# Patient Record
Sex: Female | Born: 1970
Health system: Southern US, Community
[De-identification: ages and names within clinical notes are randomized; demographics above are authoritative.]

## PROBLEM LIST (undated history)

## (undated) DIAGNOSIS — B20 Human immunodeficiency virus [HIV] disease: Secondary | ICD-10-CM

## (undated) DIAGNOSIS — F909 Attention-deficit hyperactivity disorder, unspecified type: Secondary | ICD-10-CM

## (undated) DIAGNOSIS — G2581 Restless legs syndrome: Secondary | ICD-10-CM

## (undated) DIAGNOSIS — M797 Fibromyalgia: Secondary | ICD-10-CM

## (undated) DIAGNOSIS — R519 Headache, unspecified: Secondary | ICD-10-CM

## (undated) DIAGNOSIS — T7840XA Allergy, unspecified, initial encounter: Secondary | ICD-10-CM

## (undated) DIAGNOSIS — E079 Disorder of thyroid, unspecified: Secondary | ICD-10-CM

## (undated) DIAGNOSIS — E559 Vitamin D deficiency, unspecified: Secondary | ICD-10-CM

## (undated) DIAGNOSIS — E039 Hypothyroidism, unspecified: Secondary | ICD-10-CM

## (undated) DIAGNOSIS — G40909 Epilepsy, unspecified, not intractable, without status epilepticus: Secondary | ICD-10-CM

## (undated) DIAGNOSIS — F329 Major depressive disorder, single episode, unspecified: Secondary | ICD-10-CM

## (undated) DIAGNOSIS — F4310 Post-traumatic stress disorder, unspecified: Secondary | ICD-10-CM

## (undated) DIAGNOSIS — G4700 Insomnia, unspecified: Secondary | ICD-10-CM

## (undated) DIAGNOSIS — F331 Major depressive disorder, recurrent, moderate: Secondary | ICD-10-CM

## (undated) DIAGNOSIS — I10 Essential (primary) hypertension: Secondary | ICD-10-CM

## (undated) DIAGNOSIS — F32A Depression, unspecified: Secondary | ICD-10-CM

## (undated) DIAGNOSIS — Z21 Asymptomatic human immunodeficiency virus [HIV] infection status: Secondary | ICD-10-CM

## (undated) DIAGNOSIS — D649 Anemia, unspecified: Secondary | ICD-10-CM

## (undated) DIAGNOSIS — F419 Anxiety disorder, unspecified: Secondary | ICD-10-CM

## (undated) DIAGNOSIS — G43109 Migraine with aura, not intractable, without status migrainosus: Secondary | ICD-10-CM

## (undated) DIAGNOSIS — R51 Headache: Secondary | ICD-10-CM

## (undated) DIAGNOSIS — F411 Generalized anxiety disorder: Secondary | ICD-10-CM

## (undated) DIAGNOSIS — E119 Type 2 diabetes mellitus without complications: Secondary | ICD-10-CM

## (undated) HISTORY — DX: Anemia, unspecified: D64.9

## (undated) HISTORY — DX: Generalized anxiety disorder: F41.1

## (undated) HISTORY — DX: Attention-deficit hyperactivity disorder, unspecified type: F90.9

## (undated) HISTORY — DX: Fibromyalgia: M79.7

## (undated) HISTORY — DX: Major depressive disorder, recurrent, moderate: F33.1

## (undated) HISTORY — DX: Headache: R51

## (undated) HISTORY — DX: Vitamin D deficiency, unspecified: E55.9

## (undated) HISTORY — PX: EYE SURGERY: SHX253

## (undated) HISTORY — DX: Restless legs syndrome: G25.81

## (undated) HISTORY — DX: Depression, unspecified: F32.A

## (undated) HISTORY — DX: Type 2 diabetes mellitus without complications: E11.9

## (undated) HISTORY — DX: Human immunodeficiency virus (HIV) disease: B20

## (undated) HISTORY — DX: Disorder of thyroid, unspecified: E07.9

## (undated) HISTORY — DX: Insomnia, unspecified: G47.00

## (undated) HISTORY — DX: Major depressive disorder, single episode, unspecified: F32.9

## (undated) HISTORY — DX: Asymptomatic human immunodeficiency virus (hiv) infection status: Z21

## (undated) HISTORY — DX: Migraine with aura, not intractable, without status migrainosus: G43.109

## (undated) HISTORY — DX: Essential (primary) hypertension: I10

## (undated) HISTORY — DX: Anxiety disorder, unspecified: F41.9

## (undated) HISTORY — DX: Epilepsy, unspecified, not intractable, without status epilepticus: G40.909

## (undated) HISTORY — DX: Headache, unspecified: R51.9

## (undated) HISTORY — PX: TUBAL LIGATION: SHX77

## (undated) HISTORY — DX: Allergy, unspecified, initial encounter: T78.40XA

## (undated) HISTORY — DX: Post-traumatic stress disorder, unspecified: F43.10

---

## 2006-07-26 ENCOUNTER — Observation Stay: Payer: Self-pay | Admitting: Obstetrics and Gynecology

## 2006-08-01 ENCOUNTER — Observation Stay: Payer: Self-pay | Admitting: Obstetrics and Gynecology

## 2006-08-08 ENCOUNTER — Observation Stay: Payer: Self-pay | Admitting: Obstetrics and Gynecology

## 2006-08-16 ENCOUNTER — Observation Stay: Payer: Self-pay | Admitting: Obstetrics and Gynecology

## 2006-08-22 ENCOUNTER — Observation Stay: Payer: Self-pay | Admitting: Obstetrics and Gynecology

## 2006-08-27 ENCOUNTER — Inpatient Hospital Stay: Payer: Self-pay | Admitting: Obstetrics and Gynecology

## 2007-09-12 ENCOUNTER — Other Ambulatory Visit: Payer: Self-pay

## 2007-09-12 ENCOUNTER — Emergency Department: Payer: Self-pay | Admitting: Unknown Physician Specialty

## 2009-12-25 ENCOUNTER — Emergency Department: Payer: Self-pay | Admitting: Emergency Medicine

## 2010-02-08 ENCOUNTER — Emergency Department: Payer: Self-pay | Admitting: Emergency Medicine

## 2010-03-21 ENCOUNTER — Ambulatory Visit: Payer: Self-pay | Admitting: Family Medicine

## 2010-10-31 ENCOUNTER — Ambulatory Visit: Payer: Self-pay | Admitting: Family Medicine

## 2010-10-31 LAB — HM MAMMOGRAPHY: HM Mammogram: NORMAL

## 2011-01-28 ENCOUNTER — Emergency Department: Payer: Self-pay | Admitting: Internal Medicine

## 2012-10-29 ENCOUNTER — Emergency Department: Payer: Self-pay | Admitting: Emergency Medicine

## 2012-11-19 ENCOUNTER — Emergency Department: Payer: Self-pay | Admitting: Emergency Medicine

## 2012-11-19 LAB — COMPREHENSIVE METABOLIC PANEL
Alkaline Phosphatase: 133 U/L (ref 50–136)
Anion Gap: 6 — ABNORMAL LOW (ref 7–16)
Chloride: 104 mmol/L (ref 98–107)
Creatinine: 0.62 mg/dL (ref 0.60–1.30)
EGFR (African American): >60
EGFR (Non-African Amer.): >60
Glucose: 132 mg/dL — ABNORMAL HIGH (ref 65–99)
Osmolality: 276 (ref 275–301)
Potassium: 3.4 mmol/L — ABNORMAL LOW (ref 3.5–5.1)
SGPT (ALT): 30 U/L (ref 12–78)

## 2012-11-19 LAB — CBC
MCV: 80 fL (ref 80–100)
Platelet: 272 10*3/uL (ref 150–440)
RBC: 3.94 10*6/uL (ref 3.80–5.20)
RDW: 16.9 % — ABNORMAL HIGH (ref 11.5–14.5)
WBC: 8.4 10*3/uL (ref 3.6–11.0)

## 2012-11-19 LAB — PRO B NATRIURETIC PEPTIDE: B-Type Natriuretic Peptide: 89 pg/mL (ref 0–125)

## 2012-11-19 LAB — CK TOTAL AND CKMB (NOT AT ARMC): CK, Total: 85 U/L (ref 21–215)

## 2013-01-14 ENCOUNTER — Other Ambulatory Visit: Payer: Self-pay | Admitting: Neonatology

## 2013-01-14 ENCOUNTER — Other Ambulatory Visit (HOSPITAL_COMMUNITY): Payer: Self-pay | Admitting: Rheumatology

## 2013-01-14 DIAGNOSIS — M2550 Pain in unspecified joint: Secondary | ICD-10-CM

## 2013-01-14 DIAGNOSIS — M79671 Pain in right foot: Secondary | ICD-10-CM

## 2013-01-14 DIAGNOSIS — M25549 Pain in joints of unspecified hand: Secondary | ICD-10-CM

## 2013-01-15 ENCOUNTER — Telehealth: Payer: Self-pay | Admitting: Oncology

## 2013-01-15 NOTE — Telephone Encounter (Signed)
LVOM FOR PT TO RETURN CALL.  °

## 2013-01-21 ENCOUNTER — Telehealth: Payer: Self-pay | Admitting: Oncology

## 2013-01-21 NOTE — Telephone Encounter (Signed)
S/w pt in re NP appt 05/21 @ 3 w/Dr. Gaylyn Rong Referring Dr. Pollyann Savoy Dx- Elevated ESR Welcome packet mailed.

## 2013-01-26 ENCOUNTER — Encounter (HOSPITAL_COMMUNITY)
Admission: RE | Admit: 2013-01-26 | Discharge: 2013-01-26 | Disposition: A | Discharge status: Home / Self Care | Payer: BC Managed Care – PPO | Source: Ambulatory Visit | Attending: Rheumatology | Admitting: Rheumatology

## 2013-01-26 DIAGNOSIS — M79609 Pain in unspecified limb: Secondary | ICD-10-CM | POA: Insufficient documentation

## 2013-01-26 DIAGNOSIS — M79671 Pain in right foot: Secondary | ICD-10-CM

## 2013-01-26 DIAGNOSIS — M2550 Pain in unspecified joint: Secondary | ICD-10-CM

## 2013-01-26 MED ORDER — TECHNETIUM TC 99M MEDRONATE IV KIT
24.0000 | PACK | Freq: Once | INTRAVENOUS | Status: AC | PRN
  Administered 2013-01-26: 24 via INTRAVENOUS

## 2013-01-27 ENCOUNTER — Telehealth: Payer: Self-pay | Admitting: Oncology

## 2013-01-27 NOTE — Telephone Encounter (Signed)
C/D 01/27/13 for appt. 02/11/13 °

## 2013-02-10 ENCOUNTER — Telehealth: Payer: Self-pay | Admitting: Oncology

## 2013-02-10 NOTE — Telephone Encounter (Signed)
LVOM FOR PT TO RETURN CALL TO CONFIRM MESSAGE WAS RECEIVED IN RE TO NP APPT R/S.

## 2013-02-11 ENCOUNTER — Ambulatory Visit: Payer: BC Managed Care – PPO

## 2013-02-11 ENCOUNTER — Ambulatory Visit: Payer: BC Managed Care – PPO | Admitting: Oncology

## 2013-02-11 ENCOUNTER — Other Ambulatory Visit: Payer: BC Managed Care – PPO | Admitting: Lab

## 2013-02-12 ENCOUNTER — Telehealth: Payer: Self-pay | Admitting: Oncology

## 2013-02-12 NOTE — Telephone Encounter (Signed)
S/W PT IN RE TO APPT D/T CHANGE TO 06/18 @ 1:30. NEW CALENDAR MAIELD.

## 2013-03-06 ENCOUNTER — Other Ambulatory Visit: Payer: Self-pay | Admitting: Oncology

## 2013-03-06 DIAGNOSIS — D729 Disorder of white blood cells, unspecified: Secondary | ICD-10-CM

## 2013-03-11 ENCOUNTER — Encounter: Payer: Self-pay | Admitting: Oncology

## 2013-03-11 ENCOUNTER — Other Ambulatory Visit (HOSPITAL_BASED_OUTPATIENT_CLINIC_OR_DEPARTMENT_OTHER): Payer: BC Managed Care – PPO | Admitting: Lab

## 2013-03-11 ENCOUNTER — Ambulatory Visit (HOSPITAL_BASED_OUTPATIENT_CLINIC_OR_DEPARTMENT_OTHER): Payer: BC Managed Care – PPO | Admitting: Oncology

## 2013-03-11 ENCOUNTER — Ambulatory Visit: Payer: BC Managed Care – PPO

## 2013-03-11 VITALS — BP 146/88 | HR 94 | Temp 99.6°F | Resp 20 | Ht 62.0 in | Wt 233.9 lb

## 2013-03-11 DIAGNOSIS — D539 Nutritional anemia, unspecified: Secondary | ICD-10-CM

## 2013-03-11 DIAGNOSIS — D509 Iron deficiency anemia, unspecified: Secondary | ICD-10-CM

## 2013-03-11 DIAGNOSIS — D729 Disorder of white blood cells, unspecified: Secondary | ICD-10-CM

## 2013-03-11 DIAGNOSIS — R700 Elevated erythrocyte sedimentation rate: Secondary | ICD-10-CM

## 2013-03-11 LAB — CBC WITH DIFFERENTIAL/PLATELET
BASO%: 1.3 % (ref 0.0–2.0)
Basophils Absolute: 0.1 10*3/uL (ref 0.0–0.1)
EOS%: 1.4 % (ref 0.0–7.0)
HCT: 29.4 % — ABNORMAL LOW (ref 34.8–46.6)
HGB: 9.6 g/dL — ABNORMAL LOW (ref 11.6–15.9)
MCH: 24.6 pg — ABNORMAL LOW (ref 25.1–34.0)
MONO#: 0.5 10*3/uL (ref 0.1–0.9)
NEUT%: 57.8 % (ref 38.4–76.8)
RDW: 16.6 % — ABNORMAL HIGH (ref 11.2–14.5)
WBC: 8.1 10*3/uL (ref 3.9–10.3)
lymph#: 2.7 10*3/uL (ref 0.9–3.3)

## 2013-03-11 LAB — COMPREHENSIVE METABOLIC PANEL (CC13)
ALT: 12 U/L (ref 0–55)
AST: 12 U/L (ref 5–34)
Albumin: 3.3 g/dL — ABNORMAL LOW (ref 3.5–5.0)
BUN: 14.3 mg/dL (ref 7.0–26.0)
CO2: 27 mEq/L (ref 22–29)
Calcium: 9.4 mg/dL (ref 8.4–10.4)
Chloride: 100 mEq/L (ref 98–107)
Creatinine: 0.8 mg/dL (ref 0.6–1.1)
Potassium: 3.8 mEq/L (ref 3.5–5.1)

## 2013-03-11 LAB — IRON AND TIBC
%SAT: 6 % — ABNORMAL LOW (ref 20–55)
Iron: 25 ug/dL — ABNORMAL LOW (ref 42–145)
TIBC: 435 ug/dL (ref 250–470)
UIBC: 410 ug/dL — ABNORMAL HIGH (ref 125–400)

## 2013-03-11 LAB — FERRITIN: Ferritin: 5 ng/mL — ABNORMAL LOW (ref 10–291)

## 2013-03-11 NOTE — Progress Notes (Signed)
Checked in new pt with no financial concerns. °

## 2013-03-11 NOTE — Progress Notes (Signed)
Mailed updated medication list to patient's home 

## 2013-03-11 NOTE — Progress Notes (Signed)
Note dictated

## 2013-03-12 NOTE — Progress Notes (Signed)
CC:   Heather Jefferson, M.D. Heather Jefferson, M.D.  REASON FOR CONSULTATION:  Anemia and elevated sed rate.  HISTORY OF PRESENT ILLNESS:  This is a pleasant 42 year old woman native of [place], currently resides in [place] and has done so for the last few years.  She is a pleasant woman with a past medical history significant for hypertension, anxiety, mild obesity.  She gets her routine medical care with her primary care physician.  She, however, developed joint pain, predominantly in her elbows, arms, hands and shoulder and was evaluated by a Dr. Pollyann Jefferson from [facility] and had an extensive workup that included a bone scan which showed inflammatory uptake in her hands.  She also had an extensive laboratory data that includes a CBC that showed a hemoglobin of 9.9, her hematocrit was 30, MCV was 76, RDW was 16.4.  Her white cell count was normal at 7.6, platelet count normal at 363.  Her MCV was low, and her RDW was elevated.  Her sed rate level was 71, which is elevated. Chemistry including mildly elevated alkaline phosphatase of 121.  Normal liver function tests.  She had normal albumin.  Normal total protein. She had a quantitative immunoglobulins, all within normal range.  Her M- spike was not detected.  The patient was referred to me for evaluation of her anemia and elevated sed rate.  Clinically, she does report quite a bit of fatigue but no chest pain or shortness of breath.  She is not reporting any hematochezia or melena, but she does report heavy menses and has done so for many months.  She has been on iron replacement, although had been erratic in nature and has not necessarily helped her iron.  She does report some dyspepsia and some constipation associated with iron replacement.  She had not reported any other form of bleeding. She had not had any ice cravings.  Had not had any opportunistic infections.  Had not had any constitutional  symptoms.  REVIEW OF SYSTEMS:  A comprehensive review of system was otherwise unremarkable.  PAST MEDICAL HISTORY:  Significant for hypertension, anxiety, ADHD, history of anemia.  PAST SURGICAL HISTORY:  No other surgeries.  MEDICATION:  She is on the spironolactone with hydrochlorothiazide combination 25/12.5.  She is on an Adderall XR.  She is on metoprolol, Zanaflex, and vitamin D supplements.  ALLERGIES:  None.  SOCIAL HISTORY:  She has 2 children.  Denied any alcohol abuse.  She works as a Associate Professor.  FAMILY HISTORY:  Her father had G6PD, mild anemia, and a history of diabetes.  Mother had heart disease and hypertension.  PHYSICAL EXAMINATION:  General:  Alert, awake, pleasant woman, appeared in no active distress today.  Vital signs:  Her blood pressure is 146/88, pulse 94, respirations 20, temperature 99.6.  HEENT:  Head is normocephalic, atraumatic.  Pupils equal, round, reactive to light. Oral mucosa moist and pink.  Neck:  Supple.  No lymphadenopathy.  Heart: Regular rate and rhythm.  S1, S2.  Lungs:  Clear to auscultation.  No rhonchi, wheezes, dullness to percussion.  Abdomen:  Soft, nontender. No hepatosplenomegaly.  Extremities:  No clubbing, cyanosis, or edema. Neurologic:  Intact motor, sensory, and deep tendon reflexes.  LABORATORY DATA:  White cell count of 8.0, hemoglobin of 9.6, MCV 75, platelet count elevated at 412. Differential was perfectly normal. Smear showed microcytosis and hypochromia.  ASSESSMENT AND PLAN:  A 42 year old woman with the following issues: 1. Microcytic hypochromic anemia associated with elevated  RDW.  The     differential diagnosis was discussed today.  This most likely     represents iron-deficiency anemia due to menorrhagia.  It is     certainly possible that she could have a hemoglobinopathy such as     sickle cell or thalassemia.  It is also unlikely that she has     hemodialysis from G6PD at this time.  Management  options were     discussed today with the patient.  That would include oral p.o.     iron replacement versus IV iron.  Risks and benefits of both     approaches were discussed.  Complications of IV iron were     discussed, including infusion-related toxicity, arthralgias,     myalgias, and rarely anaphylaxis.  At this point, she would like to     think about it and let me know.  In the meantime, I urged her to     continue the oral iron supplementation. 2. Elevated sedimentation rate.  I do not think this is related to a     hematological condition.  A monoclonal spike was not detected.  Her     quantitative immunoglobulins were all within normal range, and I     doubt she has a plasma cell disorder that can explain the elevation     in sedimentation rate and her joint pain.  This is most likely     inflammatory arthritis causing the elevation in sedimentation rate     rather than a hematological condition, and I will defer that to Dr.     Corliss Skains for management.  FOLLOWUP:  She will follow up in 3 months' time to recheck her iron stores regardless of what modality she chooses to do.  All of her questions were answered today.    ______________________________ Benjiman Core, M.D. FNS/MEDQ  D:  03/11/2013  T:  03/12/2013  Job:  [id number]

## 2013-03-13 LAB — SPEP & IFE WITH QIG
Alpha-1-Globulin: 7 % — ABNORMAL HIGH (ref 2.9–4.9)
Alpha-2-Globulin: 13.8 % — ABNORMAL HIGH (ref 7.1–11.8)
Beta 2: 7.1 % — ABNORMAL HIGH (ref 3.2–6.5)
Gamma Globulin: 15 % (ref 11.1–18.8)
IgG (Immunoglobin G), Serum: 1150 mg/dL (ref 690–1700)

## 2013-03-13 LAB — SEDIMENTATION RATE: Sed Rate: 116 mm/hr — ABNORMAL HIGH (ref 0–22)

## 2013-04-22 ENCOUNTER — Other Ambulatory Visit: Payer: Self-pay | Admitting: Oncology

## 2013-04-28 ENCOUNTER — Telehealth: Payer: Self-pay | Admitting: Oncology

## 2013-04-28 NOTE — Telephone Encounter (Signed)
pt lvm to cx appts and she wil call back to r/s appts

## 2013-04-29 ENCOUNTER — Ambulatory Visit: Payer: BC Managed Care – PPO

## 2013-06-11 ENCOUNTER — Other Ambulatory Visit: Payer: BC Managed Care – PPO | Admitting: Lab

## 2013-06-11 ENCOUNTER — Ambulatory Visit: Payer: BC Managed Care – PPO | Admitting: Oncology

## 2013-07-03 ENCOUNTER — Emergency Department: Payer: Self-pay | Admitting: Internal Medicine

## 2013-07-15 ENCOUNTER — Ambulatory Visit: Payer: Self-pay | Admitting: Internal Medicine

## 2013-07-15 LAB — CBC CANCER CENTER
Basophil: 2 %
HCT: 31.8 % — ABNORMAL LOW (ref 35.0–47.0)
MCHC: 32.2 g/dL (ref 32.0–36.0)
MCV: 77 fL — ABNORMAL LOW (ref 80–100)
Platelet: 402 x10 3/mm (ref 150–440)
RBC: 4.12 10*6/uL (ref 3.80–5.20)
WBC: 8.6 x10 3/mm (ref 3.6–11.0)

## 2013-07-15 LAB — FERRITIN: Ferritin (ARMC): 8 ng/mL (ref 8–388)

## 2013-07-15 LAB — IRON AND TIBC
Iron Bind.Cap.(Total): 451 ug/dL — ABNORMAL HIGH (ref 250–450)
Iron Saturation: 10 %
Unbound Iron-Bind.Cap.: 405 ug/dL

## 2013-07-15 LAB — FOLATE: Folic Acid: 15.6 ng/mL (ref 3.1–100.0)

## 2013-07-15 LAB — RETICULOCYTES: Absolute Retic Count: 0.106 10*6/uL (ref 0.019–0.186)

## 2013-07-24 LAB — OCCULT BLOOD X 1 CARD TO LAB, STOOL: Occult Blood, Feces: NEGATIVE

## 2013-07-25 ENCOUNTER — Ambulatory Visit: Payer: Self-pay | Admitting: Internal Medicine

## 2013-09-16 LAB — HM PAP SMEAR: HM PAP: NORMAL

## 2013-10-07 ENCOUNTER — Ambulatory Visit: Payer: Self-pay | Admitting: Internal Medicine

## 2013-10-07 LAB — CBC CANCER CENTER
BASOS PCT: 2 %
Basophil #: 0.1 x10 3/mm (ref 0.0–0.1)
EOS PCT: 1.3 %
Eosinophil #: 0.1 x10 3/mm (ref 0.0–0.7)
HCT: 40.1 % (ref 35.0–47.0)
HGB: 13.4 g/dL (ref 12.0–16.0)
Lymphocyte #: 2.2 x10 3/mm (ref 1.0–3.6)
Lymphocyte %: 33.8 %
MCH: 29 pg (ref 26.0–34.0)
MCHC: 33.5 g/dL (ref 32.0–36.0)
MCV: 86 fL (ref 80–100)
MONOS PCT: 7.7 %
Monocyte #: 0.5 x10 3/mm (ref 0.2–0.9)
NEUTROS PCT: 55.2 %
Neutrophil #: 3.6 x10 3/mm (ref 1.4–6.5)
Platelet: 350 x10 3/mm (ref 150–440)
RBC: 4.64 10*6/uL (ref 3.80–5.20)
RDW: 16.6 % — AB (ref 11.5–14.5)
WBC: 6.4 x10 3/mm (ref 3.6–11.0)

## 2013-10-07 LAB — IRON AND TIBC
IRON SATURATION: 15 %
Iron Bind.Cap.(Total): 331 ug/dL (ref 250–450)
Iron: 49 ug/dL — ABNORMAL LOW (ref 50–170)
Unbound Iron-Bind.Cap.: 282 ug/dL

## 2013-10-07 LAB — FERRITIN: Ferritin (ARMC): 40 ng/mL (ref 8–388)

## 2013-10-25 ENCOUNTER — Ambulatory Visit: Payer: Self-pay | Admitting: Internal Medicine

## 2014-02-17 ENCOUNTER — Ambulatory Visit: Payer: Self-pay | Admitting: Internal Medicine

## 2014-02-17 LAB — IRON AND TIBC
IRON SATURATION: 12 %
Iron Bind.Cap.(Total): 412 ug/dL (ref 250–450)
Iron: 50 ug/dL (ref 50–170)
Unbound Iron-Bind.Cap.: 362 ug/dL

## 2014-02-17 LAB — CANCER CENTER HEMOGLOBIN: HGB: 13.3 g/dL (ref 12.0–16.0)

## 2014-02-17 LAB — FERRITIN: Ferritin (ARMC): 14 ng/mL (ref 8–388)

## 2014-02-22 ENCOUNTER — Ambulatory Visit: Payer: Self-pay | Admitting: Internal Medicine

## 2014-03-24 ENCOUNTER — Ambulatory Visit: Payer: Self-pay | Admitting: Internal Medicine

## 2014-04-08 DIAGNOSIS — F988 Other specified behavioral and emotional disorders with onset usually occurring in childhood and adolescence: Secondary | ICD-10-CM | POA: Insufficient documentation

## 2014-04-08 DIAGNOSIS — F4312 Post-traumatic stress disorder, chronic: Secondary | ICD-10-CM | POA: Insufficient documentation

## 2014-04-08 DIAGNOSIS — F4310 Post-traumatic stress disorder, unspecified: Secondary | ICD-10-CM

## 2014-05-17 ENCOUNTER — Ambulatory Visit: Payer: Self-pay | Admitting: Neurology

## 2014-06-04 ENCOUNTER — Ambulatory Visit: Payer: Self-pay | Admitting: Internal Medicine

## 2014-07-12 ENCOUNTER — Ambulatory Visit: Payer: Self-pay | Admitting: Internal Medicine

## 2014-07-12 LAB — IRON AND TIBC
IRON: 97 ug/dL (ref 50–170)
Iron Bind.Cap.(Total): 344 ug/dL (ref 250–450)
Iron Saturation: 28 %
UNBOUND IRON-BIND. CAP.: 247 ug/dL

## 2014-07-12 LAB — FERRITIN: Ferritin (ARMC): 32 ng/mL (ref 8–388)

## 2014-07-25 ENCOUNTER — Ambulatory Visit: Payer: Self-pay | Admitting: Internal Medicine

## 2014-10-08 ENCOUNTER — Ambulatory Visit: Payer: Self-pay | Admitting: Internal Medicine

## 2014-10-08 LAB — CBC CANCER CENTER
BASOS ABS: 0 x10 3/mm (ref 0.0–0.1)
Basophil %: 0.6 %
Eosinophil #: 0.1 x10 3/mm (ref 0.0–0.7)
Eosinophil %: 1.4 %
HCT: 35.3 % (ref 35.0–47.0)
HGB: 11.9 g/dL — ABNORMAL LOW (ref 12.0–16.0)
LYMPHS ABS: 2.6 x10 3/mm (ref 1.0–3.6)
LYMPHS PCT: 37.7 %
MCH: 29.2 pg (ref 26.0–34.0)
MCHC: 33.5 g/dL (ref 32.0–36.0)
MCV: 87 fL (ref 80–100)
MONOS PCT: 7.5 %
Monocyte #: 0.5 x10 3/mm (ref 0.2–0.9)
Neutrophil #: 3.6 x10 3/mm (ref 1.4–6.5)
Neutrophil %: 52.8 %
Platelet: 280 x10 3/mm (ref 150–440)
RBC: 4.05 10*6/uL (ref 3.80–5.20)
RDW: 14.1 % (ref 11.5–14.5)
WBC: 6.9 x10 3/mm (ref 3.6–11.0)

## 2014-10-08 LAB — IRON AND TIBC
Iron Bind.Cap.(Total): 353 ug/dL (ref 250–450)
Iron Saturation: 13 %
Iron: 46 ug/dL — ABNORMAL LOW (ref 50–170)
Unbound Iron-Bind.Cap.: 307 ug/dL

## 2014-10-08 LAB — FERRITIN: Ferritin (ARMC): 21 ng/mL (ref 8–388)

## 2014-10-25 ENCOUNTER — Ambulatory Visit: Payer: Self-pay | Admitting: Internal Medicine

## 2014-11-23 ENCOUNTER — Emergency Department: Payer: Self-pay | Admitting: Emergency Medicine

## 2015-01-10 ENCOUNTER — Ambulatory Visit
Admit: 2015-01-10 | Disposition: A | Discharge status: Home / Self Care | Payer: Self-pay | Attending: Internal Medicine | Admitting: Internal Medicine

## 2015-01-10 LAB — CBC CANCER CENTER
Basophil #: 0.1 x10 3/mm (ref 0.0–0.1)
Basophil %: 1.1 %
EOS PCT: 0.5 %
Eosinophil #: 0 x10 3/mm (ref 0.0–0.7)
HCT: 39.6 % (ref 35.0–47.0)
HGB: 13.5 g/dL (ref 12.0–16.0)
LYMPHS ABS: 2.9 x10 3/mm (ref 1.0–3.6)
LYMPHS PCT: 35.2 %
MCH: 30.6 pg (ref 26.0–34.0)
MCHC: 34.1 g/dL (ref 32.0–36.0)
MCV: 90 fL (ref 80–100)
Monocyte #: 0.5 x10 3/mm (ref 0.2–0.9)
Monocyte %: 6.5 %
NEUTROS ABS: 4.6 x10 3/mm (ref 1.4–6.5)
Neutrophil %: 56.7 %
Platelet: 323 x10 3/mm (ref 150–440)
RBC: 4.42 10*6/uL (ref 3.80–5.20)
RDW: 13.6 % (ref 11.5–14.5)
WBC: 8.2 x10 3/mm (ref 3.6–11.0)

## 2015-01-10 LAB — FERRITIN: Ferritin (ARMC): 69 ng/mL

## 2015-01-10 LAB — IRON AND TIBC
IRON BIND. CAP.(TOTAL): 342 (ref 250–450)
Iron Saturation: 26.6
Iron: 91 ug/dL
Unbound Iron-Bind.Cap.: 251.2

## 2015-01-12 LAB — LIPID PANEL
Cholesterol: 221 mg/dL — AB (ref 0–200)
HDL: 62 mg/dL (ref 35–70)
LDL Cholesterol: 135 mg/dL
Triglycerides: 118 mg/dL (ref 40–160)

## 2015-01-12 LAB — HEMOGLOBIN A1C: HEMOGLOBIN A1C: 5.6 % (ref 4.0–6.0)

## 2015-03-15 ENCOUNTER — Encounter: Payer: Self-pay | Admitting: Psychiatry

## 2015-03-15 ENCOUNTER — Other Ambulatory Visit: Payer: Self-pay

## 2015-03-15 ENCOUNTER — Ambulatory Visit (INDEPENDENT_AMBULATORY_CARE_PROVIDER_SITE_OTHER): Payer: BLUE CROSS/BLUE SHIELD | Admitting: Psychiatry

## 2015-03-15 VITALS — BP 128/84 | HR 74 | Temp 99.1°F | Ht 62.0 in

## 2015-03-15 DIAGNOSIS — F988 Other specified behavioral and emotional disorders with onset usually occurring in childhood and adolescence: Secondary | ICD-10-CM | POA: Insufficient documentation

## 2015-03-15 DIAGNOSIS — I7300 Raynaud's syndrome without gangrene: Secondary | ICD-10-CM | POA: Insufficient documentation

## 2015-03-15 DIAGNOSIS — F331 Major depressive disorder, recurrent, moderate: Secondary | ICD-10-CM

## 2015-03-15 DIAGNOSIS — Z8619 Personal history of other infectious and parasitic diseases: Secondary | ICD-10-CM | POA: Insufficient documentation

## 2015-03-15 DIAGNOSIS — G4730 Sleep apnea, unspecified: Secondary | ICD-10-CM | POA: Insufficient documentation

## 2015-03-15 DIAGNOSIS — R5383 Other fatigue: Secondary | ICD-10-CM | POA: Insufficient documentation

## 2015-03-15 DIAGNOSIS — F411 Generalized anxiety disorder: Secondary | ICD-10-CM | POA: Insufficient documentation

## 2015-03-15 DIAGNOSIS — G2581 Restless legs syndrome: Secondary | ICD-10-CM | POA: Insufficient documentation

## 2015-03-15 DIAGNOSIS — M069 Rheumatoid arthritis, unspecified: Secondary | ICD-10-CM | POA: Insufficient documentation

## 2015-03-15 DIAGNOSIS — E560 Deficiency of vitamin E: Secondary | ICD-10-CM | POA: Insufficient documentation

## 2015-03-15 DIAGNOSIS — M797 Fibromyalgia: Secondary | ICD-10-CM | POA: Insufficient documentation

## 2015-03-15 DIAGNOSIS — F458 Other somatoform disorders: Secondary | ICD-10-CM | POA: Insufficient documentation

## 2015-03-15 DIAGNOSIS — K219 Gastro-esophageal reflux disease without esophagitis: Secondary | ICD-10-CM | POA: Insufficient documentation

## 2015-03-15 DIAGNOSIS — G4700 Insomnia, unspecified: Secondary | ICD-10-CM | POA: Insufficient documentation

## 2015-03-15 MED ORDER — DESVENLAFAXINE SUCCINATE ER 100 MG PO TB24: 100.0000 mg | ORAL_TABLET | Freq: Every day | ORAL | Status: DC

## 2015-03-15 MED ORDER — PRAZOSIN HCL 2 MG PO CAPS: 2.0000 mg | ORAL_CAPSULE | Freq: Every day | ORAL | Status: DC

## 2015-03-15 MED ORDER — ARIPIPRAZOLE 2 MG PO TABS: 2.0000 mg | ORAL_TABLET | Freq: Every day | ORAL | Status: DC

## 2015-03-15 MED ORDER — AMPHETAMINE-DEXTROAMPHET ER 10 MG PO CP24: 10.0000 mg | ORAL_CAPSULE | ORAL | Status: DC

## 2015-03-15 NOTE — Progress Notes (Signed)
BH MD/PA/NP OP Progress Note  03/15/2015 8:55 AM Heather Jefferson  MRN:  [id number]  Subjective:    Patient is a 44 year old female who presented for the follow-up appointment. She reported that she has been compliant with her medications. She is spending time with her kids who are enjoying their summer break. She reported that her 68-year-old daughter is planning to go to [place] with her godmother. She is anxious about the same. Patient reported that she is overwhelmed as her kids do not clean the house and she has severe anxiety related to the same. She spent most of the time cleaning the house. Patient wakes up with a severe anxiety due to the thoughts that she has to clean the house on a daily basis. She reported that she used to do well on the Adderall in the past which was prescribed by Dr. Annitta Jersey as it helps with focusing and with   chores around the house. She wants to be restarted back on the Adderall at this time. She currently denied having any adverse effects of the medications. She currently denied having any suicidal ideations or plans.  Chief Complaint:  Chief Complaint    Follow-up; Depression     Visit Diagnosis:     ICD-9-CM ICD-10-CM   1. MDD (major depressive disorder), recurrent episode, moderate 296.32 F33.1     Past Medical History:  Past Medical History  Diagnosis Date  . Anxiety   . Depression   . Hypertension   . Thyroid disease   . PTSD (post-traumatic stress disorder)   . Headache   . Generalized anxiety disorder   . Major depressive disorder, recurrent episode, moderate   . Seizure disorder   . Persistent disorder of initiating or maintaining sleep   . ADHD (attention deficit hyperactivity disorder)    History reviewed. No pertinent past surgical history. Family History:  Family History  Problem Relation Age of Onset  . Hypertension Mother   . Heart attack Mother   . Diabetes Father   . Hypertension Sister   . Anxiety disorder Brother   .  Depression Brother    Social History:  History   Social History  . Marital Status: Married    Spouse Name: N/A  . Number of Children: N/A  . Years of Education: N/A   Social History Main Topics  . Smoking status: Never Smoker   . Smokeless tobacco: Never Used  . Alcohol Use: 0.0 oz/week    0 Standard drinks or equivalent per week     Comment: social drinker  . Drug Use: No  . Sexual Activity: Yes    Birth Control/ Protection: None   Other Topics Concern  . None   Social History Narrative  . None   Additional History:  She currently lives with her husband and 2 children ages 64 and 38 years old. She has good relationship with them.  Assessment:   Musculoskeletal: Strength & Muscle Tone: within normal limits Gait & Station: normal Patient leans: N/A  Psychiatric Specialty Exam: HPI  ROS  Blood pressure 128/84, pulse 74, temperature 99.1 F (37.3 C), temperature source Tympanic, height 5\' 2"  (1.575 m), last menstrual period 02/27/2015, SpO2 97 %.There is no weight on file to calculate BMI.  General Appearance: Casual  Eye Contact:  Fair  Speech:  Clear and Coherent  Volume:  Normal  Mood:  Anxious and Depressed  Affect:  Congruent  Thought Process:  Logical  Orientation:  Full (Time, Place, and Person)  Thought  Content:  WDL  Suicidal Thoughts:  No  Homicidal Thoughts:  No  Memory:  Immediate;   Fair  Judgement:  Fair  Insight:  Fair  Psychomotor Activity:  Decreased  Concentration:  Fair  Recall:  [employer] of Knowledge: Fair  Language: Fair  Akathisia:  No  Handed:  Right  AIMS (if indicated):  none  Assets:  Communication Skills Desire for Improvement Physical Health  ADL's:  Intact  Cognition: WNL  Sleep:  6-8   Is the patient at risk to self?  No. Has the patient been a risk to self in the past 6 months?  No. Has the patient been a risk to self within the distant past?  No. Is the patient a risk to others?  No. Has the patient been a risk  to others in the past 6 months?  No. Has the patient been a risk to others within the distant past?  No.  Current Medications: Current Outpatient Prescriptions  Medication Sig Dispense Refill  . ARIPiprazole (ABILIFY) 2 MG tablet     . clonazePAM (KLONOPIN) 0.5 MG tablet Take by mouth.    . desvenlafaxine (PRISTIQ) 100 MG 24 hr tablet Take 100 mg by mouth daily.    . hydroxychloroquine (PLAQUENIL) 200 MG tablet Take by mouth.    . prazosin (MINIPRESS) 2 MG capsule Take by mouth.    . spironolactone-hydrochlorothiazide (ALDACTAZIDE) 25-25 MG per tablet     . SUMAtriptan (IMITREX) 20 MG/ACT nasal spray     . Topiramate ER (TROKENDI XR) 50 MG CP24 Take by mouth.    . Vitamin D, Ergocalciferol, (DRISDOL) 50000 UNITS CAPS Take 50,000 Units by mouth.    Marland Kitchen amphetamine-dextroamphetamine (ADDERALL XR) 20 MG 24 hr capsule Take 20 mg by mouth every morning.    . butalbital-acetaminophen-caffeine (FIORICET WITH CODEINE) 50-325-40-30 MG per capsule Take by mouth.    . desvenlafaxine (PRISTIQ) 100 MG 24 hr tablet Take by mouth.    . diphenhydrAMINE (BENADRYL) 25 mg capsule Take by mouth.    . hydrochlorothiazide (HYDRODIURIL) 12.5 MG tablet Take 12.5 mg by mouth daily.    . hydroxychloroquine (PLAQUENIL) 200 MG tablet     . metoprolol (LOPRESSOR) 50 MG tablet Take 50 mg by mouth 2 (two) times daily.    Marland Kitchen rOPINIRole (REQUIP) 0.5 MG tablet Take by mouth.    . spironolactone (ALDACTONE) 25 MG tablet Take 25 mg by mouth daily.    . SUMAtriptan (IMITREX) 20 MG/ACT nasal spray Place into the nose.    Marland Kitchen tiZANidine (ZANAFLEX) 4 MG capsule Take 4 mg by mouth 3 (three) times daily.    Marland Kitchen topiramate (TOPAMAX) 100 MG tablet Take by mouth.    . Topiramate ER 50 MG CP24 Take by mouth.     No current facility-administered medications for this visit.    Medical Decision Making:  Established Problem, Stable/Improving (1), Review of Psycho-Social Stressors (1) and Review of Medication Regimen & Side Effects  (2)  Treatment Plan Summary:Medication management   Discussed with patient about her medications treatment risks benefits and alternatives. I will start her back on Adderall XR 10 mg daily. She will continue on Abilify Pristiq and prazosin as prescribed. Advised her about the adverse effects of the medications and she demonstrated understanding. She will follow-up in 2 months.   More than 50% of the time spent in psychoeducation, counseling and coordination of care.    This note was generated in part or whole with voice recognition software. Voice  regonition is usually quite accurate but there are transcription errors that can and very often do occur. I apologize for any typographical errors that were not detected and corrected.  Rainey Pines 03/15/2015, 8:55 AM

## 2015-04-01 ENCOUNTER — Other Ambulatory Visit: Payer: Self-pay

## 2015-04-01 DIAGNOSIS — Z862 Personal history of diseases of the blood and blood-forming organs and certain disorders involving the immune mechanism: Secondary | ICD-10-CM

## 2015-04-04 ENCOUNTER — Inpatient Hospital Stay: Payer: BLUE CROSS/BLUE SHIELD | Attending: Internal Medicine

## 2015-04-04 ENCOUNTER — Inpatient Hospital Stay (HOSPITAL_BASED_OUTPATIENT_CLINIC_OR_DEPARTMENT_OTHER): Payer: BLUE CROSS/BLUE SHIELD | Admitting: Internal Medicine

## 2015-04-04 VITALS — BP 115/75 | HR 69 | Temp 97.0°F | Resp 18 | Ht 62.0 in | Wt 241.0 lb

## 2015-04-04 DIAGNOSIS — I10 Essential (primary) hypertension: Secondary | ICD-10-CM | POA: Diagnosis not present

## 2015-04-04 DIAGNOSIS — Z862 Personal history of diseases of the blood and blood-forming organs and certain disorders involving the immune mechanism: Secondary | ICD-10-CM

## 2015-04-04 DIAGNOSIS — Z79899 Other long term (current) drug therapy: Secondary | ICD-10-CM | POA: Diagnosis not present

## 2015-04-04 DIAGNOSIS — D509 Iron deficiency anemia, unspecified: Secondary | ICD-10-CM | POA: Insufficient documentation

## 2015-04-04 LAB — CBC
HEMATOCRIT: 42.1 % (ref 35.0–47.0)
Hemoglobin: 14.2 g/dL (ref 12.0–16.0)
MCH: 29.9 pg (ref 26.0–34.0)
MCHC: 33.7 g/dL (ref 32.0–36.0)
MCV: 88.6 fL (ref 80.0–100.0)
PLATELETS: 334 10*3/uL (ref 150–440)
RBC: 4.75 MIL/uL (ref 3.80–5.20)
RDW: 13.4 % (ref 11.5–14.5)
WBC: 9.1 10*3/uL (ref 3.6–11.0)

## 2015-04-04 LAB — IRON AND TIBC
IRON: 74 ug/dL (ref 28–170)
Saturation Ratios: 21 % (ref 10.4–31.8)
TIBC: 352 ug/dL (ref 250–450)
UIBC: 278 ug/dL

## 2015-04-04 LAB — FERRITIN: Ferritin: 41 ng/mL (ref 11–307)

## 2015-04-12 NOTE — Progress Notes (Signed)
[place]  [phone] [phone]     ID: [id number] NHEM OB: 1971/04/04  MR#: [id number]  FKC#:[id number]  Patient Care Team: Steele Sizer, MD as PCP - General  CHIEF COMPLAINT/DIAGNOSIS:  Iron deficiency Anemia -  received Feraheme 1020 mg in Oct 2014, then repeat course in January 2016.       HISTORY OF PRESENT ILLNESS:  Patient returns for continued hematology evaluation, she was given course of IV iron in January 2016. Clinically states that chronic fatigue on exertion is baseline. She is following with rheumatologist for joint issues. No new dyspnea, orthopnea, chest pain, or palpitation. No bleeding symptoms except for regular menstrual periods. No new bone pains. Appetite steady.   REVIEW OF SYSTEMS:   ROS As in HPI above. In addition, no fever, chills or sweats. No new headaches or focal weakness.  No dizziness or palpitation. No abdominal pain, constipation, diarrhea, dysuria or hematuria. No new skin rash or bleeding symptoms. No new paresthesias in extremities.    PAST MEDICAL HISTORY: Reviewed. Past Medical History  Diagnosis Date  . Anxiety   . Depression   . Hypertension   . Thyroid disease   . PTSD (post-traumatic stress disorder)   . Headache   . Generalized anxiety disorder   . Major depressive disorder, recurrent episode, moderate   . Seizure disorder   . Persistent disorder of initiating or maintaining sleep   . ADHD (attention deficit hyperactivity disorder)    Seronegative inflammatory arthritis  PAST SURGICAL HISTORY: Reviewed. No past surgical history on file.  FAMILY HISTORY: Reviewed. Family History  Problem Relation Age of Onset  . Hypertension Mother   . Heart attack Mother   . Diabetes Father   . Hypertension Sister   . Anxiety disorder Brother   . Depression Brother     SOCIAL HISTORY: Reviewed. History  Substance Use Topics  . Smoking status: Never Smoker   . Smokeless tobacco: Never Used    . Alcohol Use: 0.0 oz/week    0 Standard drinks or equivalent per week     Comment: social drinker    No Known Allergies  Current Outpatient Prescriptions  Medication Sig Dispense Refill  . amphetamine-dextroamphetamine (ADDERALL XR) 10 MG 24 hr capsule Take 1 capsule (10 mg total) by mouth every morning. 30 capsule 0  . ARIPiprazole (ABILIFY) 2 MG tablet Take 1 tablet (2 mg total) by mouth daily. 30 tablet 2  . butalbital-acetaminophen-caffeine (FIORICET WITH CODEINE) 50-325-40-30 MG per capsule Take by mouth.    . clonazePAM (KLONOPIN) 0.5 MG tablet Take by mouth.    . desvenlafaxine (PRISTIQ) 100 MG 24 hr tablet Take 1 tablet (100 mg total) by mouth daily. 30 tablet 2  . diphenhydrAMINE (BENADRYL) 25 mg capsule Take by mouth.    . hydrochlorothiazide (HYDRODIURIL) 12.5 MG tablet Take 12.5 mg by mouth daily.    . hydroxychloroquine (PLAQUENIL) 200 MG tablet Take by mouth.    . levothyroxine (SYNTHROID, LEVOTHROID) 25 MCG tablet Take 25 mcg by mouth daily.    . metoprolol (LOPRESSOR) 50 MG tablet Take 50 mg by mouth 2 (two) times daily.    . prazosin (MINIPRESS) 2 MG capsule Take 1 capsule (2 mg total) by mouth at bedtime. 30 capsule 2  . rOPINIRole (REQUIP) 0.5 MG tablet Take by mouth.    . spironolactone (ALDACTONE) 25 MG tablet Take 25 mg by mouth daily.    Marland Kitchen spironolactone-hydrochlorothiazide (ALDACTAZIDE) 25-25 MG per tablet     . SUMAtriptan (  IMITREX) 20 MG/ACT nasal spray     . tiZANidine (ZANAFLEX) 4 MG capsule Take 4 mg by mouth 3 (three) times daily.    Marland Kitchen topiramate (TOPAMAX) 100 MG tablet Take by mouth.    . Topiramate ER (TROKENDI XR) 50 MG CP24 Take by mouth.    . Vitamin D, Ergocalciferol, (DRISDOL) 50000 UNITS CAPS Take 50,000 Units by mouth.    . hydroxychloroquine (PLAQUENIL) 200 MG tablet     . SUMAtriptan (IMITREX) 20 MG/ACT nasal spray Place into the nose.    . Topiramate ER 50 MG CP24 Take by mouth.     No current facility-administered medications for this  visit.    PHYSICAL EXAM: Filed Vitals:   04/04/15 1138  BP: 115/75  Pulse: 69  Temp: 97 F (36.1 C)  Resp: 18     Body mass index is 44.06 kg/(m^2).     GENERAL: Patient is alert and oriented and in no acute distress. There is no icterus or pallor. HEENT: EOMs intact. Oral exam negative for thrush or lesions. No cervical lymphadenopathy. CVS: S1S2, regular LUNGS: Bilaterally clear to auscultation, no rhonchi. ABDOMEN: Soft, nontender. No hepatosplenomegaly clinically.  EXTREMITIES: No pedal edema.   LAB RESULTS: Serum iron 74, ferritin 41, iron saturation 21%, TIBC 352    Component Value Date/Time   NA 138 03/11/2013 1337   NA 138 11/19/2012 0419   K 3.8 03/11/2013 1337   K 3.4* 11/19/2012 0419   CL 100 03/11/2013 1337   CL 104 11/19/2012 0419   CO2 27 03/11/2013 1337   CO2 28 11/19/2012 0419   GLUCOSE 114* 03/11/2013 1337   GLUCOSE 132* 11/19/2012 0419   BUN 14.3 03/11/2013 1337   BUN 9 11/19/2012 0419   CREATININE 0.8 03/11/2013 1337   CREATININE 0.62 11/19/2012 0419   CALCIUM 9.4 03/11/2013 1337   CALCIUM 8.6 11/19/2012 0419   PROT 7.7 03/11/2013 1337   PROT 7.7 11/19/2012 0419   ALBUMIN 3.3* 03/11/2013 1337   ALBUMIN 3.3* 11/19/2012 0419   AST 12 03/11/2013 1337   AST 22 11/19/2012 0419   ALT 12 03/11/2013 1337   ALT 30 11/19/2012 0419   ALKPHOS 117 03/11/2013 1337   ALKPHOS 133 11/19/2012 0419   BILITOT <0.20 Repeated and Verified 03/11/2013 1337   GFRNONAA >60 11/19/2012 0419   GFRAA >60 11/19/2012 0419   Lab Results  Component Value Date   WBC 9.1 04/04/2015   NEUTROABS 4.6 01/10/2015   HGB 14.2 04/04/2015   HCT 42.1 04/04/2015   MCV 88.6 04/04/2015   PLT 334 04/04/2015   April 2016 - hemoglobin 13.5, ferritin 69, iron 91, iron saturation 26%   ASSESSMENT / PLAN:   1. Iron deficiency Anemia intolerant of oral iron. Received Feraheme 1020 mg in Oct 2014, then repeat course in January 2016  -  have reviewed labs from today and recent, and  discussed with patient. Hemoglobin continues to do well at 14.2, iron study from today continues to remain in the normal range. Clinically she is doing well and eating study. Plan at this time is to continue surveillance, she will get hemoglobin and iron studies at 24 weeks, and we will see her back at 48 weeks with repeat labs and make further plan of management. Have explained that in between visits, if she develops recurrent iron deficiency anemia then we will need to resume parenteral iron therapy.  2. In between visits, patient advised to call or come to ER in case of any new symptoms  or acute sickness. She is agreeable to this plan.      Leia Alf, MD   04/12/2015 8:56 AM

## 2015-04-13 ENCOUNTER — Other Ambulatory Visit: Payer: Self-pay | Admitting: Family Medicine

## 2015-04-13 NOTE — Telephone Encounter (Signed)
Patient requesting refill. 

## 2015-04-20 ENCOUNTER — Encounter: Payer: Self-pay | Admitting: Family Medicine

## 2015-04-21 ENCOUNTER — Telehealth: Payer: Self-pay

## 2015-04-21 MED ORDER — AMPHETAMINE-DEXTROAMPHET ER 10 MG PO CP24: 10.0000 mg | ORAL_CAPSULE | ORAL | Status: DC

## 2015-04-21 NOTE — Telephone Encounter (Signed)
Adderall Xr 10mg  refilled and placed it with front desk staff. They will call pt to pick it up.

## 2015-05-12 ENCOUNTER — Ambulatory Visit: Payer: Self-pay | Admitting: Psychiatry

## 2015-05-14 ENCOUNTER — Encounter: Payer: Self-pay | Admitting: Family Medicine

## 2015-05-14 DIAGNOSIS — E785 Hyperlipidemia, unspecified: Secondary | ICD-10-CM | POA: Insufficient documentation

## 2015-05-14 DIAGNOSIS — G43109 Migraine with aura, not intractable, without status migrainosus: Secondary | ICD-10-CM | POA: Insufficient documentation

## 2015-05-14 DIAGNOSIS — R7989 Other specified abnormal findings of blood chemistry: Secondary | ICD-10-CM | POA: Insufficient documentation

## 2015-05-14 DIAGNOSIS — F339 Major depressive disorder, recurrent, unspecified: Secondary | ICD-10-CM | POA: Insufficient documentation

## 2015-05-14 DIAGNOSIS — E039 Hypothyroidism, unspecified: Secondary | ICD-10-CM | POA: Insufficient documentation

## 2015-05-14 DIAGNOSIS — E559 Vitamin D deficiency, unspecified: Secondary | ICD-10-CM | POA: Insufficient documentation

## 2015-05-14 DIAGNOSIS — R971 Elevated cancer antigen 125 [CA 125]: Secondary | ICD-10-CM | POA: Insufficient documentation

## 2015-05-14 DIAGNOSIS — J309 Allergic rhinitis, unspecified: Secondary | ICD-10-CM | POA: Insufficient documentation

## 2015-05-14 DIAGNOSIS — E8881 Metabolic syndrome: Secondary | ICD-10-CM | POA: Insufficient documentation

## 2015-05-14 DIAGNOSIS — M1990 Unspecified osteoarthritis, unspecified site: Secondary | ICD-10-CM | POA: Insufficient documentation

## 2015-05-14 DIAGNOSIS — G43119 Migraine with aura, intractable, without status migrainosus: Secondary | ICD-10-CM | POA: Insufficient documentation

## 2015-05-14 DIAGNOSIS — R700 Elevated erythrocyte sedimentation rate: Secondary | ICD-10-CM | POA: Insufficient documentation

## 2015-05-14 DIAGNOSIS — D500 Iron deficiency anemia secondary to blood loss (chronic): Secondary | ICD-10-CM | POA: Insufficient documentation

## 2015-05-14 DIAGNOSIS — I10 Essential (primary) hypertension: Secondary | ICD-10-CM | POA: Insufficient documentation

## 2015-05-14 DIAGNOSIS — G252 Other specified forms of tremor: Secondary | ICD-10-CM | POA: Insufficient documentation

## 2015-05-17 ENCOUNTER — Ambulatory Visit (INDEPENDENT_AMBULATORY_CARE_PROVIDER_SITE_OTHER): Payer: Self-pay | Admitting: Family Medicine

## 2015-05-17 ENCOUNTER — Encounter: Payer: Self-pay | Admitting: Family Medicine

## 2015-05-17 VITALS — BP 120/72 | HR 96 | Temp 98.0°F | Resp 18 | Ht 62.0 in | Wt 244.2 lb

## 2015-05-17 DIAGNOSIS — F4310 Post-traumatic stress disorder, unspecified: Secondary | ICD-10-CM | POA: Insufficient documentation

## 2015-05-17 DIAGNOSIS — J069 Acute upper respiratory infection, unspecified: Secondary | ICD-10-CM

## 2015-05-17 DIAGNOSIS — Z23 Encounter for immunization: Secondary | ICD-10-CM

## 2015-05-17 DIAGNOSIS — T7422XA Child sexual abuse, confirmed, initial encounter: Secondary | ICD-10-CM | POA: Insufficient documentation

## 2015-05-17 DIAGNOSIS — F331 Major depressive disorder, recurrent, moderate: Secondary | ICD-10-CM

## 2015-05-17 DIAGNOSIS — E038 Other specified hypothyroidism: Secondary | ICD-10-CM

## 2015-05-17 MED ORDER — HYDROCOD POLST-CPM POLST ER 10-8 MG/5ML PO SUER: 5.0000 mL | Freq: Two times a day (BID) | ORAL | Status: DC | PRN

## 2015-05-17 NOTE — Addendum Note (Signed)
Addended by: Steele Sizer F on: 05/17/2015 11:54 AM   Modules accepted: Orders, SmartSet
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
## 2015-05-18 ENCOUNTER — Other Ambulatory Visit: Payer: Self-pay | Admitting: Family Medicine

## 2015-05-18 LAB — TSH: TSH: 1.67 u[IU]/mL (ref 0.450–4.500)

## 2015-05-18 MED ORDER — LEVOTHYROXINE SODIUM 25 MCG PO TABS: 25.0000 ug | ORAL_TABLET | Freq: Every day | ORAL | Status: DC

## 2015-05-18 NOTE — Progress Notes (Signed)
Patient notified

## 2015-05-19 ENCOUNTER — Emergency Department
Admission: EM | Admit: 2015-05-19 | Discharge: 2015-05-19 | Disposition: A | Discharge status: Home / Self Care | Payer: BLUE CROSS/BLUE SHIELD | Attending: Emergency Medicine | Admitting: Emergency Medicine

## 2015-05-19 ENCOUNTER — Encounter: Payer: Self-pay | Admitting: Emergency Medicine

## 2015-05-19 ENCOUNTER — Emergency Department: Payer: BLUE CROSS/BLUE SHIELD

## 2015-05-19 DIAGNOSIS — Z79899 Other long term (current) drug therapy: Secondary | ICD-10-CM | POA: Diagnosis not present

## 2015-05-19 DIAGNOSIS — S99921A Unspecified injury of right foot, initial encounter: Secondary | ICD-10-CM | POA: Insufficient documentation

## 2015-05-19 DIAGNOSIS — S3992XA Unspecified injury of lower back, initial encounter: Secondary | ICD-10-CM | POA: Insufficient documentation

## 2015-05-19 DIAGNOSIS — Y9389 Activity, other specified: Secondary | ICD-10-CM | POA: Diagnosis not present

## 2015-05-19 DIAGNOSIS — Y92410 Unspecified street and highway as the place of occurrence of the external cause: Secondary | ICD-10-CM | POA: Diagnosis not present

## 2015-05-19 DIAGNOSIS — I10 Essential (primary) hypertension: Secondary | ICD-10-CM | POA: Insufficient documentation

## 2015-05-19 DIAGNOSIS — M7918 Myalgia, other site: Secondary | ICD-10-CM

## 2015-05-19 DIAGNOSIS — Y998 Other external cause status: Secondary | ICD-10-CM | POA: Insufficient documentation

## 2015-05-19 DIAGNOSIS — Z791 Long term (current) use of non-steroidal anti-inflammatories (NSAID): Secondary | ICD-10-CM | POA: Diagnosis not present

## 2015-05-19 DIAGNOSIS — M545 Low back pain, unspecified: Secondary | ICD-10-CM

## 2015-05-19 MED ORDER — CYCLOBENZAPRINE HCL 10 MG PO TABS: 10.0000 mg | ORAL_TABLET | Freq: Three times a day (TID) | ORAL | Status: DC | PRN

## 2015-05-19 MED ORDER — CYCLOBENZAPRINE HCL 10 MG PO TABS
10.0000 mg | ORAL_TABLET | Freq: Once | ORAL | Status: AC
  Administered 2015-05-19: 10 mg via ORAL
  Filled 2015-05-19: qty 1

## 2015-05-19 MED ORDER — NAPROXEN 500 MG PO TABS: 500.0000 mg | ORAL_TABLET | Freq: Two times a day (BID) | ORAL | Status: DC

## 2015-05-19 MED ORDER — NAPROXEN 500 MG PO TABS
500.0000 mg | ORAL_TABLET | Freq: Once | ORAL | Status: AC
  Administered 2015-05-19: 500 mg via ORAL
  Filled 2015-05-19: qty 1
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
## 2015-05-24 ENCOUNTER — Encounter: Payer: Self-pay | Admitting: Psychiatry

## 2015-05-24 ENCOUNTER — Ambulatory Visit (INDEPENDENT_AMBULATORY_CARE_PROVIDER_SITE_OTHER): Payer: BLUE CROSS/BLUE SHIELD | Admitting: Psychiatry

## 2015-05-24 VITALS — BP 128/88 | HR 78 | Temp 97.4°F | Ht 62.0 in | Wt 245.0 lb

## 2015-05-24 DIAGNOSIS — F4312 Post-traumatic stress disorder, chronic: Secondary | ICD-10-CM

## 2015-05-24 DIAGNOSIS — F331 Major depressive disorder, recurrent, moderate: Secondary | ICD-10-CM | POA: Diagnosis not present

## 2015-05-24 MED ORDER — ARIPIPRAZOLE 5 MG PO TABS: 5.0000 mg | ORAL_TABLET | Freq: Every day | ORAL | Status: DC

## 2015-05-24 MED ORDER — CLONAZEPAM 0.5 MG PO TABS: 0.5000 mg | ORAL_TABLET | Freq: Every day | ORAL | Status: DC

## 2015-05-24 MED ORDER — DESVENLAFAXINE SUCCINATE ER 100 MG PO TB24: 100.0000 mg | ORAL_TABLET | Freq: Every day | ORAL | Status: DC
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
## 2015-05-27 NOTE — Progress Notes (Signed)
[facility] and [facility] did [facility] the [facility] for pristiq.

## 2015-06-08 ENCOUNTER — Ambulatory Visit: Payer: BLUE CROSS/BLUE SHIELD | Admitting: Licensed Clinical Social Worker

## 2015-06-15 ENCOUNTER — Ambulatory Visit (INDEPENDENT_AMBULATORY_CARE_PROVIDER_SITE_OTHER): Payer: BLUE CROSS/BLUE SHIELD | Admitting: Licensed Clinical Social Worker

## 2015-06-15 DIAGNOSIS — F331 Major depressive disorder, recurrent, moderate: Secondary | ICD-10-CM | POA: Diagnosis not present

## 2015-06-15 DIAGNOSIS — F4312 Post-traumatic stress disorder, chronic: Secondary | ICD-10-CM | POA: Diagnosis not present
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
## 2015-06-29 ENCOUNTER — Ambulatory Visit (INDEPENDENT_AMBULATORY_CARE_PROVIDER_SITE_OTHER): Payer: BLUE CROSS/BLUE SHIELD | Admitting: Licensed Clinical Social Worker

## 2015-06-29 DIAGNOSIS — F4312 Post-traumatic stress disorder, chronic: Secondary | ICD-10-CM

## 2015-06-29 DIAGNOSIS — F331 Major depressive disorder, recurrent, moderate: Secondary | ICD-10-CM | POA: Diagnosis not present
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
## 2015-07-20 ENCOUNTER — Ambulatory Visit: Payer: Self-pay | Admitting: Licensed Clinical Social Worker

## 2015-07-21 ENCOUNTER — Ambulatory Visit: Payer: BLUE CROSS/BLUE SHIELD | Admitting: Psychiatry

## 2015-08-03 ENCOUNTER — Ambulatory Visit: Payer: Self-pay | Admitting: Licensed Clinical Social Worker

## 2015-08-15 ENCOUNTER — Ambulatory Visit (INDEPENDENT_AMBULATORY_CARE_PROVIDER_SITE_OTHER): Payer: BLUE CROSS/BLUE SHIELD | Admitting: Licensed Clinical Social Worker

## 2015-08-15 DIAGNOSIS — F331 Major depressive disorder, recurrent, moderate: Secondary | ICD-10-CM | POA: Diagnosis not present

## 2015-08-23 NOTE — Progress Notes (Signed)
   THERAPIST PROGRESS NOTE  Session Time: 47min  Participation Level: Active  Behavioral Response: CasualAlertDepressed  Type of Therapy: Individual Therapy  Treatment Goals addressed: Coping  Interventions: CBT, Motivational Interviewing, Solution Focused, Strength-based, Supportive, Family Systems and Reframing  Summary: Heather Jefferson is a 44 y.o. female who presents with symptoms of her diagnosis.  Discussion of the transition of Therapist.  Discussed her progress and regression since beginning therapy.  Reviewed goals and progress. Patient continues to be depressed about giving up her first born for adoption when she was a teenager.  Discussed coping skills.  Role played coping skills.  Suicidal/Homicidal: Nowithout intent/plan  Therapist Response: LCSW provided Patient with ongoing emotional support and encouragement.  Normalized her feelings.  Commended Patient on her progress and reinforced the importance of client staying focused on her own strengths and resources and resiliency. Processed various strategies for dealing with stressors.    Plan: Return again in 2 weeks.  Diagnosis: Axis I: Depression    Axis II: No diagnosis    Lubertha South 08/23/2015

## 2015-08-24 NOTE — Progress Notes (Signed)
On 05-24-15 pt was told to discontinue the adderal and prazosin.  Pt still takes the abilify and pristiq

## 2015-08-26 ENCOUNTER — Other Ambulatory Visit: Payer: Self-pay

## 2015-08-26 ENCOUNTER — Ambulatory Visit (INDEPENDENT_AMBULATORY_CARE_PROVIDER_SITE_OTHER): Payer: BLUE CROSS/BLUE SHIELD | Admitting: Licensed Clinical Social Worker

## 2015-08-26 DIAGNOSIS — F331 Major depressive disorder, recurrent, moderate: Secondary | ICD-10-CM | POA: Diagnosis not present

## 2015-08-26 MED ORDER — DESVENLAFAXINE SUCCINATE ER 100 MG PO TB24: 100.0000 mg | ORAL_TABLET | Freq: Every day | ORAL | Status: DC

## 2015-08-26 NOTE — Telephone Encounter (Signed)
pt is seeing nicole, pt states that she has not takin her pristiq because [facility] only [facility] her [facility] a 90 day supply and it is over a $100 to get it and she does not have the money to get it.  Pt would like to see if you would give her a 30 day supply and [facility] and [facility] if [facility] would [facility].

## 2015-08-26 NOTE — Telephone Encounter (Signed)
As covering doctor will order 30 day supply of the patient's medication as she indicates financially she cannot afford the 90 day supply. We will send Pristiq 100 mg, #30 with no refills. AW
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
## 2015-09-05 NOTE — Telephone Encounter (Signed)
tried to call patient back at the  [phone] twice and the phone number is not in services I also tried to call the [phone] and it is no long a working number either.

## 2015-09-06 ENCOUNTER — Inpatient Hospital Stay: Payer: BLUE CROSS/BLUE SHIELD | Attending: Internal Medicine

## 2015-09-06 DIAGNOSIS — D509 Iron deficiency anemia, unspecified: Secondary | ICD-10-CM

## 2015-09-06 DIAGNOSIS — Z79899 Other long term (current) drug therapy: Secondary | ICD-10-CM | POA: Insufficient documentation

## 2015-09-06 LAB — CBC WITH DIFFERENTIAL/PLATELET
Basophils Absolute: 0.1 10*3/uL (ref 0–0.1)
Basophils Relative: 2 %
EOS PCT: 2 %
Eosinophils Absolute: 0.1 10*3/uL (ref 0–0.7)
HEMATOCRIT: 33.6 % — AB (ref 35.0–47.0)
HEMOGLOBIN: 11.1 g/dL — AB (ref 12.0–16.0)
LYMPHS ABS: 2.3 10*3/uL (ref 1.0–3.6)
LYMPHS PCT: 35 %
MCH: 27.1 pg (ref 26.0–34.0)
MCHC: 32.9 g/dL (ref 32.0–36.0)
MCV: 82.5 fL (ref 80.0–100.0)
Monocytes Absolute: 0.5 10*3/uL (ref 0.2–0.9)
Monocytes Relative: 7 %
NEUTROS ABS: 3.7 10*3/uL (ref 1.4–6.5)
Neutrophils Relative %: 54 %
PLATELETS: 413 10*3/uL (ref 150–440)
RBC: 4.07 MIL/uL (ref 3.80–5.20)
RDW: 14.3 % (ref 11.5–14.5)
WBC: 6.7 10*3/uL (ref 3.6–11.0)

## 2015-09-06 LAB — IRON AND TIBC
Iron: 37 ug/dL (ref 28–170)
SATURATION RATIOS: 11 % (ref 10.4–31.8)
TIBC: 353 ug/dL (ref 250–450)
UIBC: 316 ug/dL

## 2015-09-06 LAB — FERRITIN: Ferritin: 7 ng/mL — ABNORMAL LOW (ref 11–307)

## 2015-09-19 ENCOUNTER — Other Ambulatory Visit: Payer: BLUE CROSS/BLUE SHIELD

## 2015-09-21 NOTE — Telephone Encounter (Signed)
Refilled - reorder

## 2015-10-03 ENCOUNTER — Other Ambulatory Visit: Payer: Self-pay | Admitting: Family Medicine

## 2015-10-14 ENCOUNTER — Ambulatory Visit (INDEPENDENT_AMBULATORY_CARE_PROVIDER_SITE_OTHER): Payer: BLUE CROSS/BLUE SHIELD | Admitting: Licensed Clinical Social Worker

## 2015-10-14 DIAGNOSIS — F331 Major depressive disorder, recurrent, moderate: Secondary | ICD-10-CM | POA: Diagnosis not present

## 2015-10-24 NOTE — Progress Notes (Signed)
   THERAPIST PROGRESS NOTE  Session Time: 78min  Participation Level: Active  Behavioral Response: CasualAlertDepressed  Type of Therapy: Individual Therapy  Treatment Goals addressed: Coping  Interventions: CBT, Motivational Interviewing, Solution Focused, Family Systems and Reframing  Summary: Heather Jefferson is a 45 y.o. female who presents with continued symptoms of her diagnosis.  She continues to have difficulty with her decisions as a young adult.  She contiues to feel guilty about giving her eldest child up for adoption.  She reports that she wants to build relationship with her mother and sister but has difficulty with communication when she is around them.  She explored coping skills to assist with her current symptoms.   Suicidal/Homicidal: Nowithout intent/plan  Therapist Response: LCSW provided Patient with ongoing emotional support and encouragement.  Normalized her feelings.  Commended Patient on her progress and reinforced the importance of client staying focused on her own strengths and resources and resiliency. Processed various strategies for dealing with stressors.   Plan: Return again in 2 weeks.  Diagnosis: Axis I: Depression    Axis II: No diagnosis    Lubertha South 10/14/2015

## 2015-11-14 ENCOUNTER — Ambulatory Visit (INDEPENDENT_AMBULATORY_CARE_PROVIDER_SITE_OTHER): Payer: BLUE CROSS/BLUE SHIELD | Admitting: Licensed Clinical Social Worker

## 2015-11-14 DIAGNOSIS — F331 Major depressive disorder, recurrent, moderate: Secondary | ICD-10-CM

## 2015-11-22 NOTE — Progress Notes (Signed)
   THERAPIST PROGRESS NOTE  Session Time: 92min  Participation Level: Active  Behavioral Response: CasualAlertDepressed  Type of Therapy: Individual Therapy  Treatment Goals addressed: Coping and Diagnosis: Depression  Interventions: CBT, Motivational Interviewing, Solution Focused, Supportive and Reframing  Summary: Heather Jefferson is a 44 y.o. female who presents with continued symptoms of her diagnosis.  Patient listed her current stressors and was able to discuss how she could have handled the issue differently.  Patient reports a lack of communication and intimacy with her husband.  While discussing her relationship she became tearful and states that he has never been "touchy, feely" with her.  She states that she wants a "emotional connection" with her.  Discussion on how she can communicate that with him.  Role Played communication skills with her.  Suicidal/Homicidal: Nowithout intent/plan  Therapist Response: LCSW provided Patient with ongoing emotional support and encouragement.  Normalized her feelings.  Commended Patient on her progress and reinforced the importance of client staying focused on her own strengths and resources and resiliency. Processed various strategies for dealing with stressors.    Plan: Discussion of Therapist going on [place] soon.  Provided her with options for therapy.  Diagnosis: Axis I: Depression    Axis II: No diagnosis    Lubertha South 11/14/2015

## 2015-12-14 ENCOUNTER — Ambulatory Visit (INDEPENDENT_AMBULATORY_CARE_PROVIDER_SITE_OTHER): Payer: BLUE CROSS/BLUE SHIELD | Admitting: Psychiatry

## 2015-12-14 ENCOUNTER — Encounter: Payer: Self-pay | Admitting: Psychiatry

## 2015-12-14 VITALS — BP 132/84 | HR 96 | Temp 98.7°F | Ht 62.0 in | Wt 251.4 lb

## 2015-12-14 DIAGNOSIS — F330 Major depressive disorder, recurrent, mild: Secondary | ICD-10-CM

## 2015-12-14 DIAGNOSIS — F4312 Post-traumatic stress disorder, chronic: Secondary | ICD-10-CM | POA: Diagnosis not present

## 2015-12-14 MED ORDER — ARIPIPRAZOLE 5 MG PO TABS: 2.5000 mg | ORAL_TABLET | Freq: Every day | ORAL | Status: DC

## 2015-12-14 MED ORDER — DESVENLAFAXINE SUCCINATE ER 50 MG PO TB24: 50.0000 mg | ORAL_TABLET | Freq: Every day | ORAL | Status: DC

## 2015-12-14 MED ORDER — BUPROPION HCL ER (XL) 150 MG PO TB24: 150.0000 mg | ORAL_TABLET | Freq: Every day | ORAL | Status: DC

## 2015-12-14 NOTE — Progress Notes (Signed)
BH MD/PA/NP OP Progress Note  12/14/2015 8:54 AM Heather Jefferson  MRN:  [id number]  Subjective:    Patient is a 45 year old female who presented for the follow-up appointment. She was last seen in August. Patient reported that she continued her therapy appointments with Elmyra Ricks and was being all in her on a monthly basis. Patient reported that she has also been taking the medications including Pristiq and Abilify on a daily basis. However she ran out of the prescription and had severe withdrawal symptoms so the Pristiq as she was unable to afford the medication. She was able to refill the medication about a month ago. She reported that she experiences severe withdrawal symptoms related to the Pristiq. Patient stated that she wants to have her medications adjusted. She responded well to Wellbutrin in the past. Patient reported that she wants to try the Wellbutrin again. She currently denied having any suicidal ideations or plans. She appeared well-groomed and was interactive during the interview. She reported that she hadn't participated well in the therapy sessions and they were very helpful to her. She stated that she sleeps well at night. She denied having any perceptual disturbances. She denied having any suicidal ideations or plans. No adverse effects to the medications noted at this time.  Chief Complaint:  Chief Complaint    Follow-up; Medication Refill; Panic Attack; Anxiety     Visit Diagnosis:     ICD-9-CM ICD-10-CM   1. Chronic post-traumatic stress disorder (PTSD) 309.81 F43.12   2. MDD (major depressive disorder), recurrent episode, mild (HCC) 296.31 F33.0     Past Medical History:  Past Medical History  Diagnosis Date  . Anxiety   . Depression   . Hypertension   . Thyroid disease   . PTSD (post-traumatic stress disorder)   . Headache   . Generalized anxiety disorder   . Major depressive disorder, recurrent episode, moderate ([place])   . Seizure disorder ([place])   .  Persistent disorder of initiating or maintaining sleep   . ADHD (attention deficit hyperactivity disorder)   . Anemia   . HIV infection ([place])   . Allergy   . Vitamin D deficiency   . Fibromyalgia   . Migraine with acute onset aura   . Restless leg     Past Surgical History  Procedure Laterality Date  . Tubal ligation     Family History:  Family History  Problem Relation Age of Onset  . Hypertension Mother   . Heart attack Mother   . CAD Mother   . Diabetes Father   . Hypertension Father   . Hypertension Sister   . Anxiety disorder Brother   . Depression Brother    Social History:  Social History   Social History  . Marital Status: Married    Spouse Name: N/A  . Number of Children: N/A  . Years of Education: N/A   Social History Main Topics  . Smoking status: Never Smoker   . Smokeless tobacco: Never Used  . Alcohol Use: 0.0 oz/week    0 Standard drinks or equivalent, 0 Glasses of wine, 0 Cans of beer, 0 Shots of liquor per week     Comment: social drinker  . Drug Use: No  . Sexual Activity:    Partners: Male    Birth Control/ Protection: Other-see comments     Comment: Tubal Ligation   Other Topics Concern  . None   Social History Narrative   Additional History:  She currently lives with her husband  and 2 children ages 52 and 61 years old. She has good relationship with them.  Assessment:   Musculoskeletal: Strength & Muscle Tone: within normal limits Gait & Station: normal Patient leans: N/A  Psychiatric Specialty Exam: Anxiety      ROS   Blood pressure 132/84, pulse 96, temperature 98.7 F (37.1 C), temperature source Tympanic, height 5\' 2"  (1.575 m), weight 251 lb 6.4 oz (114.034 kg), last menstrual period 11/26/2015, SpO2 99 %.Body mass index is 45.97 kg/(m^2).  General Appearance: Casual  Eye Contact:  Fair  Speech:  Clear and Coherent  Volume:  Normal  Mood:  Euthymic  Affect:  Congruent  Thought Process:  Logical  Orientation:  Full  (Time, Place, and Person)  Thought Content:  WDL  Suicidal Thoughts:  No  Homicidal Thoughts:  No  Memory:  Immediate;   Fair  Judgement:  Fair  Insight:  Fair  Psychomotor Activity:  Decreased  Concentration:  Fair  Recall:  [employer] of Knowledge: Fair  Language: Fair  Akathisia:  No  Handed:  Right  AIMS (if indicated):  none  Assets:  Communication Skills Desire for Improvement Physical Health  ADL's:  Intact  Cognition: WNL  Sleep:  6-8   Is the patient at risk to self?  No. Has the patient been a risk to self in the past 6 months?  No. Has the patient been a risk to self within the distant past?  No. Is the patient a risk to others?  No. Has the patient been a risk to others in the past 6 months?  No. Has the patient been a risk to others within the distant past?  No.  Current Medications: Current Outpatient Prescriptions  Medication Sig Dispense Refill  . ARIPiprazole (ABILIFY) 5 MG tablet Take 1 tablet (5 mg total) by mouth daily. 90 tablet 1  . clonazePAM (KLONOPIN) 0.5 MG tablet Take 1 tablet (0.5 mg total) by mouth at bedtime. 30 tablet 2  . desvenlafaxine (PRISTIQ) 100 MG 24 hr tablet Take 1 tablet (100 mg total) by mouth daily. 30 tablet 0  . hydroxychloroquine (PLAQUENIL) 200 MG tablet Take by mouth.    . levothyroxine (SYNTHROID, LEVOTHROID) 25 MCG tablet TAKE ONE TABLET BY MOUTH ONCE DAILY 30 tablet 5  . [START ON 12/20/2015] ofloxacin (OCUFLOX) 0.3 % ophthalmic solution Apply to eye.    Derrill Memo ON 12/20/2015] prednisoLONE acetate (PRED FORTE) 1 % ophthalmic suspension Apply to eye.    . spironolactone-hydrochlorothiazide (ALDACTAZIDE) 25-25 MG per tablet     . SUMAtriptan (IMITREX) 20 MG/ACT nasal spray Place into the nose.    . Topiramate ER 50 MG CP24 Take by mouth.    . Vitamin D, Ergocalciferol, (DRISDOL) 50000 UNITS CAPS Take 50,000 Units by mouth.     No current facility-administered medications for this visit.    Medical Decision Making:   Established Problem, Stable/Improving (1), Review of Psycho-Social Stressors (1) and Review of Medication Regimen & Side Effects (2)  Treatment Plan Summary:Medication management   She has previously tried Celexa, Paxil, Wellbutrin and has responded well to Wellbutrin in the past  Discussed with patient about her medications treatment risks benefits and alternatives.  I will decrease the dose of Abilify to 2.5 mg daily She will continue on Pristiq daily 50 mg in the morning I will start her on Wellbutrin XL 150 mg every morning and she agreed with the plan. Patient will be gradually tapered out of the Pristiq and she agreed with  the change of her medications at this time. She will follow-up in 4 weeks.     More than 50% of the time spent in psychoeducation, counseling and coordination of care.    This note was generated in part or whole with voice recognition software. Voice regonition is usually quite accurate but there are transcription errors that can and very often do occur. I apologize for any typographical errors that were not detected and corrected.   Rainey Pines, MD  12/14/2015, 8:54 AM

## 2015-12-15 ENCOUNTER — Encounter: Payer: Self-pay | Admitting: Family Medicine

## 2015-12-20 HISTORY — PX: OTHER SURGICAL HISTORY: SHX169

## 2015-12-22 ENCOUNTER — Other Ambulatory Visit: Payer: Self-pay | Admitting: Family Medicine

## 2016-01-12 ENCOUNTER — Encounter: Payer: Self-pay | Admitting: Psychiatry

## 2016-01-12 ENCOUNTER — Ambulatory Visit (INDEPENDENT_AMBULATORY_CARE_PROVIDER_SITE_OTHER): Payer: BLUE CROSS/BLUE SHIELD | Admitting: Psychiatry

## 2016-01-12 VITALS — BP 124/86 | HR 75 | Temp 97.6°F | Ht 62.0 in | Wt 251.8 lb

## 2016-01-12 DIAGNOSIS — F4312 Post-traumatic stress disorder, chronic: Secondary | ICD-10-CM | POA: Diagnosis not present

## 2016-01-12 DIAGNOSIS — F331 Major depressive disorder, recurrent, moderate: Secondary | ICD-10-CM | POA: Diagnosis not present

## 2016-01-12 MED ORDER — ARIPIPRAZOLE 5 MG PO TABS: 5.0000 mg | ORAL_TABLET | Freq: Every day | ORAL | Status: DC

## 2016-01-12 MED ORDER — DESVENLAFAXINE SUCCINATE ER 50 MG PO TB24: 50.0000 mg | ORAL_TABLET | Freq: Every day | ORAL | Status: DC

## 2016-01-12 NOTE — Progress Notes (Signed)
BH MD/PA/NP OP Progress Note  01/12/2016 8:40 AM Heather Jefferson  MRN:  [id number]  Subjective:    Patient is a 45 year old female who presented for the follow-up appointment. She reported that she has been having headaches since the past month she. She recently had her cornea transplant surgery in one of her eye. She reported that she is recuperating well from the same. Patient reported that she is able to see well. She was started on Wellbutrin at her last appointment. She reported that her mood symptoms are improving. She was also taking her migraine medications on a regular basis. Patient reported that she is sleeping well at night. We discussed about her medications and patient has started taking the Pristiq on alternate days but it did not help. She has resumed to take 50 mg on a daily basis now. She is also decrease the dose of Abilify to 2.5 mg daily. She reported that occasionally she feels tired. She currently denied worsening of her steps. She appeared alert and oriented during the interview. She denied having any suicidal homicidal ideations or plans.  Chief Complaint:  Chief Complaint    Follow-up; Medication Refill     Visit Diagnosis:     ICD-9-CM ICD-10-CM   1. Chronic post-traumatic stress disorder (PTSD) 309.81 F43.12   2. MDD (major depressive disorder), recurrent episode, moderate ([place]) 296.32 F33.1     Past Medical History:  Past Medical History  Diagnosis Date  . Anxiety   . Depression   . Hypertension   . Thyroid disease   . PTSD (post-traumatic stress disorder)   . Headache   . Generalized anxiety disorder   . Major depressive disorder, recurrent episode, moderate ([place])   . Seizure disorder ([place])   . Persistent disorder of initiating or maintaining sleep   . ADHD (attention deficit hyperactivity disorder)   . Anemia   . HIV infection ([place])   . Allergy   . Vitamin D deficiency   . Fibromyalgia   . Migraine with acute onset aura   . Restless leg      Past Surgical History  Procedure Laterality Date  . Tubal ligation    . Eye surgery     Family History:  Family History  Problem Relation Age of Onset  . Hypertension Mother   . Heart attack Mother   . CAD Mother   . Diabetes Father   . Hypertension Father   . Hypertension Sister   . Anxiety disorder Brother   . Depression Brother    Social History:  Social History   Social History  . Marital Status: Married    Spouse Name: N/A  . Number of Children: N/A  . Years of Education: N/A   Social History Main Topics  . Smoking status: Never Smoker   . Smokeless tobacco: Never Used  . Alcohol Use: 0.0 oz/week    0 Glasses of wine, 0 Cans of beer, 0 Shots of liquor, 0 Standard drinks or equivalent per week     Comment: social drinker  . Drug Use: No  . Sexual Activity:    Partners: Male    Birth Control/ Protection: Other-see comments     Comment: Tubal Ligation   Other Topics Concern  . None   Social History Narrative   Additional History:  She currently lives with her husband and 2 children ages 16 and 14 years old. She has good relationship with them.  Assessment:   Musculoskeletal: Strength & Muscle Tone: within normal limits  Gait & Station: normal Patient leans: N/A  Psychiatric Specialty Exam: Anxiety      ROS  Blood pressure 124/86, pulse 75, temperature 97.6 F (36.4 C), temperature source Tympanic, height 5\' 2"  (1.575 m), weight 251 lb 12.8 oz (114.216 kg), last menstrual period 11/26/2015, SpO2 99 %.Body mass index is 46.04 kg/(m^2).  General Appearance: Casual  Eye Contact:  Fair  Speech:  Clear and Coherent  Volume:  Normal  Mood:  Euthymic  Affect:  Congruent  Thought Process:  Logical  Orientation:  Full (Time, Place, and Person)  Thought Content:  WDL  Suicidal Thoughts:  No  Homicidal Thoughts:  No  Memory:  Immediate;   Fair  Judgement:  Fair  Insight:  Fair  Psychomotor Activity:  Decreased  Concentration:  Fair  Recall:  [employer] of Knowledge: Fair  Language: Fair  Akathisia:  No  Handed:  Right  AIMS (if indicated):  none  Assets:  Communication Skills Desire for Improvement Physical Health  ADL's:  Intact  Cognition: WNL  Sleep:  6-8   Is the patient at risk to self?  No. Has the patient been a risk to self in the past 6 months?  No. Has the patient been a risk to self within the distant past?  No. Is the patient a risk to others?  No. Has the patient been a risk to others in the past 6 months?  No. Has the patient been a risk to others within the distant past?  No.  Current Medications: Current Outpatient Prescriptions  Medication Sig Dispense Refill  . ARIPiprazole (ABILIFY) 5 MG tablet Take 0.5 tablets (2.5 mg total) by mouth daily. Pt has supply 90 tablet 1  . buPROPion (WELLBUTRIN XL) 150 MG 24 hr tablet Take 1 tablet (150 mg total) by mouth daily. 30 tablet 1  . clonazePAM (KLONOPIN) 0.5 MG tablet Take 1 tablet (0.5 mg total) by mouth at bedtime. 30 tablet 2  . desvenlafaxine (PRISTIQ) 50 MG 24 hr tablet Take 1 tablet (50 mg total) by mouth daily. 30 tablet 0  . hydroxychloroquine (PLAQUENIL) 200 MG tablet Take by mouth.    . levothyroxine (SYNTHROID, LEVOTHROID) 25 MCG tablet TAKE ONE TABLET BY MOUTH ONCE DAILY 30 tablet 5  . prednisoLONE acetate (PRED FORTE) 1 % ophthalmic suspension Apply to eye.    Marland Kitchen PRISTIQ 100 MG 24 hr tablet     . spironolactone-hydrochlorothiazide (ALDACTAZIDE) 25-25 MG tablet TAKE 1 TABLET DAILY FOR BLOOD PRESSURE. STOP LOSARTAN. 90 tablet 0  . SUMAtriptan (IMITREX) 20 MG/ACT nasal spray Place into the nose.    . Vitamin D, Ergocalciferol, (DRISDOL) 50000 UNITS CAPS Take 50,000 Units by mouth.     No current facility-administered medications for this visit.    Medical Decision Making:  Established Problem, Stable/Improving (1), Review of Psycho-Social Stressors (1) and Review of Medication Regimen & Side Effects (2)  Treatment Plan Summary:Medication management    She has previously tried Celexa, Paxil, Wellbutrin and has responded well to Wellbutrin in the past  Discussed with patient about her medications treatment risks benefits and alternatives.  Discussed about the adverse effects of Wellbutrin leading to headaches and she agreed with the plan. I will discontinue Wellbutrin at this time. She will continue on Pristiq 50 mg in the morning and will titrate the Abilify to 5 mg daily. Prescription given for a month. She will follow-up in one month.     More than 50% of the time spent in psychoeducation, counseling  and coordination of care.    This note was generated in part or whole with voice recognition software. Voice regonition is usually quite accurate but there are transcription errors that can and very often do occur. I apologize for any typographical errors that were not detected and corrected.   Rainey Pines, MD  01/12/2016, 8:40 AM

## 2016-02-09 ENCOUNTER — Ambulatory Visit (INDEPENDENT_AMBULATORY_CARE_PROVIDER_SITE_OTHER): Payer: BLUE CROSS/BLUE SHIELD | Admitting: Psychiatry

## 2016-02-09 ENCOUNTER — Encounter: Payer: Self-pay | Admitting: Psychiatry

## 2016-02-09 VITALS — BP 122/88 | HR 89 | Temp 98.1°F | Ht 62.0 in | Wt 251.2 lb

## 2016-02-09 DIAGNOSIS — F331 Major depressive disorder, recurrent, moderate: Secondary | ICD-10-CM

## 2016-02-09 DIAGNOSIS — F4312 Post-traumatic stress disorder, chronic: Secondary | ICD-10-CM | POA: Diagnosis not present

## 2016-02-09 MED ORDER — DESVENLAFAXINE SUCCINATE ER 50 MG PO TB24: 50.0000 mg | ORAL_TABLET | Freq: Every day | ORAL | Status: DC

## 2016-02-09 MED ORDER — ARIPIPRAZOLE 5 MG PO TABS: 5.0000 mg | ORAL_TABLET | Freq: Every day | ORAL | Status: DC

## 2016-02-09 NOTE — Progress Notes (Signed)
BH MD/PA/NP OP Progress Note  02/09/2016 8:44 AM ARPIL BLYDENBURGH  MRN:  [id number]  Subjective:    Patient is a 45 year old female who presented for the follow-up appointment. She reported that she continues to feel tired during the day. She reported that she has been stressed out due to several factors as her step sister passed away recently. She is also thinking about her daughter as May 14 was her birthday. Patient reported that she has been having worsening of her anxiety symptoms during this month. She reported that this is the time of the year which she will always have worsening of her symptoms. Patient reported that she has been compliant with her medication. She takes During the daytime. She reported that the medications are helping her and she is taking Abilify 5 mg in the morning along with Pristiq. She is taking Klonopin on a when necessary basis. She currently denied having any suicidal ideations or plans  Chief Complaint:  Chief Complaint    Anxiety; Establish Care; Fatigue     Visit Diagnosis:     ICD-9-CM ICD-10-CM   1. Chronic post-traumatic stress disorder (PTSD) 309.81 F43.12   2. MDD (major depressive disorder), recurrent episode, moderate ([place]) 296.32 F33.1     Past Medical History:  Past Medical History  Diagnosis Date  . Anxiety   . Depression   . Hypertension   . Thyroid disease   . PTSD (post-traumatic stress disorder)   . Headache   . Generalized anxiety disorder   . Major depressive disorder, recurrent episode, moderate ([place])   . Seizure disorder ([place])   . Persistent disorder of initiating or maintaining sleep   . ADHD (attention deficit hyperactivity disorder)   . Anemia   . HIV infection ([place])   . Allergy   . Vitamin D deficiency   . Fibromyalgia   . Migraine with acute onset aura   . Restless leg     Past Surgical History  Procedure Laterality Date  . Tubal ligation    . Eye surgery     Family History:  Family History  Problem Relation  Age of Onset  . Hypertension Mother   . Heart attack Mother   . CAD Mother   . Diabetes Father   . Hypertension Father   . Hypertension Sister   . Anxiety disorder Brother   . Depression Brother    Social History:  Social History   Social History  . Marital Status: Married    Spouse Name: N/A  . Number of Children: N/A  . Years of Education: N/A   Social History Main Topics  . Smoking status: Never Smoker   . Smokeless tobacco: Never Used  . Alcohol Use: 0.0 oz/week    0 Glasses of wine, 0 Cans of beer, 0 Shots of liquor, 0 Standard drinks or equivalent per week     Comment: social drinker  . Drug Use: No  . Sexual Activity:    Partners: Male    Birth Control/ Protection: Other-see comments     Comment: Tubal Ligation   Other Topics Concern  . None   Social History Narrative   Additional History:  She currently lives with her husband and 2 children ages 61 and 56 years old. She has good relationship with them.  Assessment:   Musculoskeletal: Strength & Muscle Tone: within normal limits Gait & Station: normal Patient leans: N/A  Psychiatric Specialty Exam: Anxiety      ROS  Blood pressure 122/88, pulse 89,  temperature 98.1 F (36.7 C), temperature source Tympanic, height 5\' 2"  (1.575 m), weight 251 lb 3.2 oz (113.944 kg), SpO2 95 %.Body mass index is 45.93 kg/(m^2).  General Appearance: Casual  Eye Contact:  Fair  Speech:  Clear and Coherent  Volume:  Normal  Mood:  Euthymic  Affect:  Congruent  Thought Process:  Logical  Orientation:  Full (Time, Place, and Person)  Thought Content:  WDL  Suicidal Thoughts:  No  Homicidal Thoughts:  No  Memory:  Immediate;   Fair  Judgement:  Fair  Insight:  Fair  Psychomotor Activity:  Decreased  Concentration:  Fair  Recall:  [employer] of Knowledge: Fair  Language: Fair  Akathisia:  No  Handed:  Right  AIMS (if indicated):  none  Assets:  Communication Skills Desire for Improvement Physical Health   ADL's:  Intact  Cognition: WNL  Sleep:  6-8   Is the patient at risk to self?  No. Has the patient been a risk to self in the past 6 months?  No. Has the patient been a risk to self within the distant past?  No. Is the patient a risk to others?  No. Has the patient been a risk to others in the past 6 months?  No. Has the patient been a risk to others within the distant past?  No.  Current Medications: Current Outpatient Prescriptions  Medication Sig Dispense Refill  . ARIPiprazole (ABILIFY) 5 MG tablet Take 1 tablet (5 mg total) by mouth daily. 90 tablet 1  . clonazePAM (KLONOPIN) 0.5 MG tablet Take 1 tablet (0.5 mg total) by mouth at bedtime. 30 tablet 2  . desvenlafaxine (PRISTIQ) 50 MG 24 hr tablet Take 1 tablet (50 mg total) by mouth daily. 30 tablet 1  . hydroxychloroquine (PLAQUENIL) 200 MG tablet Take by mouth.    . levothyroxine (SYNTHROID, LEVOTHROID) 25 MCG tablet TAKE ONE TABLET BY MOUTH ONCE DAILY 30 tablet 5  . spironolactone-hydrochlorothiazide (ALDACTAZIDE) 25-25 MG tablet TAKE 1 TABLET DAILY FOR BLOOD PRESSURE. STOP LOSARTAN. 90 tablet 0  . SUMAtriptan (IMITREX) 20 MG/ACT nasal spray Place into the nose.    . Vitamin D, Ergocalciferol, (DRISDOL) 50000 UNITS CAPS Take 50,000 Units by mouth.     No current facility-administered medications for this visit.    Medical Decision Making:  Established Problem, Stable/Improving (1), Review of Psycho-Social Stressors (1) and Review of Medication Regimen & Side Effects (2)  Treatment Plan Summary:Medication management     Discussed with patient about her medications treatment risks benefits and alternatives.  She will continue on Pristiq 50 mg in the morning and   Abilify to 5 mg daily. She will follow-up in 2 month. She is taking Klonopin on a when necessary basis. No prescription given at this time     More than 50% of the time spent in psychoeducation, counseling and coordination of care.    This note was  generated in part or whole with voice recognition software. Voice regonition is usually quite accurate but there are transcription errors that can and very often do occur. I apologize for any typographical errors that were not detected and corrected.   Rainey Pines, MD  02/09/2016, 8:44 AM

## 2016-03-05 ENCOUNTER — Inpatient Hospital Stay: Payer: BLUE CROSS/BLUE SHIELD | Attending: Oncology

## 2016-03-05 ENCOUNTER — Encounter: Payer: Self-pay | Admitting: Oncology

## 2016-03-05 ENCOUNTER — Ambulatory Visit: Payer: BLUE CROSS/BLUE SHIELD | Admitting: Internal Medicine

## 2016-03-05 ENCOUNTER — Other Ambulatory Visit: Payer: Self-pay | Admitting: Oncology

## 2016-03-05 ENCOUNTER — Inpatient Hospital Stay (HOSPITAL_BASED_OUTPATIENT_CLINIC_OR_DEPARTMENT_OTHER): Payer: BLUE CROSS/BLUE SHIELD | Admitting: Oncology

## 2016-03-05 VITALS — BP 145/76 | HR 76 | Temp 98.1°F

## 2016-03-05 DIAGNOSIS — F4310 Post-traumatic stress disorder, unspecified: Secondary | ICD-10-CM | POA: Diagnosis not present

## 2016-03-05 DIAGNOSIS — G2581 Restless legs syndrome: Secondary | ICD-10-CM | POA: Insufficient documentation

## 2016-03-05 DIAGNOSIS — F329 Major depressive disorder, single episode, unspecified: Secondary | ICD-10-CM | POA: Insufficient documentation

## 2016-03-05 DIAGNOSIS — F909 Attention-deficit hyperactivity disorder, unspecified type: Secondary | ICD-10-CM | POA: Insufficient documentation

## 2016-03-05 DIAGNOSIS — D509 Iron deficiency anemia, unspecified: Secondary | ICD-10-CM | POA: Insufficient documentation

## 2016-03-05 DIAGNOSIS — B20 Human immunodeficiency virus [HIV] disease: Secondary | ICD-10-CM

## 2016-03-05 DIAGNOSIS — Z79899 Other long term (current) drug therapy: Secondary | ICD-10-CM | POA: Diagnosis not present

## 2016-03-05 DIAGNOSIS — I10 Essential (primary) hypertension: Secondary | ICD-10-CM | POA: Diagnosis not present

## 2016-03-05 DIAGNOSIS — M797 Fibromyalgia: Secondary | ICD-10-CM | POA: Diagnosis not present

## 2016-03-05 DIAGNOSIS — F419 Anxiety disorder, unspecified: Secondary | ICD-10-CM | POA: Insufficient documentation

## 2016-03-05 LAB — IRON AND TIBC
IRON: 22 ug/dL — AB (ref 28–170)
Saturation Ratios: 5 % — ABNORMAL LOW (ref 10.4–31.8)
TIBC: 442 ug/dL (ref 250–450)
UIBC: 420 ug/dL

## 2016-03-05 LAB — CBC WITH DIFFERENTIAL/PLATELET
BASOS PCT: 1 %
Basophils Absolute: 0.1 10*3/uL (ref 0–0.1)
EOS ABS: 0.1 10*3/uL (ref 0–0.7)
Eosinophils Relative: 1 %
HEMATOCRIT: 30.3 % — AB (ref 35.0–47.0)
HEMOGLOBIN: 9.5 g/dL — AB (ref 12.0–16.0)
Lymphocytes Relative: 29 %
Lymphs Abs: 2.7 10*3/uL (ref 1.0–3.6)
MCH: 22.6 pg — AB (ref 26.0–34.0)
MCHC: 31.5 g/dL — AB (ref 32.0–36.0)
MCV: 71.6 fL — ABNORMAL LOW (ref 80.0–100.0)
MONO ABS: 0.6 10*3/uL (ref 0.2–0.9)
MONOS PCT: 6 %
NEUTROS ABS: 5.8 10*3/uL (ref 1.4–6.5)
Neutrophils Relative %: 63 %
Platelets: 421 10*3/uL (ref 150–440)
RBC: 4.23 MIL/uL (ref 3.80–5.20)
RDW: 17.5 % — AB (ref 11.5–14.5)
WBC: 9.2 10*3/uL (ref 3.6–11.0)

## 2016-03-05 LAB — FERRITIN: Ferritin: 7 ng/mL — ABNORMAL LOW (ref 11–307)

## 2016-03-05 NOTE — Progress Notes (Signed)
[facility]  [phone]  [phone]     Heather Jefferson DOB: 03-02-1971  MR#: [id number]  [id number]  Patient Care Team: Steele Sizer, MD as PCP - General  CHIEF COMPLAINT:  Chief Complaint  Patient presents with  . IDA    INTERVAL HISTORY: Patient returns to clinic today for lab results, evaluation and possible IV feraheme. She reports being more tired today but otherwise feels well. She was last seen by Dr Ma Hillock in July 2016. Her last IV feraheme was in January 2016. She is following with rheumatologist for joint issues. She denies dyspnea, orthopnea, chest pain, or palpitation. She has not had any bleeding symptoms except for regular menstrual periods. She denies new bone pains. Her appetite is good and she denies weight loss. She denies fever, chills or sweats. She denies abdominal pain, constipation, diarrhea, dysuria or hematuria.  REVIEW OF SYSTEMS:   Review of Systems  Constitutional: Positive for malaise/fatigue.  HENT: Negative.   Eyes: Negative.   Respiratory: Negative.   Cardiovascular: Negative.   Gastrointestinal: Negative.   Genitourinary: Negative.   Musculoskeletal: Negative.   Skin: Negative.   Neurological: Positive for weakness.  Endo/Heme/Allergies: Negative.   Psychiatric/Behavioral: Negative.     As per HPI. Otherwise, a complete review of systems is negatve.  ONCOLOGY HISTORY:  No history exists.    PAST MEDICAL HISTORY: Past Medical History  Diagnosis Date  . Anxiety   . Depression   . Hypertension   . Thyroid disease   . PTSD (post-traumatic stress disorder)   . Headache   . Generalized anxiety disorder   . Major depressive disorder, recurrent episode, moderate ([place])   . Seizure disorder ([place])   . Persistent disorder of initiating or maintaining sleep   . ADHD (attention deficit hyperactivity disorder)   . Anemia   . HIV infection ([place])   . Allergy   . Vitamin D deficiency   .  Fibromyalgia   . Migraine with acute onset aura   . Restless leg     PAST SURGICAL HISTORY: Past Surgical History  Procedure Laterality Date  . Tubal ligation    . Eye surgery      FAMILY HISTORY Family History  Problem Relation Age of Onset  . Hypertension Mother   . Heart attack Mother   . CAD Mother   . Diabetes Father   . Hypertension Father   . Hypertension Sister   . Anxiety disorder Brother   . Depression Brother       ADVANCED DIRECTIVES:    HEALTH MAINTENANCE: Social History  Substance Use Topics  . Smoking status: Never Smoker   . Smokeless tobacco: Never Used  . Alcohol Use: 0.0 oz/week    0 Glasses of wine, 0 Cans of beer, 0 Shots of liquor, 0 Standard drinks or equivalent per week     Comment: social drinker    Allergies  Allergen Reactions  . Amoxicillin-Pot Clavulanate Hives  . Losartan Cough    Current Outpatient Prescriptions  Medication Sig Dispense Refill  . ARIPiprazole (ABILIFY) 5 MG tablet Take 1 tablet (5 mg total) by mouth daily. 90 tablet 1  . butalbital-acetaminophen-caffeine (FIORICET WITH CODEINE) 50-325-40-30 MG capsule Take by mouth.    . clonazePAM (KLONOPIN) 0.5 MG tablet Take 1 tablet (0.5 mg total) by mouth at bedtime. 30 tablet 2  . desvenlafaxine (PRISTIQ) 50 MG 24 hr tablet Take 1 tablet (50 mg total) by mouth daily. 30 tablet 1  . diphenhydrAMINE (BENADRYL)  25 mg capsule Take by mouth.    . hydroxychloroquine (PLAQUENIL) 200 MG tablet Take by mouth.    . levothyroxine (SYNTHROID, LEVOTHROID) 25 MCG tablet TAKE ONE TABLET BY MOUTH ONCE DAILY 30 tablet 5  . prednisoLONE acetate (PRED FORTE) 1 % ophthalmic suspension Apply to eye.    . spironolactone-hydrochlorothiazide (ALDACTAZIDE) 25-25 MG tablet TAKE 1 TABLET DAILY FOR BLOOD PRESSURE. STOP LOSARTAN. 90 tablet 0  . SUMAtriptan (IMITREX) 20 MG/ACT nasal spray Place into the nose.    . Vitamin D, Ergocalciferol, (DRISDOL) 50000 UNITS CAPS Take 50,000 Units by mouth.     No  current facility-administered medications for this visit.    OBJECTIVE: BP 145/76 mmHg  Pulse 76  Temp(Src) 98.1 F (36.7 C) (Tympanic)   There is no weight on file to calculate BMI.    ECOG FS:1 - Symptomatic but completely ambulatory  General: Well-developed, well nourished, no acute distress. Eyes: No icterus or pallor Lungs: Clear to auscultation bilaterally. Heart: Regular rate and rhythm. Abdomen: Soft, nontender. Neuro: Alert, answering all questions appropriately.  Skin: No rashes or petechiae noted. Psych: Normal affect.   LAB RESULTS:  Appointment on 03/05/2016  Component Date Value Ref Range Status  . WBC 03/05/2016 9.2  3.6 - 11.0 K/uL Final  . RBC 03/05/2016 4.23  3.80 - 5.20 MIL/uL Final  . Hemoglobin 03/05/2016 9.5* 12.0 - 16.0 g/dL Final  . HCT 03/05/2016 30.3* 35.0 - 47.0 % Final  . MCV 03/05/2016 71.6* 80.0 - 100.0 fL Final  . MCH 03/05/2016 22.6* 26.0 - 34.0 pg Final  . MCHC 03/05/2016 31.5* 32.0 - 36.0 g/dL Final  . RDW 03/05/2016 17.5* 11.5 - 14.5 % Final  . Platelets 03/05/2016 421  150 - 440 K/uL Final  . Neutrophils Relative % 03/05/2016 63   Final  . Neutro Abs 03/05/2016 5.8  1.4 - 6.5 K/uL Final  . Lymphocytes Relative 03/05/2016 29   Final  . Lymphs Abs 03/05/2016 2.7  1.0 - 3.6 K/uL Final  . Monocytes Relative 03/05/2016 6   Final  . Monocytes Absolute 03/05/2016 0.6  0.2 - 0.9 K/uL Final  . Eosinophils Relative 03/05/2016 1   Final  . Eosinophils Absolute 03/05/2016 0.1  0 - 0.7 K/uL Final  . Basophils Relative 03/05/2016 1   Final  . Basophils Absolute 03/05/2016 0.1  0 - 0.1 K/uL Final    STUDIES: No results found.  ASSESSMENT: Iron deficiency anemia  PLAN:    1. Iron deficiency anemia: Last IV iron was January 2016. Her HGB is 9.5 today Her iron studies are decreased today. Iron sats 5 and ferritin 7.  Proceed with two doses 510 mg IV Feraheme separated by one week. Return to clinic in 6 months with labs and then in 12 months with  lab, MD and feraheme. She will get her labs a day or two early so that results will be back for her visit.     Patient expressed understanding and was in agreement with this plan. She also understands that She can call clinic at any time with any questions, concerns, or complaints.   Dr. Rogue Bussing was available for consultation and review of plan of care for this patient.   Mayra Reel, NP   03/05/2016 11:16 AM

## 2016-03-05 NOTE — Progress Notes (Signed)
Patient here today for follow up. No concerns today.  °

## 2016-03-09 ENCOUNTER — Inpatient Hospital Stay: Payer: BLUE CROSS/BLUE SHIELD

## 2016-03-09 VITALS — BP 115/78 | HR 79 | Temp 97.0°F | Resp 20

## 2016-03-09 DIAGNOSIS — D509 Iron deficiency anemia, unspecified: Secondary | ICD-10-CM

## 2016-03-09 DIAGNOSIS — I10 Essential (primary) hypertension: Secondary | ICD-10-CM | POA: Diagnosis not present

## 2016-03-09 DIAGNOSIS — F909 Attention-deficit hyperactivity disorder, unspecified type: Secondary | ICD-10-CM | POA: Diagnosis not present

## 2016-03-09 DIAGNOSIS — G2581 Restless legs syndrome: Secondary | ICD-10-CM | POA: Diagnosis not present

## 2016-03-09 DIAGNOSIS — Z79899 Other long term (current) drug therapy: Secondary | ICD-10-CM | POA: Diagnosis not present

## 2016-03-09 DIAGNOSIS — F419 Anxiety disorder, unspecified: Secondary | ICD-10-CM | POA: Diagnosis not present

## 2016-03-09 DIAGNOSIS — F4310 Post-traumatic stress disorder, unspecified: Secondary | ICD-10-CM | POA: Diagnosis not present

## 2016-03-09 DIAGNOSIS — M797 Fibromyalgia: Secondary | ICD-10-CM | POA: Diagnosis not present

## 2016-03-09 DIAGNOSIS — F329 Major depressive disorder, single episode, unspecified: Secondary | ICD-10-CM | POA: Diagnosis not present

## 2016-03-09 DIAGNOSIS — B20 Human immunodeficiency virus [HIV] disease: Secondary | ICD-10-CM | POA: Diagnosis not present

## 2016-03-09 MED ORDER — SODIUM CHLORIDE 0.9 % IV SOLN
510.0000 mg | Freq: Once | INTRAVENOUS | Status: AC
  Administered 2016-03-09: 510 mg via INTRAVENOUS
  Filled 2016-03-09: qty 17

## 2016-03-09 MED ORDER — SODIUM CHLORIDE 0.9 % IV SOLN
Freq: Once | INTRAVENOUS | Status: AC
  Administered 2016-03-09: 14:00:00 via INTRAVENOUS
  Filled 2016-03-09: qty 1000

## 2016-03-16 ENCOUNTER — Inpatient Hospital Stay: Payer: BLUE CROSS/BLUE SHIELD

## 2016-03-16 VITALS — BP 125/80 | HR 84 | Temp 97.8°F

## 2016-03-16 DIAGNOSIS — F4310 Post-traumatic stress disorder, unspecified: Secondary | ICD-10-CM | POA: Diagnosis not present

## 2016-03-16 DIAGNOSIS — D509 Iron deficiency anemia, unspecified: Secondary | ICD-10-CM | POA: Diagnosis not present

## 2016-03-16 DIAGNOSIS — F419 Anxiety disorder, unspecified: Secondary | ICD-10-CM | POA: Diagnosis not present

## 2016-03-16 DIAGNOSIS — M797 Fibromyalgia: Secondary | ICD-10-CM | POA: Diagnosis not present

## 2016-03-16 DIAGNOSIS — G2581 Restless legs syndrome: Secondary | ICD-10-CM | POA: Diagnosis not present

## 2016-03-16 DIAGNOSIS — F909 Attention-deficit hyperactivity disorder, unspecified type: Secondary | ICD-10-CM | POA: Diagnosis not present

## 2016-03-16 DIAGNOSIS — F329 Major depressive disorder, single episode, unspecified: Secondary | ICD-10-CM | POA: Diagnosis not present

## 2016-03-16 DIAGNOSIS — I10 Essential (primary) hypertension: Secondary | ICD-10-CM | POA: Diagnosis not present

## 2016-03-16 DIAGNOSIS — B20 Human immunodeficiency virus [HIV] disease: Secondary | ICD-10-CM | POA: Diagnosis not present

## 2016-03-16 DIAGNOSIS — Z79899 Other long term (current) drug therapy: Secondary | ICD-10-CM | POA: Diagnosis not present

## 2016-03-16 MED ORDER — SODIUM CHLORIDE 0.9 % IV SOLN
510.0000 mg | Freq: Once | INTRAVENOUS | Status: AC
  Administered 2016-03-16: 510 mg via INTRAVENOUS
  Filled 2016-03-16: qty 17

## 2016-03-16 MED ORDER — SODIUM CHLORIDE 0.9 % IV SOLN
Freq: Once | INTRAVENOUS | Status: AC
  Administered 2016-03-16: 14:00:00 via INTRAVENOUS
  Filled 2016-03-16: qty 1000

## 2016-04-11 ENCOUNTER — Other Ambulatory Visit: Payer: Self-pay | Admitting: Family Medicine

## 2016-04-16 ENCOUNTER — Encounter: Payer: Self-pay | Admitting: Family Medicine

## 2016-04-16 ENCOUNTER — Ambulatory Visit (INDEPENDENT_AMBULATORY_CARE_PROVIDER_SITE_OTHER): Payer: BLUE CROSS/BLUE SHIELD | Admitting: Family Medicine

## 2016-04-16 VITALS — BP 126/88 | HR 84 | Temp 98.2°F | Ht 62.0 in | Wt 272.7 lb

## 2016-04-16 DIAGNOSIS — E559 Vitamin D deficiency, unspecified: Secondary | ICD-10-CM | POA: Diagnosis not present

## 2016-04-16 DIAGNOSIS — E8881 Metabolic syndrome: Secondary | ICD-10-CM | POA: Diagnosis not present

## 2016-04-16 DIAGNOSIS — E038 Other specified hypothyroidism: Secondary | ICD-10-CM

## 2016-04-16 DIAGNOSIS — E785 Hyperlipidemia, unspecified: Secondary | ICD-10-CM | POA: Diagnosis not present

## 2016-04-16 DIAGNOSIS — Z114 Encounter for screening for human immunodeficiency virus [HIV]: Secondary | ICD-10-CM

## 2016-04-16 DIAGNOSIS — Z79899 Other long term (current) drug therapy: Secondary | ICD-10-CM | POA: Diagnosis not present

## 2016-04-16 DIAGNOSIS — I10 Essential (primary) hypertension: Secondary | ICD-10-CM

## 2016-04-16 DIAGNOSIS — R971 Elevated cancer antigen 125 [CA 125]: Secondary | ICD-10-CM | POA: Diagnosis not present

## 2016-04-16 DIAGNOSIS — F339 Major depressive disorder, recurrent, unspecified: Secondary | ICD-10-CM | POA: Diagnosis not present

## 2016-04-16 MED ORDER — LEVOTHYROXINE SODIUM 25 MCG PO TABS: 25.0000 ug | ORAL_TABLET | Freq: Every day | ORAL | 0 refills | Status: DC

## 2016-04-16 MED ORDER — SPIRONOLACTONE-HCTZ 25-25 MG PO TABS: ORAL_TABLET | ORAL | 1 refills | Status: DC
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
## 2016-04-18 DIAGNOSIS — E785 Hyperlipidemia, unspecified: Secondary | ICD-10-CM | POA: Diagnosis not present

## 2016-04-18 DIAGNOSIS — E038 Other specified hypothyroidism: Secondary | ICD-10-CM | POA: Diagnosis not present

## 2016-04-18 DIAGNOSIS — E559 Vitamin D deficiency, unspecified: Secondary | ICD-10-CM | POA: Diagnosis not present

## 2016-04-18 DIAGNOSIS — I10 Essential (primary) hypertension: Secondary | ICD-10-CM | POA: Diagnosis not present

## 2016-04-24 ENCOUNTER — Ambulatory Visit: Payer: BLUE CROSS/BLUE SHIELD | Admitting: Psychiatry

## 2016-04-25 ENCOUNTER — Other Ambulatory Visit: Payer: Self-pay | Admitting: Psychiatry

## 2016-04-25 MED ORDER — DESVENLAFAXINE SUCCINATE ER 50 MG PO TB24: 50.0000 mg | ORAL_TABLET | Freq: Every day | ORAL | 0 refills | Status: DC

## 2016-04-26 ENCOUNTER — Encounter: Payer: Self-pay | Admitting: Family Medicine

## 2016-05-04 ENCOUNTER — Encounter: Payer: Self-pay | Admitting: Psychiatry

## 2016-05-04 ENCOUNTER — Ambulatory Visit (INDEPENDENT_AMBULATORY_CARE_PROVIDER_SITE_OTHER): Payer: BLUE CROSS/BLUE SHIELD | Admitting: Psychiatry

## 2016-05-04 VITALS — BP 142/85 | Temp 98.7°F | Ht 62.0 in | Wt 256.0 lb

## 2016-05-04 DIAGNOSIS — F330 Major depressive disorder, recurrent, mild: Secondary | ICD-10-CM

## 2016-05-04 DIAGNOSIS — D509 Iron deficiency anemia, unspecified: Secondary | ICD-10-CM

## 2016-05-04 DIAGNOSIS — F4312 Post-traumatic stress disorder, chronic: Secondary | ICD-10-CM

## 2016-05-04 MED ORDER — ARIPIPRAZOLE 5 MG PO TABS: 5.0000 mg | ORAL_TABLET | Freq: Every day | ORAL | 1 refills | Status: DC

## 2016-05-04 MED ORDER — BUTALBITAL-APAP-CAFF-COD 50-325-40-30 MG PO CAPS: 1.0000 | ORAL_CAPSULE | Freq: Four times a day (QID) | ORAL | 1 refills | Status: DC | PRN

## 2016-05-04 MED ORDER — DESVENLAFAXINE SUCCINATE ER 50 MG PO TB24: 50.0000 mg | ORAL_TABLET | Freq: Every day | ORAL | 1 refills | Status: DC

## 2016-05-04 MED ORDER — CLONAZEPAM 0.5 MG PO TABS: 0.5000 mg | ORAL_TABLET | Freq: Every day | ORAL | 2 refills | Status: DC

## 2016-05-04 NOTE — Progress Notes (Signed)
BH MD/PA/NP OP Progress Note  05/04/2016 8:56 AM Heather Jefferson  MRN:  [id number]  Subjective:    Patient is a 45 year old female who presented for the follow-up appointment. She reported that she is currently experiencing headache and her blood pressure is elevated. She reported that she is stressed out as she is unemployed for the past 2 years and is having financial problems. She reported that she has taking her blood pressure medication this morning. Patient reported that she is trying to get her financial situation situated. Patient reported that she has been compliant with her medications but she ran out of her medication for the migraine headache and wants a refill. She reported that she sleeps well with the help of Klonopin but she wakes up in the middle of the night. We discussed about her medications and she was advised to take the melatonin on a when necessary basis and she agreed with the plan. She currently denied having any suicidal homicidal ideations or plans. She appeared calm and collective during the interview. She reported that the Pristiq 50 mg is helping her and she is not feeling anxious on the medication.  Patient currently denied having any suicidal ideations or plans. She does not have any perceptual disturbances at this time.   Chief Complaint:  Chief Complaint    Follow-up; Medication Refill     Visit Diagnosis:   No diagnosis found.  Past Medical History:  Past Medical History:  Diagnosis Date  . ADHD (attention deficit hyperactivity disorder)   . Allergy   . Anemia   . Anxiety   . Depression   . Fibromyalgia   . Generalized anxiety disorder   . Headache   . HIV infection ([place])   . Hypertension   . Major depressive disorder, recurrent episode, moderate ([place])   . Migraine with acute onset aura   . Persistent disorder of initiating or maintaining sleep   . PTSD (post-traumatic stress disorder)   . Restless leg   . Seizure disorder ([place])   .  Thyroid disease   . Vitamin D deficiency     Past Surgical History:  Procedure Laterality Date  . EYE SURGERY    . TUBAL LIGATION     Family History:  Family History  Problem Relation Age of Onset  . Hypertension Mother   . Heart attack Mother   . CAD Mother   . Diabetes Father   . Hypertension Father   . Hypertension Sister   . Anxiety disorder Brother   . Depression Brother    Social History:  Social History   Social History  . Marital status: Married    Spouse name: N/A  . Number of children: N/A  . Years of education: N/A   Social History Main Topics  . Smoking status: Never Smoker  . Smokeless tobacco: Never Used  . Alcohol use 0.0 oz/week     Comment: social drinker  . Drug use: No  . Sexual activity: Yes    Partners: Male    Birth control/ protection: Other-see comments     Comment: Tubal Ligation   Other Topics Concern  . None   Social History Narrative  . None   Additional History:  She currently lives with her husband and 2 children ages 71 and 65 years old. She has good relationship with them.  Assessment:   Musculoskeletal: Strength & Muscle Tone: within normal limits Gait & Station: normal Patient leans: N/A  Psychiatric Specialty Exam: Anxiety  Medication Refill     ROS  Blood pressure (!) 142/85, temperature 98.7 F (37.1 C), temperature source Oral, height 5\' 2"  (1.575 m), weight 256 lb (116.1 kg), last menstrual period 03/03/2016.Body mass index is 46.82 kg/m.  General Appearance: Casual  Eye Contact:  Fair  Speech:  Clear and Coherent  Volume:  Normal  Mood:  Anxious and Depressed  Affect:  Congruent  Thought Process:  Logical  Orientation:  Full (Time, Place, and Person)  Thought Content:  WDL  Suicidal Thoughts:  No  Homicidal Thoughts:  No  Memory:  Immediate;   Fair  Judgement:  Fair  Insight:  Fair  Psychomotor Activity:  Decreased  Concentration:  Fair  Recall:  [employer] of Knowledge: Fair  Language:  Fair  Akathisia:  No  Handed:  Right  AIMS (if indicated):  none  Assets:  Communication Skills Desire for Improvement Physical Health  ADL's:  Intact  Cognition: WNL  Sleep:  6-8   Is the patient at risk to self?  No. Has the patient been a risk to self in the past 6 months?  No. Has the patient been a risk to self within the distant past?  No. Is the patient a risk to others?  No. Has the patient been a risk to others in the past 6 months?  No. Has the patient been a risk to others within the distant past?  No.  Current Medications: Current Outpatient Prescriptions  Medication Sig Dispense Refill  . ARIPiprazole (ABILIFY) 5 MG tablet Take 1 tablet (5 mg total) by mouth daily. 90 tablet 1  . butalbital-acetaminophen-caffeine (FIORICET WITH CODEINE) 50-325-40-30 MG capsule Take by mouth.    . clonazePAM (KLONOPIN) 0.5 MG tablet Take 1 tablet (0.5 mg total) by mouth at bedtime. 30 tablet 2  . desvenlafaxine (PRISTIQ) 50 MG 24 hr tablet Take 1 tablet (50 mg total) by mouth daily. 30 tablet 0  . hydroxychloroquine (PLAQUENIL) 200 MG tablet Take by mouth.    . levothyroxine (SYNTHROID, LEVOTHROID) 25 MCG tablet Take 1 tablet (25 mcg total) by mouth daily. 30 tablet 0  . prednisoLONE acetate (PRED FORTE) 1 % ophthalmic suspension Apply to eye.    . spironolactone-hydrochlorothiazide (ALDACTAZIDE) 25-25 MG tablet TAKE 1 TABLET DAILY FOR BLOOD PRESSURE. STOP LOSARTAN. 90 tablet 1  . SUMAtriptan (IMITREX) 20 MG/ACT nasal spray Place into the nose.     No current facility-administered medications for this visit.     Medical Decision Making:  Established Problem, Stable/Improving (1), Review of Psycho-Social Stressors (1) and Review of Medication Regimen & Side Effects (2)  Treatment Plan Summary:Medication management     Discussed with patient about her medications treatment risks benefits and alternatives.  She will continue on Pristiq 50 mg in the morning and   Abilify to 5 mg  daily. Continue Klonopin 0.5 mg by mouth daily at bedtime when necessary. I will also refill her Fioricet for the migraine headache.   She will follow-up in 3 month.      More than 50% of the time spent in psychoeducation, counseling and coordination of care.    This note was generated in part or whole with voice recognition software. Voice regonition is usually quite accurate but there are transcription errors that can and very often do occur. I apologize for any typographical errors that were not detected and corrected.   Rainey Pines, MD  05/04/2016, 8:56 AM

## 2016-05-29 ENCOUNTER — Telehealth: Payer: Self-pay

## 2016-05-29 NOTE — Telephone Encounter (Signed)
pt states she needs some sample of pristiq she has mail order and it will be a few days before she gets it.

## 2016-05-30 NOTE — Telephone Encounter (Signed)
pt came by and picked up samples of pristiq  Lot # [id number] exp 8-18

## 2016-05-31 ENCOUNTER — Other Ambulatory Visit: Payer: Self-pay | Admitting: Family Medicine

## 2016-05-31 DIAGNOSIS — I10 Essential (primary) hypertension: Secondary | ICD-10-CM

## 2016-05-31 MED ORDER — SPIRONOLACTONE-HCTZ 25-25 MG PO TABS: ORAL_TABLET | ORAL | 1 refills | Status: DC

## 2016-06-01 ENCOUNTER — Other Ambulatory Visit: Payer: Self-pay

## 2016-06-01 DIAGNOSIS — I10 Essential (primary) hypertension: Secondary | ICD-10-CM

## 2016-06-01 NOTE — Telephone Encounter (Signed)
Patient needs refill on Sp[facility]actone[facility]

## 2016-06-03 MED ORDER — SPIRONOLACTONE-HCTZ 25-25 MG PO TABS: ORAL_TABLET | ORAL | 1 refills | Status: DC

## 2016-06-11 ENCOUNTER — Other Ambulatory Visit: Payer: Self-pay

## 2016-06-11 NOTE — Telephone Encounter (Signed)
[facility]  [facility] over [facility].  [facility]. needs to be sent directly.

## 2016-06-12 MED ORDER — DESVENLAFAXINE SUCCINATE ER 50 MG PO TB24: 50.0000 mg | ORAL_TABLET | Freq: Every day | ORAL | 1 refills | Status: DC

## 2016-06-12 MED ORDER — ARIPIPRAZOLE 5 MG PO TABS: 5.0000 mg | ORAL_TABLET | Freq: Every day | ORAL | 1 refills | Status: DC

## 2016-06-12 MED ORDER — CLONAZEPAM 0.5 MG PO TABS: 0.5000 mg | ORAL_TABLET | Freq: Every day | ORAL | 2 refills | Status: DC

## 2016-06-12 NOTE — Telephone Encounter (Signed)
faxed and confirmed rx for klonopin .5mg  id # [id number] order # [id number]

## 2016-07-05 ENCOUNTER — Other Ambulatory Visit: Payer: Self-pay | Admitting: Family Medicine

## 2016-07-05 DIAGNOSIS — E038 Other specified hypothyroidism: Secondary | ICD-10-CM

## 2016-07-05 NOTE — Telephone Encounter (Signed)
Patient requesting refill of Levothyroxine to [facility]. 

## 2016-08-28 ENCOUNTER — Ambulatory Visit: Payer: BLUE CROSS/BLUE SHIELD | Admitting: Psychiatry

## 2016-08-29 ENCOUNTER — Ambulatory Visit (INDEPENDENT_AMBULATORY_CARE_PROVIDER_SITE_OTHER): Payer: BLUE CROSS/BLUE SHIELD | Admitting: Psychiatry

## 2016-08-29 ENCOUNTER — Encounter: Payer: Self-pay | Admitting: Psychiatry

## 2016-08-29 VITALS — BP 138/96 | Temp 98.3°F | Wt 258.0 lb

## 2016-08-29 DIAGNOSIS — E785 Hyperlipidemia, unspecified: Secondary | ICD-10-CM | POA: Insufficient documentation

## 2016-08-29 DIAGNOSIS — F4312 Post-traumatic stress disorder, chronic: Secondary | ICD-10-CM | POA: Diagnosis not present

## 2016-08-29 DIAGNOSIS — F330 Major depressive disorder, recurrent, mild: Secondary | ICD-10-CM

## 2016-08-29 DIAGNOSIS — D649 Anemia, unspecified: Secondary | ICD-10-CM | POA: Insufficient documentation

## 2016-08-29 MED ORDER — CLONAZEPAM 0.5 MG PO TABS: 0.5000 mg | ORAL_TABLET | Freq: Every day | ORAL | 2 refills | Status: DC

## 2016-08-29 NOTE — Progress Notes (Signed)
BH MD/PA/NP OP Progress Note  08/29/2016 8:47 AM Heather Jefferson  MRN:  [id number]  Subjective:    Patient is a 45 year old female who presented for the follow-up appointment. She appeared very tired during the interview. She reported that she was not sleeping well at night. She reported that she has been compliant with her medications. She stated that she is still tired during the Thanksgiving as well. She gets blood infusions on a regular basis. Patient reported that the medications are helping her. Patient reported that she has started taking the Topamax on a regular basis. She sleeps well with the help of the Klonopin. She currently denied having any suicidal homicidal ideations or plans. She is going to have her labs done next week and will follow-up with her primary care physician. She denied having any side effects of the medications. She appeared calm and collected during the interview. No perceptual disturbances noted at this time.   Patient currently denied having any suicidal ideations or plans. She does not have any perceptual disturbances at this time.   Chief Complaint:  Chief Complaint    Follow-up; Medication Refill     Visit Diagnosis:   No diagnosis found.  Past Medical History:  Past Medical History:  Diagnosis Date  . ADHD (attention deficit hyperactivity disorder)   . Allergy   . Anemia   . Anxiety   . Depression   . Fibromyalgia   . Generalized anxiety disorder   . Headache   . HIV infection ([place])   . Hypertension   . Major depressive disorder, recurrent episode, moderate ([place])   . Migraine with acute onset aura   . Persistent disorder of initiating or maintaining sleep   . PTSD (post-traumatic stress disorder)   . Restless leg   . Seizure disorder ([place])   . Thyroid disease   . Vitamin D deficiency     Past Surgical History:  Procedure Laterality Date  . EYE SURGERY    . TUBAL LIGATION     Family History:  Family History  Problem Relation Age  of Onset  . Hypertension Mother   . Heart attack Mother   . CAD Mother   . Diabetes Father   . Hypertension Father   . Hypertension Sister   . Anxiety disorder Brother   . Depression Brother    Social History:  Social History   Social History  . Marital status: Married    Spouse name: N/A  . Number of children: N/A  . Years of education: N/A   Social History Main Topics  . Smoking status: Never Smoker  . Smokeless tobacco: Never Used  . Alcohol use 0.0 oz/week     Comment: social drinker  . Drug use: No  . Sexual activity: Yes    Partners: Male    Birth control/ protection: Other-see comments     Comment: Tubal Ligation   Other Topics Concern  . None   Social History Narrative  . None   Additional History:  She currently lives with her husband and 2 children ages 36 and 44 years old. She has good relationship with them.  Assessment:   Musculoskeletal: Strength & Muscle Tone: within normal limits Gait & Station: normal Patient leans: N/A  Psychiatric Specialty Exam: Medication Refill   Anxiety       ROS  Blood pressure (!) 138/96, temperature 98.3 F (36.8 C), temperature source Oral, weight 258 lb (117 kg), last menstrual period 08/01/2016.Body mass index is 47.19 kg/m.  General Appearance: Casual  Eye Contact:  Fair  Speech:  Clear and Coherent  Volume:  Normal  Mood:  Anxious and Depressed  Affect:  Congruent  Thought Process:  Logical  Orientation:  Full (Time, Place, and Person)  Thought Content:  WDL  Suicidal Thoughts:  No  Homicidal Thoughts:  No  Memory:  Immediate;   Fair  Judgement:  Fair  Insight:  Fair  Psychomotor Activity:  Decreased  Concentration:  Fair  Recall:  [employer] of Knowledge: Fair  Language: Fair  Akathisia:  No  Handed:  Right  AIMS (if indicated):  none  Assets:  Communication Skills Desire for Improvement Physical Health  ADL's:  Intact  Cognition: WNL  Sleep:  6-8   Is the patient at risk to self?   No. Has the patient been a risk to self in the past 6 months?  No. Has the patient been a risk to self within the distant past?  No. Is the patient a risk to others?  No. Has the patient been a risk to others in the past 6 months?  No. Has the patient been a risk to others within the distant past?  No.  Current Medications: Current Outpatient Prescriptions  Medication Sig Dispense Refill  . ARIPiprazole (ABILIFY) 5 MG tablet Take 1 tablet (5 mg total) by mouth daily. 90 tablet 1  . butalbital-acetaminophen-caffeine (FIORICET WITH CODEINE) 50-325-40-30 MG capsule Take 1 capsule by mouth every 6 (six) hours as needed for headache. 30 capsule 1  . clonazePAM (KLONOPIN) 0.5 MG tablet Take 1 tablet (0.5 mg total) by mouth at bedtime. 30 tablet 2  . desvenlafaxine (PRISTIQ) 50 MG 24 hr tablet Take 1 tablet (50 mg total) by mouth daily. 90 tablet 1  . hydroxychloroquine (PLAQUENIL) 200 MG tablet Take by mouth.    . levothyroxine (SYNTHROID, LEVOTHROID) 25 MCG tablet TAKE ONE TABLET BY MOUTH ONCE DAILY 30 tablet 0  . prednisoLONE acetate (PRED FORTE) 1 % ophthalmic suspension Apply to eye.    . spironolactone-hydrochlorothiazide (ALDACTAZIDE) 25-25 MG tablet TAKE 1 TABLET DAILY FOR BLOOD PRESSURE. STOP LOSARTAN. 90 tablet 1  . SUMAtriptan (IMITREX) 20 MG/ACT nasal spray Place into the nose.    . SUMAtriptan (IMITREX) 20 MG/ACT nasal spray as needed.      No current facility-administered medications for this visit.     Medical Decision Making:  Established Problem, Stable/Improving (1), Review of Psycho-Social Stressors (1) and Review of Medication Regimen & Side Effects (2)  Treatment Plan Summary:Medication management     Discussed with patient about her medications treatment risks benefits and alternatives.  She will continue on Pristiq 50 mg in the morning and   Abilify to 5 mg daily. Continue Klonopin 0.5 mg by mouth daily at bedtime when necessary.  She will follow-up in 3  month.      More than 50% of the time spent in psychoeducation, counseling and coordination of care.    This note was generated in part or whole with voice recognition software. Voice regonition is usually quite accurate but there are transcription errors that can and very often do occur. I apologize for any typographical errors that were not detected and corrected.   Rainey Pines, MD  08/29/2016, 8:47 AM

## 2016-08-31 ENCOUNTER — Other Ambulatory Visit: Payer: Self-pay | Admitting: Family Medicine

## 2016-08-31 DIAGNOSIS — E038 Other specified hypothyroidism: Secondary | ICD-10-CM

## 2016-09-05 ENCOUNTER — Inpatient Hospital Stay: Payer: BLUE CROSS/BLUE SHIELD | Attending: Internal Medicine

## 2016-09-05 DIAGNOSIS — F419 Anxiety disorder, unspecified: Secondary | ICD-10-CM | POA: Insufficient documentation

## 2016-09-05 DIAGNOSIS — F329 Major depressive disorder, single episode, unspecified: Secondary | ICD-10-CM | POA: Diagnosis not present

## 2016-09-05 DIAGNOSIS — Z79899 Other long term (current) drug therapy: Secondary | ICD-10-CM | POA: Insufficient documentation

## 2016-09-05 DIAGNOSIS — F4310 Post-traumatic stress disorder, unspecified: Secondary | ICD-10-CM | POA: Insufficient documentation

## 2016-09-05 DIAGNOSIS — D509 Iron deficiency anemia, unspecified: Secondary | ICD-10-CM | POA: Diagnosis not present

## 2016-09-05 DIAGNOSIS — I10 Essential (primary) hypertension: Secondary | ICD-10-CM | POA: Insufficient documentation

## 2016-09-05 DIAGNOSIS — G2581 Restless legs syndrome: Secondary | ICD-10-CM | POA: Insufficient documentation

## 2016-09-05 DIAGNOSIS — M797 Fibromyalgia: Secondary | ICD-10-CM | POA: Insufficient documentation

## 2016-09-05 DIAGNOSIS — F909 Attention-deficit hyperactivity disorder, unspecified type: Secondary | ICD-10-CM | POA: Diagnosis not present

## 2016-09-05 DIAGNOSIS — B20 Human immunodeficiency virus [HIV] disease: Secondary | ICD-10-CM | POA: Insufficient documentation

## 2016-09-05 LAB — IRON AND TIBC
Iron: 106 ug/dL (ref 28–170)
Saturation Ratios: 26 % (ref 10.4–31.8)
TIBC: 415 ug/dL (ref 250–450)
UIBC: 309 ug/dL

## 2016-09-05 LAB — CBC WITH DIFFERENTIAL/PLATELET
BASOS ABS: 0.1 10*3/uL (ref 0–0.1)
Basophils Relative: 1 %
Eosinophils Absolute: 0.1 10*3/uL (ref 0–0.7)
Eosinophils Relative: 1 %
HEMATOCRIT: 39.2 % (ref 35.0–47.0)
Hemoglobin: 13.4 g/dL (ref 12.0–16.0)
LYMPHS PCT: 31 %
Lymphs Abs: 2.8 10*3/uL (ref 1.0–3.6)
MCH: 28.9 pg (ref 26.0–34.0)
MCHC: 34.3 g/dL (ref 32.0–36.0)
MCV: 84.3 fL (ref 80.0–100.0)
MONO ABS: 0.5 10*3/uL (ref 0.2–0.9)
MONOS PCT: 6 %
NEUTROS ABS: 5.7 10*3/uL (ref 1.4–6.5)
Neutrophils Relative %: 61 %
Platelets: 292 10*3/uL (ref 150–440)
RBC: 4.65 MIL/uL (ref 3.80–5.20)
RDW: 15.1 % — AB (ref 11.5–14.5)
WBC: 9.1 10*3/uL (ref 3.6–11.0)

## 2016-09-05 LAB — FERRITIN: Ferritin: 22 ng/mL (ref 11–307)

## 2016-09-10 ENCOUNTER — Telehealth: Payer: Self-pay | Admitting: *Deleted

## 2016-09-10 NOTE — Telephone Encounter (Signed)
Spoke with patient. Patient made aware of lab values. She understands that she does not need any IV iron. She was instructed to keep her sch. Apt in June 2018.

## 2016-09-10 NOTE — Telephone Encounter (Signed)
-----   Message from Cammie Sickle, MD sent at 09/07/2016  5:02 PM EST ----- Inform pt that labs look- good; no plan for IV iron at this time; otherwise follow up as planned. Dr.B

## 2016-10-09 ENCOUNTER — Other Ambulatory Visit: Payer: Self-pay | Admitting: Family Medicine

## 2016-10-09 DIAGNOSIS — E039 Hypothyroidism, unspecified: Secondary | ICD-10-CM

## 2016-10-09 DIAGNOSIS — E038 Other specified hypothyroidism: Secondary | ICD-10-CM

## 2016-10-13 ENCOUNTER — Other Ambulatory Visit: Payer: Self-pay | Admitting: Family Medicine

## 2016-10-13 DIAGNOSIS — E038 Other specified hypothyroidism: Secondary | ICD-10-CM

## 2016-10-15 NOTE — Telephone Encounter (Signed)
Patient requesting refill of Levothyroxine to [facility]. 

## 2016-10-16 NOTE — Telephone Encounter (Signed)
Called in a 2 days supply to [facility] for patient, unable to reach patient due to no voicemail and the other number has been disconnected.

## 2016-10-16 NOTE — Telephone Encounter (Signed)
Emergency supply only

## 2016-10-18 ENCOUNTER — Ambulatory Visit (INDEPENDENT_AMBULATORY_CARE_PROVIDER_SITE_OTHER): Payer: BLUE CROSS/BLUE SHIELD | Admitting: Family Medicine

## 2016-10-18 ENCOUNTER — Encounter: Payer: Self-pay | Admitting: Family Medicine

## 2016-10-18 VITALS — BP 118/84 | HR 112 | Temp 98.2°F | Resp 16 | Ht 62.0 in | Wt 247.2 lb

## 2016-10-18 DIAGNOSIS — F339 Major depressive disorder, recurrent, unspecified: Secondary | ICD-10-CM | POA: Diagnosis not present

## 2016-10-18 DIAGNOSIS — E785 Hyperlipidemia, unspecified: Secondary | ICD-10-CM | POA: Diagnosis not present

## 2016-10-18 DIAGNOSIS — R634 Abnormal weight loss: Secondary | ICD-10-CM | POA: Diagnosis not present

## 2016-10-18 DIAGNOSIS — E559 Vitamin D deficiency, unspecified: Secondary | ICD-10-CM

## 2016-10-18 DIAGNOSIS — I10 Essential (primary) hypertension: Secondary | ICD-10-CM | POA: Diagnosis not present

## 2016-10-18 DIAGNOSIS — Z23 Encounter for immunization: Secondary | ICD-10-CM

## 2016-10-18 DIAGNOSIS — E8881 Metabolic syndrome: Secondary | ICD-10-CM | POA: Diagnosis not present

## 2016-10-18 DIAGNOSIS — I7300 Raynaud's syndrome without gangrene: Secondary | ICD-10-CM | POA: Diagnosis not present

## 2016-10-18 DIAGNOSIS — M05741 Rheumatoid arthritis with rheumatoid factor of right hand without organ or systems involvement: Secondary | ICD-10-CM

## 2016-10-18 DIAGNOSIS — D500 Iron deficiency anemia secondary to blood loss (chronic): Secondary | ICD-10-CM | POA: Diagnosis not present

## 2016-10-18 DIAGNOSIS — N921 Excessive and frequent menstruation with irregular cycle: Secondary | ICD-10-CM

## 2016-10-18 DIAGNOSIS — M05742 Rheumatoid arthritis with rheumatoid factor of left hand without organ or systems involvement: Secondary | ICD-10-CM

## 2016-10-18 DIAGNOSIS — E038 Other specified hypothyroidism: Secondary | ICD-10-CM

## 2016-10-18 MED ORDER — LEVOTHYROXINE SODIUM 25 MCG PO TABS: 25.0000 ug | ORAL_TABLET | Freq: Every day | ORAL | 0 refills | Status: DC

## 2016-10-18 MED ORDER — NORGESTIMATE-ETH ESTRADIOL 0.25-35 MG-MCG PO TABS: 1.0000 | ORAL_TABLET | Freq: Every day | ORAL | 11 refills | Status: DC

## 2016-10-18 MED ORDER — SPIRONOLACTONE-HCTZ 25-25 MG PO TABS: ORAL_TABLET | ORAL | 1 refills | Status: DC
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
## 2016-10-19 LAB — COMPLETE METABOLIC PANEL WITH GFR
ALBUMIN: 4.1 g/dL (ref 3.6–5.1)
ALT: 24 U/L (ref 6–29)
AST: 26 U/L (ref 10–35)
Alkaline Phosphatase: 68 U/L (ref 33–115)
BUN: 10 mg/dL (ref 7–25)
CALCIUM: 9.7 mg/dL (ref 8.6–10.2)
CHLORIDE: 93 mmol/L — AB (ref 98–110)
CO2: 33 mmol/L — ABNORMAL HIGH (ref 20–31)
CREATININE: 0.7 mg/dL (ref 0.50–1.10)
GFR, Est African American: >89 mL/min (ref >=60)
GFR, Est Non African American: >89 mL/min (ref >=60)
GLUCOSE: 117 mg/dL — AB (ref 65–99)
Potassium: 3.7 mmol/L (ref 3.5–5.3)
SODIUM: 140 mmol/L (ref 135–146)
Total Bilirubin: 0.5 mg/dL (ref 0.2–1.2)
Total Protein: 7.5 g/dL (ref 6.1–8.1)

## 2016-10-19 LAB — CBC WITH DIFFERENTIAL/PLATELET
BASOS PCT: 0 %
Basophils Absolute: 0 cells/uL (ref 0–200)
EOS ABS: 68 {cells}/uL (ref 15–500)
EOS PCT: 1 %
HCT: 37.1 % (ref 35.0–45.0)
Hemoglobin: 12 g/dL (ref 11.7–15.5)
LYMPHS PCT: 33 %
Lymphs Abs: 2244 cells/uL (ref 850–3900)
MCH: 28.7 pg (ref 27.0–33.0)
MCHC: 32.3 g/dL (ref 32.0–36.0)
MCV: 88.8 fL (ref 80.0–100.0)
MONOS PCT: 7 %
MPV: 9.6 fL (ref 7.5–12.5)
Monocytes Absolute: 476 cells/uL (ref 200–950)
Neutro Abs: 4012 cells/uL (ref 1500–7800)
Neutrophils Relative %: 59 %
PLATELETS: 392 10*3/uL (ref 140–400)
RBC: 4.18 MIL/uL (ref 3.80–5.10)
RDW: 15.3 % — AB (ref 11.0–15.0)
WBC: 6.8 10*3/uL (ref 3.8–10.8)

## 2016-10-19 LAB — INSULIN, FASTING: INSULIN FASTING, SERUM: 23.2 u[IU]/mL — AB (ref 2.0–19.6)

## 2016-10-19 LAB — VITAMIN D 25 HYDROXY (VIT D DEFICIENCY, FRACTURES): VIT D 25 HYDROXY: 27 ng/mL — AB (ref 30–100)

## 2016-10-19 LAB — FERRITIN: FERRITIN: 44 ng/mL (ref 10–232)

## 2016-10-19 LAB — LIPID PANEL
CHOLESTEROL: 160 mg/dL (ref <200)
HDL: 41 mg/dL — ABNORMAL LOW (ref >50)
LDL Cholesterol: 81 mg/dL (ref <100)
Total CHOL/HDL Ratio: 3.9 Ratio (ref <5.0)
Triglycerides: 191 mg/dL — ABNORMAL HIGH (ref <150)
VLDL: 38 mg/dL — AB (ref <30)

## 2016-10-19 LAB — VITAMIN B12: Vitamin B-12: 945 pg/mL (ref 200–1100)

## 2016-10-19 LAB — HEMOGLOBIN A1C
HEMOGLOBIN A1C: 5.4 % (ref <5.7)
MEAN PLASMA GLUCOSE: 108 mg/dL

## 2016-10-19 LAB — HIV ANTIBODY (ROUTINE TESTING W REFLEX): HIV 1&2 Ab, 4th Generation: NONREACTIVE

## 2016-10-19 LAB — TSH: TSH: 2.58 mIU/L

## 2016-10-22 DIAGNOSIS — H524 Presbyopia: Secondary | ICD-10-CM | POA: Diagnosis not present

## 2016-11-05 DIAGNOSIS — I7300 Raynaud's syndrome without gangrene: Secondary | ICD-10-CM | POA: Diagnosis not present

## 2016-11-05 DIAGNOSIS — M059 Rheumatoid arthritis with rheumatoid factor, unspecified: Secondary | ICD-10-CM | POA: Diagnosis not present

## 2016-11-05 DIAGNOSIS — Z79899 Other long term (current) drug therapy: Secondary | ICD-10-CM | POA: Diagnosis not present

## 2016-11-16 ENCOUNTER — Other Ambulatory Visit: Payer: Self-pay | Admitting: Family Medicine

## 2016-11-16 DIAGNOSIS — E038 Other specified hypothyroidism: Secondary | ICD-10-CM

## 2016-11-16 NOTE — Telephone Encounter (Signed)
Patient requesting refill of Levothyroxine to [facility]. 

## 2016-11-23 ENCOUNTER — Other Ambulatory Visit: Payer: Self-pay

## 2016-11-23 DIAGNOSIS — I10 Essential (primary) hypertension: Secondary | ICD-10-CM

## 2016-11-23 MED ORDER — SPIRONOLACTONE-HCTZ 25-25 MG PO TABS: ORAL_TABLET | ORAL | 1 refills | Status: DC

## 2016-11-23 NOTE — Telephone Encounter (Signed)
Patient requesting refill of Spironolactone-HCTZ to [facility].

## 2016-12-10 ENCOUNTER — Encounter: Payer: Self-pay | Admitting: Psychiatry

## 2016-12-10 ENCOUNTER — Ambulatory Visit (INDEPENDENT_AMBULATORY_CARE_PROVIDER_SITE_OTHER): Payer: BLUE CROSS/BLUE SHIELD | Admitting: Psychiatry

## 2016-12-10 VITALS — BP 128/78 | HR 106 | Temp 98.8°F | Wt 244.2 lb

## 2016-12-10 DIAGNOSIS — F4312 Post-traumatic stress disorder, chronic: Secondary | ICD-10-CM | POA: Diagnosis not present

## 2016-12-10 DIAGNOSIS — F331 Major depressive disorder, recurrent, moderate: Secondary | ICD-10-CM | POA: Diagnosis not present

## 2016-12-10 MED ORDER — CLONAZEPAM 0.5 MG PO TABS: 0.5000 mg | ORAL_TABLET | Freq: Every day | ORAL | 2 refills | Status: DC

## 2016-12-10 MED ORDER — DESVENLAFAXINE SUCCINATE ER 50 MG PO TB24: 50.0000 mg | ORAL_TABLET | Freq: Every day | ORAL | 1 refills | Status: DC

## 2016-12-10 MED ORDER — ARIPIPRAZOLE 10 MG PO TABS: 10.0000 mg | ORAL_TABLET | Freq: Every day | ORAL | 1 refills | Status: DC

## 2016-12-10 NOTE — Progress Notes (Signed)
BH MD/PA/NP OP Progress Note  12/10/2016 1:59 PM Heather Jefferson  MRN:  [id number]  Subjective:    Patient is a 46 year old female who presented for the follow-up appointment. She was sad and tearful as she reported that her father had a stroke and he is currently in coma in [place]. She reported that she is planning to go to [place] as her sisters are with him at this time. She reported that he has history of coronary artery disease. Patient reported that she is doing well on her medications. She currently denied having any suicidal ideations or plans. She reported that she has been compliant with her medications. She stated that she has lost weight with the help of Topamax. She drinks a lot of water.   Patient reported that she continues to feel depressed and is interested in going higher on the dose of the Abilify. She reported that the Abilify has been helpful but she wants to go higher as her depression is not improving.  Chief Complaint:  Chief Complaint    Follow-up; Medication Refill     Visit Diagnosis:     ICD-9-CM ICD-10-CM   1. MDD (major depressive disorder), recurrent episode, moderate (HCC) 296.32 F33.1   2. Chronic post-traumatic stress disorder (PTSD) 309.81 F43.12     Past Medical History:  Past Medical History:  Diagnosis Date  . ADHD (attention deficit hyperactivity disorder)   . Allergy   . Anemia   . Anxiety   . Depression   . Fibromyalgia   . Generalized anxiety disorder   . Headache   . HIV infection ([place])   . Hypertension   . Major depressive disorder, recurrent episode, moderate ([place])   . Migraine with acute onset aura   . Persistent disorder of initiating or maintaining sleep   . PTSD (post-traumatic stress disorder)   . Restless leg   . Seizure disorder ([place])   . Thyroid disease   . Vitamin D deficiency     Past Surgical History:  Procedure Laterality Date  . EYE SURGERY    . TUBAL LIGATION     Family History:  Family History   Problem Relation Age of Onset  . Hypertension Mother   . Heart attack Mother   . CAD Mother   . Diabetes Father   . Hypertension Father   . Hypertension Sister   . Anxiety disorder Brother   . Depression Brother    Social History:  Social History   Social History  . Marital status: Married    Spouse name: N/A  . Number of children: N/A  . Years of education: N/A   Social History Main Topics  . Smoking status: Never Smoker  . Smokeless tobacco: Never Used  . Alcohol use 0.0 oz/week     Comment: social drinker  . Drug use: No  . Sexual activity: Yes    Partners: Male    Birth control/ protection: Other-see comments     Comment: Tubal Ligation   Other Topics Concern  . None   Social History Narrative  . None   Additional History:  She currently lives with her husband and 2 children ages 67 and 97 years old. She has good relationship with them.  Assessment:   Musculoskeletal: Strength & Muscle Tone: within normal limits Gait & Station: normal Patient leans: N/A  Psychiatric Specialty Exam: Medication Refill   Anxiety       ROS  Blood pressure 128/78, pulse (!) 106, temperature 98.8 F (  37.1 C), temperature source Oral, weight 244 lb 3.2 oz (110.8 kg), last menstrual period 11/19/2016.Body mass index is 44.66 kg/m.  General Appearance: Casual  Eye Contact:  Fair  Speech:  Clear and Coherent  Volume:  Normal  Mood:  Anxious and Depressed  Affect:  Congruent  Thought Process:  Logical  Orientation:  Full (Time, Place, and Person)  Thought Content:  WDL  Suicidal Thoughts:  No  Homicidal Thoughts:  No  Memory:  Immediate;   Fair  Judgement:  Fair  Insight:  Fair  Psychomotor Activity:  Decreased  Concentration:  Fair  Recall:  [employer] of Knowledge: Fair  Language: Fair  Akathisia:  No  Handed:  Right  AIMS (if indicated):  none  Assets:  Communication Skills Desire for Improvement Physical Health  ADL's:  Intact  Cognition: WNL  Sleep:   6-8   Is the patient at risk to self?  No. Has the patient been a risk to self in the past 6 months?  No. Has the patient been a risk to self within the distant past?  No. Is the patient a risk to others?  No. Has the patient been a risk to others in the past 6 months?  No. Has the patient been a risk to others within the distant past?  No.  Current Medications: Current Outpatient Prescriptions  Medication Sig Dispense Refill  . ARIPiprazole (ABILIFY) 10 MG tablet Take 1 tablet (10 mg total) by mouth daily. 90 tablet 1  . butalbital-acetaminophen-caffeine (FIORICET WITH CODEINE) 50-325-40-30 MG capsule Take 1 capsule by mouth every 6 (six) hours as needed for headache. 30 capsule 1  . clonazePAM (KLONOPIN) 0.5 MG tablet Take 1 tablet (0.5 mg total) by mouth at bedtime. 30 tablet 2  . desvenlafaxine (PRISTIQ) 50 MG 24 hr tablet Take 1 tablet (50 mg total) by mouth daily. 90 tablet 1  . hydroxychloroquine (PLAQUENIL) 200 MG tablet Take by mouth.    . levothyroxine (SYNTHROID, LEVOTHROID) 25 MCG tablet TAKE ONE TABLET BY MOUTH ONCE DAILY 30 tablet 1  . norgestimate-ethinyl estradiol (SPRINTEC 28) 0.25-35 MG-MCG tablet Take 1 tablet by mouth daily. 1 Package 11  . prednisoLONE acetate (PRED FORTE) 1 % ophthalmic suspension Apply to eye.    . spironolactone-hydrochlorothiazide (ALDACTAZIDE) 25-25 MG tablet TAKE 1 TABLET DAILY FOR BLOOD PRESSURE. STOP LOSARTAN. 90 tablet 1  . SUMAtriptan (IMITREX) 20 MG/ACT nasal spray Place into the nose.    . SUMAtriptan (IMITREX) 20 MG/ACT nasal spray as needed.     . topiramate (TOPAMAX) 50 MG tablet Take 50 mg by mouth daily. 1 pill in am and 3 pills  Pm      No current facility-administered medications for this visit.     Medical Decision Making:  Established Problem, Stable/Improving (1), Review of Psycho-Social Stressors (1) and Review of Medication Regimen & Side Effects (2)  Treatment Plan Summary:Medication management     Discussed with patient  about her medications treatment risks benefits and alternatives.  She will continue on Pristiq 50 mg in the morning  Abilify 10 mg daily at bedtime. Continue Klonopin 0.5 mg by mouth daily at bedtime when necessary.  She will follow-up in 3 month.   More than 50% of the time spent in psychoeducation, counseling and coordination of care.    This note was generated in part or whole with voice recognition software. Voice regonition is usually quite accurate but there are transcription errors that can and very often do occur. I  apologize for any typographical errors that were not detected and corrected.   Rainey Pines, MD  12/10/2016, 1:59 PM

## 2016-12-13 ENCOUNTER — Telehealth: Payer: Self-pay | Admitting: Family Medicine

## 2016-12-13 NOTE — Telephone Encounter (Signed)
Pt requesting return call pertaining to blood results [phone]

## 2016-12-14 NOTE — Telephone Encounter (Signed)
Spoke to pt and she was concerned that her lawyer had brought up a positive HIV result. I assured pt that we do not have anything like that documented in her chart and that I could print a copy of what we have in her chart and also went over recent test results with her.

## 2017-01-28 ENCOUNTER — Other Ambulatory Visit: Payer: Self-pay | Admitting: Family Medicine

## 2017-01-28 ENCOUNTER — Other Ambulatory Visit: Payer: Self-pay

## 2017-01-28 DIAGNOSIS — E038 Other specified hypothyroidism: Secondary | ICD-10-CM

## 2017-01-28 MED ORDER — LEVOTHYROXINE SODIUM 25 MCG PO TABS: 25.0000 ug | ORAL_TABLET | Freq: Every day | ORAL | 0 refills | Status: DC

## 2017-01-28 NOTE — Telephone Encounter (Signed)
Patient requesting refill of Levothyroxine to [facility]. 

## 2017-02-06 DIAGNOSIS — M059 Rheumatoid arthritis with rheumatoid factor, unspecified: Secondary | ICD-10-CM | POA: Diagnosis not present

## 2017-02-06 DIAGNOSIS — M797 Fibromyalgia: Secondary | ICD-10-CM | POA: Diagnosis not present

## 2017-02-06 DIAGNOSIS — I7300 Raynaud's syndrome without gangrene: Secondary | ICD-10-CM | POA: Diagnosis not present

## 2017-03-04 ENCOUNTER — Other Ambulatory Visit: Payer: BLUE CROSS/BLUE SHIELD

## 2017-03-06 ENCOUNTER — Ambulatory Visit: Payer: BLUE CROSS/BLUE SHIELD | Admitting: Internal Medicine

## 2017-03-06 ENCOUNTER — Ambulatory Visit: Payer: BLUE CROSS/BLUE SHIELD

## 2017-03-15 ENCOUNTER — Other Ambulatory Visit: Payer: Self-pay

## 2017-03-15 DIAGNOSIS — E038 Other specified hypothyroidism: Secondary | ICD-10-CM

## 2017-03-15 NOTE — Telephone Encounter (Signed)
Patient requesting refill of L-Thyroxine to [facility].

## 2017-03-18 ENCOUNTER — Encounter: Payer: Self-pay | Admitting: Psychiatry

## 2017-03-18 ENCOUNTER — Ambulatory Visit (INDEPENDENT_AMBULATORY_CARE_PROVIDER_SITE_OTHER): Payer: BLUE CROSS/BLUE SHIELD | Admitting: Psychiatry

## 2017-03-18 VITALS — BP 129/76 | HR 82 | Temp 98.6°F | Wt 249.8 lb

## 2017-03-18 DIAGNOSIS — F4312 Post-traumatic stress disorder, chronic: Secondary | ICD-10-CM | POA: Diagnosis not present

## 2017-03-18 DIAGNOSIS — F331 Major depressive disorder, recurrent, moderate: Secondary | ICD-10-CM | POA: Diagnosis not present

## 2017-03-18 MED ORDER — CLONAZEPAM 0.5 MG PO TABS: 0.5000 mg | ORAL_TABLET | Freq: Every day | ORAL | 2 refills | Status: DC

## 2017-03-18 MED ORDER — DESVENLAFAXINE SUCCINATE ER 50 MG PO TB24: 50.0000 mg | ORAL_TABLET | Freq: Every day | ORAL | 1 refills | Status: DC

## 2017-03-18 MED ORDER — ARIPIPRAZOLE 10 MG PO TABS: 10.0000 mg | ORAL_TABLET | Freq: Every day | ORAL | 1 refills | Status: DC

## 2017-03-18 MED ORDER — TOPIRAMATE 50 MG PO TABS: 50.0000 mg | ORAL_TABLET | Freq: Two times a day (BID) | ORAL | 1 refills | Status: DC

## 2017-03-18 NOTE — Progress Notes (Signed)
BH MD/PA/NP OP Progress Note  03/18/2017 12:48 PM Heather Jefferson  MRN:  [id number]  Subjective:    Patient is a 46 year old female who presented for the follow-up appointment. She reported that she has been doing well at this time. She reported that she was unable to go to [place] to see her father as she was unable to afford. However her father is doing well at this time. Patient reported that she has been compliant with her medications. She stopped taking the Topamax as she was not able to follow up with her neurologist. However she wants the medications to be refilled at this time. She stated that she feels that the current combination is helping her. She denied having any side effects of the medications. We discussed about her medications in detail. She stated that she is spending the summer with her kids who are at home during the summer break.    Chief Complaint:  Chief Complaint    Follow-up; Medication Refill     Visit Diagnosis:     ICD-10-CM   1. MDD (major depressive disorder), recurrent episode, moderate (HCC) F33.1   2. Chronic post-traumatic stress disorder (PTSD) F43.12     Past Medical History:  Past Medical History:  Diagnosis Date  . ADHD (attention deficit hyperactivity disorder)   . Allergy   . Anemia   . Anxiety   . Depression   . Fibromyalgia   . Generalized anxiety disorder   . Headache   . HIV infection ([place])   . Hypertension   . Major depressive disorder, recurrent episode, moderate ([place])   . Migraine with acute onset aura   . Persistent disorder of initiating or maintaining sleep   . PTSD (post-traumatic stress disorder)   . Restless leg   . Seizure disorder ([place])   . Thyroid disease   . Vitamin D deficiency     Past Surgical History:  Procedure Laterality Date  . EYE SURGERY    . TUBAL LIGATION     Family History:  Family History  Problem Relation Age of Onset  . Hypertension Mother   . Heart attack Mother   . CAD Mother   .  Diabetes Father   . Hypertension Father   . Hypertension Sister   . Anxiety disorder Brother   . Depression Brother    Social History:  Social History   Social History  . Marital status: Married    Spouse name: N/A  . Number of children: N/A  . Years of education: N/A   Social History Main Topics  . Smoking status: Never Smoker  . Smokeless tobacco: Never Used  . Alcohol use 0.0 oz/week     Comment: social drinker  . Drug use: No  . Sexual activity: Yes    Partners: Male    Birth control/ protection: Other-see comments     Comment: Tubal Ligation   Other Topics Concern  . None   Social History Narrative  . None   Additional History:  She currently lives with her husband and 2 children ages 24 and 54 years old. She has good relationship with them.  Assessment:   Musculoskeletal: Strength & Muscle Tone: within normal limits Gait & Station: normal Patient leans: N/A  Psychiatric Specialty Exam: Medication Refill   Anxiety       ROS  Blood pressure 129/76, pulse 82, temperature 98.6 F (37 C), temperature source Oral, weight 249 lb 12.8 oz (113.3 kg).Body mass index is 45.69 kg/m.  General  Appearance: Casual  Eye Contact:  Fair  Speech:  Clear and Coherent  Volume:  Normal  Mood:  Anxious and Depressed  Affect:  Congruent  Thought Process:  Logical  Orientation:  Full (Time, Place, and Person)  Thought Content:  WDL  Suicidal Thoughts:  No  Homicidal Thoughts:  No  Memory:  Immediate;   Fair  Judgement:  Fair  Insight:  Fair  Psychomotor Activity:  Decreased  Concentration:  Fair  Recall:  [employer] of Knowledge: Fair  Language: Fair  Akathisia:  No  Handed:  Right  AIMS (if indicated):  none  Assets:  Communication Skills Desire for Improvement Physical Health  ADL's:  Intact  Cognition: WNL  Sleep:  6-8   Is the patient at risk to self?  No. Has the patient been a risk to self in the past 6 months?  No. Has the patient been a risk to  self within the distant past?  No. Is the patient a risk to others?  No. Has the patient been a risk to others in the past 6 months?  No. Has the patient been a risk to others within the distant past?  No.  Current Medications: Current Outpatient Prescriptions  Medication Sig Dispense Refill  . ARIPiprazole (ABILIFY) 10 MG tablet Take 1 tablet (10 mg total) by mouth daily. 90 tablet 1  . butalbital-acetaminophen-caffeine (FIORICET WITH CODEINE) 50-325-40-30 MG capsule Take 1 capsule by mouth every 6 (six) hours as needed for headache. 30 capsule 1  . clonazePAM (KLONOPIN) 0.5 MG tablet Take 1 tablet (0.5 mg total) by mouth at bedtime. 30 tablet 2  . desvenlafaxine (PRISTIQ) 50 MG 24 hr tablet Take 1 tablet (50 mg total) by mouth daily. 90 tablet 1  . hydroxychloroquine (PLAQUENIL) 200 MG tablet Take by mouth.    . levothyroxine (SYNTHROID, LEVOTHROID) 25 MCG tablet Take 1 tablet (25 mcg total) by mouth daily. 30 tablet 0  . norgestimate-ethinyl estradiol (SPRINTEC 28) 0.25-35 MG-MCG tablet Take 1 tablet by mouth daily. 1 Package 11  . prednisoLONE acetate (PRED FORTE) 1 % ophthalmic suspension Apply to eye.    . spironolactone-hydrochlorothiazide (ALDACTAZIDE) 25-25 MG tablet TAKE 1 TABLET DAILY FOR BLOOD PRESSURE. STOP LOSARTAN. 90 tablet 1  . SUMAtriptan (IMITREX) 20 MG/ACT nasal spray Place into the nose.    . SUMAtriptan (IMITREX) 20 MG/ACT nasal spray as needed.     . topiramate (TOPAMAX) 50 MG tablet Take 1 tablet (50 mg total) by mouth 2 (two) times daily. 180 tablet 1   No current facility-administered medications for this visit.     Medical Decision Making:  Established Problem, Stable/Improving (1), Review of Psycho-Social Stressors (1) and Review of Medication Regimen & Side Effects (2)  Treatment Plan Summary:Medication management     Discussed with patient about her medications treatment risks benefits and alternatives.  She will continue on Pristiq 50 mg in the morning   Abilify 10 mg daily at bedtime. Continue Klonopin 0.5 mg by mouth daily at bedtime when necessary.  I will start her on Topamax 50 mg by mouth twice a day. She will follow-up in 3 month.   More than 50% of the time spent in psychoeducation, counseling and coordination of care.    This note was generated in part or whole with voice recognition software. Voice regonition is usually quite accurate but there are transcription errors that can and very often do occur. I apologize for any typographical errors that were not detected and corrected.  Rainey Pines, MD  03/18/2017, 12:48 PM

## 2017-03-25 ENCOUNTER — Inpatient Hospital Stay: Payer: BLUE CROSS/BLUE SHIELD | Attending: Internal Medicine

## 2017-03-25 DIAGNOSIS — M797 Fibromyalgia: Secondary | ICD-10-CM | POA: Diagnosis not present

## 2017-03-25 DIAGNOSIS — F419 Anxiety disorder, unspecified: Secondary | ICD-10-CM | POA: Diagnosis not present

## 2017-03-25 DIAGNOSIS — E079 Disorder of thyroid, unspecified: Secondary | ICD-10-CM | POA: Insufficient documentation

## 2017-03-25 DIAGNOSIS — N920 Excessive and frequent menstruation with regular cycle: Secondary | ICD-10-CM | POA: Diagnosis not present

## 2017-03-25 DIAGNOSIS — D649 Anemia, unspecified: Secondary | ICD-10-CM

## 2017-03-25 DIAGNOSIS — I10 Essential (primary) hypertension: Secondary | ICD-10-CM | POA: Insufficient documentation

## 2017-03-25 DIAGNOSIS — D500 Iron deficiency anemia secondary to blood loss (chronic): Secondary | ICD-10-CM | POA: Diagnosis not present

## 2017-03-25 DIAGNOSIS — G2581 Restless legs syndrome: Secondary | ICD-10-CM | POA: Insufficient documentation

## 2017-03-25 DIAGNOSIS — E559 Vitamin D deficiency, unspecified: Secondary | ICD-10-CM | POA: Insufficient documentation

## 2017-03-25 DIAGNOSIS — F909 Attention-deficit hyperactivity disorder, unspecified type: Secondary | ICD-10-CM | POA: Diagnosis not present

## 2017-03-25 DIAGNOSIS — Z79899 Other long term (current) drug therapy: Secondary | ICD-10-CM | POA: Insufficient documentation

## 2017-03-25 DIAGNOSIS — F4310 Post-traumatic stress disorder, unspecified: Secondary | ICD-10-CM | POA: Diagnosis not present

## 2017-03-25 LAB — CBC WITH DIFFERENTIAL/PLATELET
Basophils Absolute: 0.1 10*3/uL (ref 0–0.1)
Basophils Relative: 1 %
EOS PCT: 1 %
Eosinophils Absolute: 0.1 10*3/uL (ref 0–0.7)
HCT: 29.2 % — ABNORMAL LOW (ref 35.0–47.0)
Hemoglobin: 9.5 g/dL — ABNORMAL LOW (ref 12.0–16.0)
LYMPHS ABS: 2.4 10*3/uL (ref 1.0–3.6)
Lymphocytes Relative: 25 %
MCH: 22.2 pg — AB (ref 26.0–34.0)
MCHC: 32.4 g/dL (ref 32.0–36.0)
MCV: 68.5 fL — ABNORMAL LOW (ref 80.0–100.0)
MONOS PCT: 7 %
Monocytes Absolute: 0.6 10*3/uL (ref 0.2–0.9)
Neutro Abs: 6.6 10*3/uL — ABNORMAL HIGH (ref 1.4–6.5)
Neutrophils Relative %: 66 %
PLATELETS: 379 10*3/uL (ref 150–440)
RBC: 4.27 MIL/uL (ref 3.80–5.20)
RDW: 16.9 % — ABNORMAL HIGH (ref 11.5–14.5)
WBC: 9.8 10*3/uL (ref 3.6–11.0)

## 2017-03-25 LAB — FERRITIN: Ferritin: 6 ng/mL — ABNORMAL LOW (ref 11–307)

## 2017-03-25 LAB — IRON AND TIBC
IRON: 25 ug/dL — AB (ref 28–170)
Saturation Ratios: 5 % — ABNORMAL LOW (ref 10.4–31.8)
TIBC: 513 ug/dL — ABNORMAL HIGH (ref 250–450)
UIBC: 488 ug/dL

## 2017-03-26 ENCOUNTER — Inpatient Hospital Stay: Payer: BLUE CROSS/BLUE SHIELD

## 2017-03-26 ENCOUNTER — Inpatient Hospital Stay (HOSPITAL_BASED_OUTPATIENT_CLINIC_OR_DEPARTMENT_OTHER): Payer: BLUE CROSS/BLUE SHIELD | Admitting: Internal Medicine

## 2017-03-26 VITALS — BP 110/70 | HR 80 | Temp 98.1°F | Resp 20

## 2017-03-26 VITALS — BP 95/61 | HR 81 | Temp 98.8°F | Wt 245.7 lb

## 2017-03-26 DIAGNOSIS — N920 Excessive and frequent menstruation with regular cycle: Secondary | ICD-10-CM

## 2017-03-26 DIAGNOSIS — I10 Essential (primary) hypertension: Secondary | ICD-10-CM | POA: Diagnosis not present

## 2017-03-26 DIAGNOSIS — D649 Anemia, unspecified: Secondary | ICD-10-CM

## 2017-03-26 DIAGNOSIS — Z79899 Other long term (current) drug therapy: Secondary | ICD-10-CM | POA: Diagnosis not present

## 2017-03-26 DIAGNOSIS — D500 Iron deficiency anemia secondary to blood loss (chronic): Secondary | ICD-10-CM

## 2017-03-26 DIAGNOSIS — E079 Disorder of thyroid, unspecified: Secondary | ICD-10-CM | POA: Diagnosis not present

## 2017-03-26 DIAGNOSIS — E559 Vitamin D deficiency, unspecified: Secondary | ICD-10-CM | POA: Diagnosis not present

## 2017-03-26 DIAGNOSIS — G2581 Restless legs syndrome: Secondary | ICD-10-CM | POA: Diagnosis not present

## 2017-03-26 DIAGNOSIS — F419 Anxiety disorder, unspecified: Secondary | ICD-10-CM | POA: Diagnosis not present

## 2017-03-26 DIAGNOSIS — F909 Attention-deficit hyperactivity disorder, unspecified type: Secondary | ICD-10-CM | POA: Diagnosis not present

## 2017-03-26 DIAGNOSIS — F4310 Post-traumatic stress disorder, unspecified: Secondary | ICD-10-CM | POA: Diagnosis not present

## 2017-03-26 DIAGNOSIS — M797 Fibromyalgia: Secondary | ICD-10-CM | POA: Diagnosis not present

## 2017-03-26 MED ORDER — FERUMOXYTOL INJECTION 510 MG/17 ML
510.0000 mg | Freq: Once | INTRAVENOUS | Status: AC
  Administered 2017-03-26: 510 mg via INTRAVENOUS
  Filled 2017-03-26: qty 17

## 2017-03-26 MED ORDER — SODIUM CHLORIDE 0.9 % IV SOLN
Freq: Once | INTRAVENOUS | Status: AC
  Administered 2017-03-26: 14:00:00 via INTRAVENOUS
  Filled 2017-03-26: qty 1000

## 2017-03-26 NOTE — Progress Notes (Signed)
[facility] OFF[facility] PROGRESS NOTE  Patient Care Team: Steele Sizer, MD as PCP - General  Cancer Staging No matching staging information was found for the patient.    No history exists.   # CHRONIC IRON DEFICIENCY ANEMIA sec to menstrual blood loss [2014- Dr.Pandit]; NO EGD/colonoscopy  # Menorrhagia- on BCPs; ?? HIV    This is my first interaction with the patient as patient's primary oncologist has been [facility]. I reviewed the patient's prior charts/pertinent labs/imaging in detail; findings are summarized above.    INTERVAL HISTORY:  Heather Jefferson 46 y.o.  female pleasant patient above history of chronic iron deficiency anemia secondary to menstrual blood loss is here for follow-up  Of note patient noted to have worsening shortness of breath and fatigue with exertion over the last few months. Recent blood work showed hemoglobin 9.5 MCV of 68 and iron studies consistent with severe iron deficiency. Patient has been chewing on ice. She denies any blood in stools or black colored stools.  She unfortunately continues to have heavy menstrual periods; she is currently on birth control pills as per gynecology. By mouth iron makes her constipated abdominal discomfort.    REVIEW OF SYSTEMS:  A complete 10 point review of system is done which is negative except mentioned above/history of present illness.   PAST MEDICAL HISTORY :  Past Medical History:  Diagnosis Date  . ADHD (attention deficit hyperactivity disorder)   . Allergy   . Anemia   . Anxiety   . Depression   . Fibromyalgia   . Generalized anxiety disorder   . Headache   . HIV infection ([place])   . Hypertension   . Major depressive disorder, recurrent episode, moderate ([place])   . Migraine with acute onset aura   . Persistent disorder of initiating or maintaining sleep   . PTSD (post-traumatic stress disorder)   . Restless leg   . Seizure disorder ([place])   . Thyroid disease   . Vitamin D  deficiency     PAST SURGICAL HISTORY :   Past Surgical History:  Procedure Laterality Date  . EYE SURGERY    . TUBAL LIGATION      FAMILY HISTORY :   Family History  Problem Relation Age of Onset  . Hypertension Mother   . Heart attack Mother   . CAD Mother   . Diabetes Father   . Hypertension Father   . Hypertension Sister   . Anxiety disorder Brother   . Depression Brother     SOCIAL HISTORY:   Social History  Substance Use Topics  . Smoking status: Never Smoker  . Smokeless tobacco: Never Used  . Alcohol use 0.0 oz/week     Comment: social drinker    ALLERGIES:  is allergic to amoxicillin-pot clavulanate and losartan.  MEDICATIONS:  Current Outpatient Prescriptions  Medication Sig Dispense Refill  . ARIPiprazole (ABILIFY) 10 MG tablet Take 1 tablet (10 mg total) by mouth daily. 90 tablet 1  . butalbital-acetaminophen-caffeine (FIORICET WITH CODEINE) 50-325-40-30 MG capsule Take 1 capsule by mouth every 6 (six) hours as needed for headache. 30 capsule 1  . clonazePAM (KLONOPIN) 0.5 MG tablet Take 1 tablet (0.5 mg total) by mouth at bedtime. 30 tablet 2  . desvenlafaxine (PRISTIQ) 50 MG 24 hr tablet Take 1 tablet (50 mg total) by mouth daily. 90 tablet 1  . hydroxychloroquine (PLAQUENIL) 200 MG tablet Take by mouth.    . levothyroxine (SYNTHROID, LEVOTHROID) 25 MCG tablet Take 1 tablet (  25 mcg total) by mouth daily. 30 tablet 0  . norgestimate-ethinyl estradiol (SPRINTEC 28) 0.25-35 MG-MCG tablet Take 1 tablet by mouth daily. 1 Package 11  . prednisoLONE acetate (PRED FORTE) 1 % ophthalmic suspension Apply to eye.    . spironolactone-hydrochlorothiazide (ALDACTAZIDE) 25-25 MG tablet TAKE 1 TABLET DAILY FOR BLOOD PRESSURE. STOP LOSARTAN. 90 tablet 1  . SUMAtriptan (IMITREX) 20 MG/ACT nasal spray Place into the nose.    . topiramate (TOPAMAX) 50 MG tablet Take 1 tablet (50 mg total) by mouth 2 (two) times daily. 180 tablet 1   No current facility-administered  medications for this visit.     PHYSICAL EXAMINATION: ECOG PERFORMANCE STATUS: 0 - Asymptomatic  BP 95/61 (BP Location: Left Arm, Patient Position: Sitting)   Pulse 81   Temp 98.8 F (37.1 C) (Tympanic)   Wt 245 lb 11.2 oz (111.4 kg)   BMI 44.94 kg/m   Filed Weights   03/26/17 1335  Weight: 245 lb 11.2 oz (111.4 kg)    GENERAL: Well-nourished well-developed; Alert, no distress and comfortable.   Alone.  EYES: no pallor or icterus OROPHARYNX: no thrush or ulceration; good dentition  NECK: supple, no masses felt LYMPH:  no palpable lymphadenopathy in the cervical, axillary or inguinal regions LUNGS: clear to auscultation and  No wheeze or crackles HEART/CVS: regular rate & rhythm and no murmurs; No lower extremity edema ABDOMEN:abdomen soft, non-tender and normal bowel sounds Musculoskeletal:no cyanosis of digits and no clubbing  PSYCH: alert & oriented x 3 with fluent speech NEURO: no focal motor/sensory deficits SKIN:  no rashes or significant lesions  LABORATORY DATA:  I have reviewed the data as listed    Component Value Date/Time   NA 140 10/18/2016 0901   NA 138 03/11/2013 1337   K 3.7 10/18/2016 0901   K 3.8 03/11/2013 1337   CL 93 (L) 10/18/2016 0901   CL 100 03/11/2013 1337   CO2 33 (H) 10/18/2016 0901   CO2 27 03/11/2013 1337   GLUCOSE 117 (H) 10/18/2016 0901   GLUCOSE 114 (H) 03/11/2013 1337   BUN 10 10/18/2016 0901   BUN 14.3 03/11/2013 1337   CREATININE 0.70 10/18/2016 0901   CREATININE 0.8 03/11/2013 1337   CALCIUM 9.7 10/18/2016 0901   CALCIUM 9.4 03/11/2013 1337   PROT 7.5 10/18/2016 0901   PROT 7.7 03/11/2013 1337   ALBUMIN 4.1 10/18/2016 0901   ALBUMIN 3.3 (L) 03/11/2013 1337   AST 26 10/18/2016 0901   AST 12 03/11/2013 1337   ALT 24 10/18/2016 0901   ALT 12 03/11/2013 1337   ALKPHOS 68 10/18/2016 0901   ALKPHOS 117 03/11/2013 1337   BILITOT 0.5 10/18/2016 0901   BILITOT <0.20 Repeated and Verified 03/11/2013 1337   GFRNONAA >89  10/18/2016 0901   GFRAA >89 10/18/2016 0901    No results found for: SPEP, UPEP  Lab Results  Component Value Date   WBC 9.8 03/25/2017   NEUTROABS 6.6 (H) 03/25/2017   HGB 9.5 (L) 03/25/2017   HCT 29.2 (L) 03/25/2017   MCV 68.5 (L) 03/25/2017   PLT 379 03/25/2017      Chemistry      Component Value Date/Time   NA 140 10/18/2016 0901   NA 138 03/11/2013 1337   K 3.7 10/18/2016 0901   K 3.8 03/11/2013 1337   CL 93 (L) 10/18/2016 0901   CL 100 03/11/2013 1337   CO2 33 (H) 10/18/2016 0901   CO2 27 03/11/2013 1337   BUN 10 10/18/2016 0901  BUN 14.3 03/11/2013 1337   CREATININE 0.70 10/18/2016 0901   CREATININE 0.8 03/11/2013 1337      Component Value Date/Time   CALCIUM 9.7 10/18/2016 0901   CALCIUM 9.4 03/11/2013 1337   ALKPHOS 68 10/18/2016 0901   ALKPHOS 117 03/11/2013 1337   AST 26 10/18/2016 0901   AST 12 03/11/2013 1337   ALT 24 10/18/2016 0901   ALT 12 03/11/2013 1337   BILITOT 0.5 10/18/2016 0901   BILITOT <0.20 Repeated and Verified 03/11/2013 1337       RADIOGRAPHIC STUDIES: I have personally reviewed the radiological images as listed and agreed with the findings in the report. No results found.   ASSESSMENT & PLAN:  Iron deficiency anemia due to chronic blood loss # Iron deficiency anemia-symptomatic hemoglobin 9.5- secondary to heavy menstrual periods. Recommend IV iron/Feraheme today.   # Heavy menstrual periods/menorrhagia- recommend continued follow-up with gynecology.   # follow up in 2 months/labs prior/ferrahem.    Orders Placed This Encounter  Procedures  . CBC with Differential/Platelet    Standing Status:   Future    Standing Expiration Date:   03/26/2018  . Ferritin    Standing Status:   Future    Standing Expiration Date:   03/26/2018  . Iron and TIBC    Standing Status:   Future    Standing Expiration Date:   03/26/2018   All questions were answered. The patient knows to call the clinic with any problems, questions or concerns.       Cammie Sickle, MD 03/26/2017 5:38 PM

## 2017-03-26 NOTE — Patient Instructions (Signed)

## 2017-03-26 NOTE — Progress Notes (Signed)
Patient here today for follow up.  Patient c/o more fatigue than usual

## 2017-03-26 NOTE — Assessment & Plan Note (Addendum)
#   Iron deficiency anemia-symptomatic hemoglobin 9.5- secondary to heavy menstrual periods. Recommend IV iron/Feraheme today.   # Heavy menstrual periods/menorrhagia- recommend continued follow-up with gynecology.   # follow up in 2 months/labs prior/ferrahem.

## 2017-03-28 ENCOUNTER — Ambulatory Visit: Payer: BLUE CROSS/BLUE SHIELD

## 2017-03-28 ENCOUNTER — Ambulatory Visit: Payer: BLUE CROSS/BLUE SHIELD | Admitting: Internal Medicine

## 2017-04-15 ENCOUNTER — Ambulatory Visit (INDEPENDENT_AMBULATORY_CARE_PROVIDER_SITE_OTHER): Payer: BLUE CROSS/BLUE SHIELD | Admitting: Family Medicine

## 2017-04-15 ENCOUNTER — Encounter: Payer: Self-pay | Admitting: Family Medicine

## 2017-04-15 VITALS — BP 124/82 | HR 100 | Temp 98.1°F | Resp 16 | Ht 62.0 in | Wt 243.0 lb

## 2017-04-15 DIAGNOSIS — F339 Major depressive disorder, recurrent, unspecified: Secondary | ICD-10-CM | POA: Diagnosis not present

## 2017-04-15 DIAGNOSIS — I10 Essential (primary) hypertension: Secondary | ICD-10-CM

## 2017-04-15 DIAGNOSIS — E039 Hypothyroidism, unspecified: Secondary | ICD-10-CM | POA: Diagnosis not present

## 2017-04-15 DIAGNOSIS — M797 Fibromyalgia: Secondary | ICD-10-CM

## 2017-04-15 DIAGNOSIS — E668 Other obesity: Secondary | ICD-10-CM | POA: Diagnosis not present

## 2017-04-15 DIAGNOSIS — E8881 Metabolic syndrome: Secondary | ICD-10-CM | POA: Diagnosis not present

## 2017-04-15 DIAGNOSIS — N921 Excessive and frequent menstruation with irregular cycle: Secondary | ICD-10-CM | POA: Insufficient documentation

## 2017-04-15 DIAGNOSIS — F4310 Post-traumatic stress disorder, unspecified: Secondary | ICD-10-CM

## 2017-04-15 MED ORDER — SPIRONOLACTONE-HCTZ 25-25 MG PO TABS: ORAL_TABLET | ORAL | 1 refills | Status: DC

## 2017-04-15 MED ORDER — LEVOTHYROXINE SODIUM 25 MCG PO TABS: 25.0000 ug | ORAL_TABLET | Freq: Every day | ORAL | 1 refills | Status: DC

## 2017-04-15 NOTE — Patient Instructions (Addendum)
DASH Eating Plan DASH stands for "Dietary Approaches to Stop Hypertension." The DASH eating plan is a healthy eating plan that has been shown to reduce high blood pressure (hypertension). It may also reduce your risk for type 2 diabetes, heart disease, and stroke. The DASH eating plan may also help with weight loss. What are tips for following this plan? General guidelines  Avoid eating more than 2,300 mg (milligrams) of salt (sodium) a day. If you have hypertension, you may need to reduce your sodium intake to 1,500 mg a day.  Limit alcohol intake to no more than 1 drink a day for nonpregnant women and 2 drinks a day for men. One drink equals 12 oz of beer, 5 oz of wine, or 1 oz of hard liquor.  Work with your health care provider to maintain a healthy body weight or to lose weight. Ask what an ideal weight is for you.  Get at least 30 minutes of exercise that causes your heart to beat faster (aerobic exercise) most days of the week. Activities may include walking, swimming, or biking.  Work with your health care provider or diet and nutrition specialist (dietitian) to adjust your eating plan to your individual calorie needs. Reading food labels  Check food labels for the amount of sodium per serving. Choose foods with less than 5 percent of the Daily Value of sodium. Generally, foods with less than 300 mg of sodium per serving fit into this eating plan.  To find whole grains, look for the word "whole" as the first word in the ingredient list. Shopping  Buy products labeled as "low-sodium" or "no salt added."  Buy fresh foods. Avoid canned foods and premade or frozen meals. Cooking  Avoid adding salt when cooking. Use salt-free seasonings or herbs instead of table salt or sea salt. Check with your health care provider or pharmacist before using salt substitutes.  Do not fry foods. Cook foods using healthy methods such as baking, boiling, grilling, and broiling instead.  Cook with  heart-healthy oils, such as olive, canola, soybean, or sunflower oil. Meal planning   Eat a balanced diet that includes: ? 5 or more servings of fruits and vegetables each day. At each meal, try to fill half of your plate with fruits and vegetables. ? Up to 6-8 servings of whole grains each day. ? Less than 6 oz of lean meat, poultry, or fish each day. A 3-oz serving of meat is about the same size as a deck of cards. One egg equals 1 oz. ? 2 servings of low-fat dairy each day. ? A serving of nuts, seeds, or beans 5 times each week. ? Heart-healthy fats. Healthy fats called Omega-3 fatty acids are found in foods such as flaxseeds and coldwater fish, like sardines, salmon, and mackerel.  Limit how much you eat of the following: ? Canned or prepackaged foods. ? Food that is high in trans fat, such as fried foods. ? Food that is high in saturated fat, such as fatty meat. ? Sweets, desserts, sugary drinks, and other foods with added sugar. ? Full-fat dairy products.  Do not salt foods before eating.  Try to eat at least 2 vegetarian meals each week.  Eat more home-cooked food and less restaurant, buffet, and fast food.  When eating at a restaurant, ask that your food be prepared with less salt or no salt, if possible. What foods are recommended? The items listed may not be a complete list. Talk with your dietitian about what   dietary choices are best for you. Grains Whole-grain or whole-wheat bread. Whole-grain or whole-wheat pasta. Brown rice. Oatmeal. Quinoa. Bulgur. Whole-grain and low-sodium cereals. Pita bread. Low-fat, low-sodium crackers. Whole-wheat flour tortillas. Vegetables Fresh or frozen vegetables (raw, steamed, roasted, or grilled). Low-sodium or reduced-sodium tomato and vegetable juice. Low-sodium or reduced-sodium tomato sauce and tomato paste. Low-sodium or reduced-sodium canned vegetables. Fruits All fresh, dried, or frozen fruit. Canned fruit in natural juice (without  added sugar). Meat and other protein foods Skinless chicken or turkey. Ground chicken or turkey. Pork with fat trimmed off. Fish and seafood. Egg whites. Dried beans, peas, or lentils. Unsalted nuts, nut butters, and seeds. Unsalted canned beans. Lean cuts of beef with fat trimmed off. Low-sodium, lean deli meat. Dairy Low-fat (1%) or fat-free (skim) milk. Fat-free, low-fat, or reduced-fat cheeses. Nonfat, low-sodium ricotta or cottage cheese. Low-fat or nonfat yogurt. Low-fat, low-sodium cheese. Fats and oils Soft margarine without trans fats. Vegetable oil. Low-fat, reduced-fat, or light mayonnaise and salad dressings (reduced-sodium). Canola, safflower, olive, soybean, and sunflower oils. Avocado. Seasoning and other foods Herbs. Spices. Seasoning mixes without salt. Unsalted popcorn and pretzels. Fat-free sweets. What foods are not recommended? The items listed may not be a complete list. Talk with your dietitian about what dietary choices are best for you. Grains Baked goods made with fat, such as croissants, muffins, or some breads. Dry pasta or rice meal packs. Vegetables Creamed or fried vegetables. Vegetables in a cheese sauce. Regular canned vegetables (not low-sodium or reduced-sodium). Regular canned tomato sauce and paste (not low-sodium or reduced-sodium). Regular tomato and vegetable juice (not low-sodium or reduced-sodium). Pickles. Olives. Fruits Canned fruit in a light or heavy syrup. Fried fruit. Fruit in cream or butter sauce. Meat and other protein foods Fatty cuts of meat. Ribs. Fried meat. Bacon. Sausage. Bologna and other processed lunch meats. Salami. Fatback. Hotdogs. Bratwurst. Salted nuts and seeds. Canned beans with added salt. Canned or smoked fish. Whole eggs or egg yolks. Chicken or turkey with skin. Dairy Whole or 2% milk, cream, and half-and-half. Whole or full-fat cream cheese. Whole-fat or sweetened yogurt. Full-fat cheese. Nondairy creamers. Whipped toppings.  Processed cheese and cheese spreads. Fats and oils Butter. Stick margarine. Lard. Shortening. Ghee. Bacon fat. Tropical oils, such as coconut, palm kernel, or palm oil. Seasoning and other foods Salted popcorn and pretzels. Onion salt, garlic salt, seasoned salt, table salt, and sea salt. Worcestershire sauce. Tartar sauce. Barbecue sauce. Teriyaki sauce. Soy sauce, including reduced-sodium. Steak sauce. Canned and packaged gravies. Fish sauce. Oyster sauce. Cocktail sauce. Horseradish that you find on the shelf. Ketchup. Mustard. Meat flavorings and tenderizers. Bouillon cubes. Hot sauce and Tabasco sauce. Premade or packaged marinades. Premade or packaged taco seasonings. Relishes. Regular salad dressings. Where to find more information:  National Heart, Lung, and Blood Institute: www.nhlbi.nih.gov  American Heart Association: www.heart.org Summary  The DASH eating plan is a healthy eating plan that has been shown to reduce high blood pressure (hypertension). It may also reduce your risk for type 2 diabetes, heart disease, and stroke.  With the DASH eating plan, you should limit salt (sodium) intake to 2,300 mg a day. If you have hypertension, you may need to reduce your sodium intake to 1,500 mg a day.  When on the DASH eating plan, aim to eat more fresh fruits and vegetables, whole grains, lean proteins, low-fat dairy, and heart-healthy fats.  Work with your health care provider or diet and nutrition specialist (dietitian) to adjust your eating plan to your individual   calorie needs. This information is not intended to replace advice given to you by your health care provider. Make sure you discuss any questions you have with your health care provider. Document Released: 08/30/2011 Document Revised: 09/03/2016 Document Reviewed: 09/03/2016 Elsevier Interactive Patient Education  2017 [employer]  
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
## 2017-04-16 ENCOUNTER — Encounter: Payer: Self-pay | Admitting: Family Medicine

## 2017-04-18 ENCOUNTER — Other Ambulatory Visit: Payer: Self-pay | Admitting: Family Medicine

## 2017-04-18 ENCOUNTER — Telehealth: Payer: Self-pay | Admitting: Family Medicine

## 2017-04-18 ENCOUNTER — Encounter: Payer: Self-pay | Admitting: Family Medicine

## 2017-04-18 DIAGNOSIS — E038 Other specified hypothyroidism: Secondary | ICD-10-CM

## 2017-04-18 NOTE — Telephone Encounter (Signed)
Pt called stating she needs enough thyroid meds to last until she gets hers from [facility]. Her local pharmacy is [address]. Pt states she should get hers within 5 to 7 more days.

## 2017-04-19 ENCOUNTER — Telehealth: Payer: Self-pay | Admitting: Emergency Medicine

## 2017-04-19 DIAGNOSIS — E039 Hypothyroidism, unspecified: Secondary | ICD-10-CM

## 2017-04-19 MED ORDER — LEVOTHYROXINE SODIUM 25 MCG PO TABS: 25.0000 ug | ORAL_TABLET | Freq: Every day | ORAL | 0 refills | Status: DC

## 2017-04-19 NOTE — Telephone Encounter (Signed)
10-day supply provided to local pharmacy to get her through until Mail Order. She needs to come in to have her labs drawn so that we can make any needed adjustments to her dosing. Please notify.

## 2017-04-19 NOTE — Telephone Encounter (Signed)
Out of levothyroxine. It was sent to mail order. Need script to be sent to [facility] [place]

## 2017-04-19 NOTE — Telephone Encounter (Signed)
[facility] notified

## 2017-04-19 NOTE — Telephone Encounter (Signed)
Patient ntoified

## 2017-05-01 DIAGNOSIS — N920 Excessive and frequent menstruation with regular cycle: Secondary | ICD-10-CM | POA: Diagnosis not present

## 2017-05-21 ENCOUNTER — Inpatient Hospital Stay: Payer: BLUE CROSS/BLUE SHIELD | Attending: Internal Medicine

## 2017-05-21 DIAGNOSIS — G2581 Restless legs syndrome: Secondary | ICD-10-CM | POA: Diagnosis not present

## 2017-05-21 DIAGNOSIS — F4310 Post-traumatic stress disorder, unspecified: Secondary | ICD-10-CM | POA: Insufficient documentation

## 2017-05-21 DIAGNOSIS — N920 Excessive and frequent menstruation with regular cycle: Secondary | ICD-10-CM | POA: Insufficient documentation

## 2017-05-21 DIAGNOSIS — F909 Attention-deficit hyperactivity disorder, unspecified type: Secondary | ICD-10-CM | POA: Diagnosis not present

## 2017-05-21 DIAGNOSIS — E559 Vitamin D deficiency, unspecified: Secondary | ICD-10-CM | POA: Diagnosis not present

## 2017-05-21 DIAGNOSIS — Z79899 Other long term (current) drug therapy: Secondary | ICD-10-CM | POA: Diagnosis not present

## 2017-05-21 DIAGNOSIS — D649 Anemia, unspecified: Secondary | ICD-10-CM

## 2017-05-21 DIAGNOSIS — D500 Iron deficiency anemia secondary to blood loss (chronic): Secondary | ICD-10-CM | POA: Diagnosis not present

## 2017-05-21 DIAGNOSIS — I10 Essential (primary) hypertension: Secondary | ICD-10-CM | POA: Diagnosis not present

## 2017-05-21 LAB — CBC WITH DIFFERENTIAL/PLATELET
Basophils Absolute: 0.1 10*3/uL (ref 0–0.1)
Basophils Relative: 1 %
Eosinophils Absolute: 0.1 10*3/uL (ref 0–0.7)
Eosinophils Relative: 1 %
HEMATOCRIT: 34.5 % — AB (ref 35.0–47.0)
HEMOGLOBIN: 11.7 g/dL — AB (ref 12.0–16.0)
LYMPHS ABS: 2.6 10*3/uL (ref 1.0–3.6)
LYMPHS PCT: 27 %
MCH: 26.5 pg (ref 26.0–34.0)
MCHC: 33.8 g/dL (ref 32.0–36.0)
MCV: 78.3 fL — AB (ref 80.0–100.0)
MONO ABS: 0.6 10*3/uL (ref 0.2–0.9)
MONOS PCT: 6 %
NEUTROS ABS: 6.3 10*3/uL (ref 1.4–6.5)
NEUTROS PCT: 65 %
Platelets: 397 10*3/uL (ref 150–440)
RBC: 4.41 MIL/uL (ref 3.80–5.20)
RDW: 20.9 % — AB (ref 11.5–14.5)
WBC: 9.6 10*3/uL (ref 3.6–11.0)

## 2017-05-21 LAB — IRON AND TIBC
Iron: 51 ug/dL (ref 28–170)
Saturation Ratios: 11 % (ref 10.4–31.8)
TIBC: 448 ug/dL (ref 250–450)
UIBC: 397 ug/dL

## 2017-05-21 LAB — FERRITIN: Ferritin: 8 ng/mL — ABNORMAL LOW (ref 11–307)

## 2017-05-23 DIAGNOSIS — N83202 Unspecified ovarian cyst, left side: Secondary | ICD-10-CM | POA: Diagnosis not present

## 2017-05-23 DIAGNOSIS — N920 Excessive and frequent menstruation with regular cycle: Secondary | ICD-10-CM | POA: Diagnosis not present

## 2017-05-23 DIAGNOSIS — R102 Pelvic and perineal pain: Secondary | ICD-10-CM | POA: Diagnosis not present

## 2017-05-23 DIAGNOSIS — R143 Flatulence: Secondary | ICD-10-CM | POA: Diagnosis not present

## 2017-05-23 DIAGNOSIS — R938 Abnormal findings on diagnostic imaging of other specified body structures: Secondary | ICD-10-CM | POA: Diagnosis not present

## 2017-05-24 ENCOUNTER — Inpatient Hospital Stay: Payer: BLUE CROSS/BLUE SHIELD

## 2017-05-24 ENCOUNTER — Inpatient Hospital Stay (HOSPITAL_BASED_OUTPATIENT_CLINIC_OR_DEPARTMENT_OTHER): Payer: BLUE CROSS/BLUE SHIELD | Admitting: Internal Medicine

## 2017-05-24 VITALS — BP 126/80 | HR 76 | Temp 99.5°F | Resp 20 | Ht 62.0 in | Wt 244.0 lb

## 2017-05-24 DIAGNOSIS — D500 Iron deficiency anemia secondary to blood loss (chronic): Secondary | ICD-10-CM | POA: Diagnosis not present

## 2017-05-24 DIAGNOSIS — N920 Excessive and frequent menstruation with regular cycle: Secondary | ICD-10-CM | POA: Diagnosis not present

## 2017-05-24 DIAGNOSIS — I10 Essential (primary) hypertension: Secondary | ICD-10-CM | POA: Diagnosis not present

## 2017-05-24 DIAGNOSIS — F4310 Post-traumatic stress disorder, unspecified: Secondary | ICD-10-CM | POA: Diagnosis not present

## 2017-05-24 DIAGNOSIS — Z79899 Other long term (current) drug therapy: Secondary | ICD-10-CM | POA: Diagnosis not present

## 2017-05-24 DIAGNOSIS — F909 Attention-deficit hyperactivity disorder, unspecified type: Secondary | ICD-10-CM | POA: Diagnosis not present

## 2017-05-24 DIAGNOSIS — E559 Vitamin D deficiency, unspecified: Secondary | ICD-10-CM | POA: Diagnosis not present

## 2017-05-24 DIAGNOSIS — G2581 Restless legs syndrome: Secondary | ICD-10-CM | POA: Diagnosis not present

## 2017-05-24 NOTE — Assessment & Plan Note (Addendum)
#   Iron deficiency anemia; today hemoglobin 11.2- secondary to heavy menstrual periods. PO intol to iron. On intermittent IV Feraheme. Patient not asymptomatic. Hold Feraheme today.  # Heavy menstrual periods/menorrhagia- on bcps/Dr.Knowles-Jonas; [facility].  gynecology. Improved.  # follow up in 3 months/labs prior/possible ferrahem.

## 2017-05-24 NOTE — Progress Notes (Signed)
[facility] OFFICE PROGRESS NOTE  Patient Care Team: Steele Sizer, MD as PCP - General  Cancer Staging No matching staging information was found for the patient.    No history exists.   # CHRONIC IRON DEFICIENCY ANEMIA sec to menstrual blood loss [2014- Dr.Pandit]; NO EGD/colonoscopy  # Menorrhagia- on BCPs;   INTERVAL HISTORY:  Heather Jefferson 46 y.o.  female pleasant patient above history of chronic iron deficiency anemia secondary to menstrual blood loss is here for follow-up.  Patient started on birth control pills as per gynecology. Her menstrual periods are not as heavy at this time. She is not on by mouth iron because of poor tolerance/constipation.  Patient denies any significant shortness of breath at rest. She complains of mild shortness of breath on exertion. Denies any blood in stools or black stools.   REVIEW OF SYSTEMS:  A complete 10 point review of system is done which is negative except mentioned above/history of present illness.   PAST MEDICAL HISTORY :  Past Medical History:  Diagnosis Date  . ADHD (attention deficit hyperactivity disorder)   . Allergy   . Anemia   . Anxiety   . Depression   . Fibromyalgia   . Generalized anxiety disorder   . Headache   . HIV infection ([place])   . Hypertension   . Major depressive disorder, recurrent episode, moderate ([place])   . Migraine with acute onset aura   . Persistent disorder of initiating or maintaining sleep   . PTSD (post-traumatic stress disorder)   . Restless leg   . Seizure disorder ([place])   . Thyroid disease   . Vitamin D deficiency     PAST SURGICAL HISTORY :   Past Surgical History:  Procedure Laterality Date  . EYE SURGERY    . TUBAL LIGATION      FAMILY HISTORY :   Family History  Problem Relation Age of Onset  . Hypertension Mother   . Heart attack Mother   . CAD Mother   . Diabetes Father   . Hypertension Father   . Hypertension Sister   . Anxiety disorder  Brother   . Depression Brother     SOCIAL HISTORY:   Social History  Substance Use Topics  . Smoking status: Never Smoker  . Smokeless tobacco: Never Used  . Alcohol use 0.0 oz/week     Comment: social drinker    ALLERGIES:  is allergic to amoxicillin-pot clavulanate and losartan.  MEDICATIONS:  Current Outpatient Prescriptions  Medication Sig Dispense Refill  . ARIPiprazole (ABILIFY) 10 MG tablet Take 1 tablet (10 mg total) by mouth daily. 90 tablet 1  . clonazePAM (KLONOPIN) 0.5 MG tablet Take 1 tablet (0.5 mg total) by mouth at bedtime. 30 tablet 2  . desvenlafaxine (PRISTIQ) 50 MG 24 hr tablet Take 1 tablet (50 mg total) by mouth daily. 90 tablet 1  . hydroxychloroquine (PLAQUENIL) 200 MG tablet Take 200 mg by mouth 2 (two) times daily.     Marland Kitchen levothyroxine (SYNTHROID, LEVOTHROID) 25 MCG tablet Take 1 tablet (25 mcg total) by mouth daily. 10 tablet 0  . norgestimate-ethinyl estradiol (SPRINTEC 28) 0.25-35 MG-MCG tablet Take 1 tablet by mouth daily. 1 Package 11  . prednisoLONE acetate (PRED FORTE) 1 % ophthalmic suspension Place 1 drop into the right eye daily.     Marland Kitchen spironolactone-hydrochlorothiazide (ALDACTAZIDE) 25-25 MG tablet TAKE 1 TABLET DAILY FOR BLOOD PRESSURE. STOP LOSARTAN. 90 tablet 1  . topiramate (TOPAMAX) 50 MG tablet Take  1 tablet (50 mg total) by mouth 2 (two) times daily. 180 tablet 1  . butalbital-acetaminophen-caffeine (FIORICET WITH CODEINE) 50-325-40-30 MG capsule Take 1 capsule by mouth every 6 (six) hours as needed for headache. (Patient not taking: Reported on 04/15/2017) 30 capsule 1  . SUMAtriptan (IMITREX) 20 MG/ACT nasal spray Place 20 mg into the nose every 2 (two) hours as needed for migraine.      No current facility-administered medications for this visit.     PHYSICAL EXAMINATION: ECOG PERFORMANCE STATUS: 0 - Asymptomatic  BP 126/80 (BP Location: Left Arm, Patient Position: Sitting)   Pulse 76   Temp 99.5 F (37.5 C) (Tympanic)   Resp 20    Ht 5\' 2"  (1.575 m)   Wt 244 lb (110.7 kg)   BMI 44.63 kg/m   Filed Weights   05/24/17 1348  Weight: 244 lb (110.7 kg)    GENERAL: Well-nourished well-developed; Alert, no distress and comfortable.   Alone.  EYES: no pallor or icterus OROPHARYNX: no thrush or ulceration; good dentition  NECK: supple, no masses felt LYMPH:  no palpable lymphadenopathy in the cervical, axillary or inguinal regions LUNGS: clear to auscultation and  No wheeze or crackles HEART/CVS: regular rate & rhythm and no murmurs; No lower extremity edema ABDOMEN:abdomen soft, non-tender and normal bowel sounds Musculoskeletal:no cyanosis of digits and no clubbing  PSYCH: alert & oriented x 3 with fluent speech NEURO: no focal motor/sensory deficits SKIN:  no rashes or significant lesions  LABORATORY DATA:  I have reviewed the data as listed    Component Value Date/Time   NA 140 10/18/2016 0901   NA 138 03/11/2013 1337   K 3.7 10/18/2016 0901   K 3.8 03/11/2013 1337   CL 93 (L) 10/18/2016 0901   CL 100 03/11/2013 1337   CO2 33 (H) 10/18/2016 0901   CO2 27 03/11/2013 1337   GLUCOSE 117 (H) 10/18/2016 0901   GLUCOSE 114 (H) 03/11/2013 1337   BUN 10 10/18/2016 0901   BUN 14.3 03/11/2013 1337   CREATININE 0.70 10/18/2016 0901   CREATININE 0.8 03/11/2013 1337   CALCIUM 9.7 10/18/2016 0901   CALCIUM 9.4 03/11/2013 1337   PROT 7.5 10/18/2016 0901   PROT 7.7 03/11/2013 1337   ALBUMIN 4.1 10/18/2016 0901   ALBUMIN 3.3 (L) 03/11/2013 1337   AST 26 10/18/2016 0901   AST 12 03/11/2013 1337   ALT 24 10/18/2016 0901   ALT 12 03/11/2013 1337   ALKPHOS 68 10/18/2016 0901   ALKPHOS 117 03/11/2013 1337   BILITOT 0.5 10/18/2016 0901   BILITOT <0.20 Repeated and Verified 03/11/2013 1337   GFRNONAA >89 10/18/2016 0901   GFRAA >89 10/18/2016 0901    No results found for: SPEP, UPEP  Lab Results  Component Value Date   WBC 9.6 05/21/2017   NEUTROABS 6.3 05/21/2017   HGB 11.7 (L) 05/21/2017   HCT 34.5 (L)  05/21/2017   MCV 78.3 (L) 05/21/2017   PLT 397 05/21/2017      Chemistry      Component Value Date/Time   NA 140 10/18/2016 0901   NA 138 03/11/2013 1337   K 3.7 10/18/2016 0901   K 3.8 03/11/2013 1337   CL 93 (L) 10/18/2016 0901   CL 100 03/11/2013 1337   CO2 33 (H) 10/18/2016 0901   CO2 27 03/11/2013 1337   BUN 10 10/18/2016 0901   BUN 14.3 03/11/2013 1337   CREATININE 0.70 10/18/2016 0901   CREATININE 0.8 03/11/2013 1337  Component Value Date/Time   CALCIUM 9.7 10/18/2016 0901   CALCIUM 9.4 03/11/2013 1337   ALKPHOS 68 10/18/2016 0901   ALKPHOS 117 03/11/2013 1337   AST 26 10/18/2016 0901   AST 12 03/11/2013 1337   ALT 24 10/18/2016 0901   ALT 12 03/11/2013 1337   BILITOT 0.5 10/18/2016 0901   BILITOT <0.20 Repeated and Verified 03/11/2013 1337       RADIOGRAPHIC STUDIES: I have personally reviewed the radiological images as listed and agreed with the findings in the report. No results found.   ASSESSMENT & PLAN:  Iron deficiency anemia due to chronic blood loss # Iron deficiency anemia; today hemoglobin 11.2- secondary to heavy menstrual periods. PO intol to iron. On intermittent IV Feraheme. Patient not asymptomatic. Hold Feraheme today.  # Heavy menstrual periods/menorrhagia- on bcps/Dr.Knowles-Jonas; [facility].  gynecology. Improved.  # follow up in 3 months/labs prior/possible ferrahem.    No orders of the defined types were placed in this encounter.  All questions were answered. The patient knows to call the clinic with any problems, questions or concerns.      Cammie Sickle, MD 05/26/2017 6:34 AM

## 2017-05-29 DIAGNOSIS — I10 Essential (primary) hypertension: Secondary | ICD-10-CM | POA: Diagnosis not present

## 2017-05-29 DIAGNOSIS — E039 Hypothyroidism, unspecified: Secondary | ICD-10-CM | POA: Diagnosis not present

## 2017-05-29 DIAGNOSIS — E668 Other obesity: Secondary | ICD-10-CM | POA: Diagnosis not present

## 2017-05-29 DIAGNOSIS — E8881 Metabolic syndrome: Secondary | ICD-10-CM | POA: Diagnosis not present

## 2017-05-30 LAB — HEMOGLOBIN A1C
EAG (MMOL/L): 6.3 (calc)
Hgb A1c MFr Bld: 5.6 % of total Hgb (ref <5.7)
Mean Plasma Glucose: 114 (calc)

## 2017-05-30 LAB — COMPLETE METABOLIC PANEL WITH GFR
AG RATIO: 1.2 (calc) (ref 1.0–2.5)
ALKALINE PHOSPHATASE (APISO): 61 U/L (ref 33–115)
ALT: 10 U/L (ref 6–29)
AST: 12 U/L (ref 10–35)
Albumin: 3.8 g/dL (ref 3.6–5.1)
BUN: 9 mg/dL (ref 7–25)
CALCIUM: 9.3 mg/dL (ref 8.6–10.2)
CHLORIDE: 103 mmol/L (ref 98–110)
CO2: 23 mmol/L (ref 20–32)
Creat: 0.74 mg/dL (ref 0.50–1.10)
GFR, Est African American: 112 mL/min/{1.73_m2} (ref >=60)
GFR, Est Non African American: 96 mL/min/{1.73_m2} (ref >=60)
GLOBULIN: 3.2 g/dL (ref 1.9–3.7)
Glucose, Bld: 100 mg/dL — ABNORMAL HIGH (ref 65–99)
POTASSIUM: 3.9 mmol/L (ref 3.5–5.3)
SODIUM: 138 mmol/L (ref 135–146)
Total Bilirubin: 0.3 mg/dL (ref 0.2–1.2)
Total Protein: 7 g/dL (ref 6.1–8.1)

## 2017-05-30 LAB — INSULIN, FASTING: Insulin: 18.6 u[IU]/mL (ref 2.0–19.6)

## 2017-05-30 LAB — TSH: TSH: 5.16 mIU/L — ABNORMAL HIGH

## 2017-06-04 ENCOUNTER — Encounter: Payer: Self-pay | Admitting: Family Medicine

## 2017-06-05 DIAGNOSIS — Z30430 Encounter for insertion of intrauterine contraceptive device: Secondary | ICD-10-CM | POA: Diagnosis not present

## 2017-06-05 DIAGNOSIS — N920 Excessive and frequent menstruation with regular cycle: Secondary | ICD-10-CM | POA: Diagnosis not present

## 2017-06-11 ENCOUNTER — Encounter: Payer: Self-pay | Admitting: Family Medicine

## 2017-06-17 ENCOUNTER — Encounter: Payer: Self-pay | Admitting: Psychiatry

## 2017-06-17 ENCOUNTER — Telehealth: Payer: Self-pay

## 2017-06-17 ENCOUNTER — Ambulatory Visit (INDEPENDENT_AMBULATORY_CARE_PROVIDER_SITE_OTHER): Payer: BLUE CROSS/BLUE SHIELD | Admitting: Psychiatry

## 2017-06-17 VITALS — BP 122/79 | HR 77 | Temp 98.6°F | Wt 245.2 lb

## 2017-06-17 DIAGNOSIS — F331 Major depressive disorder, recurrent, moderate: Secondary | ICD-10-CM | POA: Diagnosis not present

## 2017-06-17 DIAGNOSIS — F4312 Post-traumatic stress disorder, chronic: Secondary | ICD-10-CM | POA: Diagnosis not present

## 2017-06-17 MED ORDER — CLONAZEPAM 0.5 MG PO TABS: 0.5000 mg | ORAL_TABLET | Freq: Every day | ORAL | 2 refills | Status: DC

## 2017-06-17 MED ORDER — ARIPIPRAZOLE 10 MG PO TABS: 10.0000 mg | ORAL_TABLET | Freq: Every day | ORAL | 1 refills | Status: DC

## 2017-06-17 MED ORDER — TOPIRAMATE 50 MG PO TABS: 50.0000 mg | ORAL_TABLET | Freq: Two times a day (BID) | ORAL | 1 refills | Status: DC

## 2017-06-17 MED ORDER — DESVENLAFAXINE SUCCINATE ER 100 MG PO TB24: 100.0000 mg | ORAL_TABLET | Freq: Every day | ORAL | 2 refills | Status: DC

## 2017-06-17 NOTE — Telephone Encounter (Signed)
faxed and confirmed rx fro klonopin .5mg  id # [id number] order # [id number]

## 2017-06-17 NOTE — Progress Notes (Signed)
BH MD/PA/NP OP Progress Note  06/17/2017 9:53 AM Heather Jefferson  MRN:  [id number]  Subjective:    Patient is a 46 year old female who presented for the follow-up appointment. She reported that she has been doing well at this time. She reported that she has some anxiety and she will keep on moving her legs when she is becoming more apprehensive. She reported that she has been compliant with her medications. We discussed about increasing the dose of her Pristiq and she is receptive to the medication adjustment. She denied having any suicidal homicidal ideations or plans. She appeared calm and alert during the interview. She reported that she has been sleeping well at night. No acute issues noted at this time.       Chief Complaint:  Chief Complaint    Follow-up; Medication Refill     Visit Diagnosis:     ICD-10-CM   1. MDD (major depressive disorder), recurrent episode, moderate (HCC) F33.1   2. Chronic post-traumatic stress disorder (PTSD) F43.12     Past Medical History:  Past Medical History:  Diagnosis Date  . ADHD (attention deficit hyperactivity disorder)   . Allergy   . Anemia   . Anxiety   . Depression   . Fibromyalgia   . Generalized anxiety disorder   . Headache   . HIV infection ([place])   . Hypertension   . Major depressive disorder, recurrent episode, moderate ([place])   . Migraine with acute onset aura   . Persistent disorder of initiating or maintaining sleep   . PTSD (post-traumatic stress disorder)   . Restless leg   . Seizure disorder ([place])   . Thyroid disease   . Vitamin D deficiency     Past Surgical History:  Procedure Laterality Date  . EYE SURGERY    . TUBAL LIGATION     Family History:  Family History  Problem Relation Age of Onset  . Hypertension Mother   . Heart attack Mother   . CAD Mother   . Diabetes Father   . Hypertension Father   . Hypertension Sister   . Anxiety disorder Brother   . Depression Brother    Social History:   Social History   Social History  . Marital status: Married    Spouse name: N/A  . Number of children: N/A  . Years of education: N/A   Social History Main Topics  . Smoking status: Never Smoker  . Smokeless tobacco: Never Used  . Alcohol use 0.0 oz/week     Comment: social drinker  . Drug use: No  . Sexual activity: Yes    Partners: Male    Birth control/ protection: Other-see comments     Comment: Tubal Ligation   Other Topics Concern  . 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 She has good relationship with them.  Assessment:   Musculoskeletal: Strength & Muscle Tone: within normal limits Gait & Station: normal Patient leans: N/A  Psychiatric Specialty Exam: Medication Refill   Anxiety       ROS  Blood pressure 122/79, pulse 77, temperature 98.6 F (37 C), temperature source Oral, weight 245 lb 3.2 oz (111.2 kg).Body mass index is 44.85 kg/m.  General Appearance: Casual  Eye Contact:  Fair  Speech:  Clear  and Coherent  Volume:  Normal  Mood:  Anxious and Depressed  Affect:  Congruent  Thought Process:  Logical  Orientation:  Full (Time, Place, and Person)  Thought Content:  WDL  Suicidal Thoughts:  No  Homicidal Thoughts:  No  Memory:  Immediate;   Fair  Judgement:  Fair  Insight:  Fair  Psychomotor Activity:  Decreased  Concentration:  Fair  Recall:  [employer] of Knowledge: Fair  Language: Fair  Akathisia:  No  Handed:  Right  AIMS (if indicated):  none  Assets:  Communication Skills Desire for Improvement Physical Health  ADL's:  Intact  Cognition: WNL  Sleep:  6-8   Is the patient at risk to self?  No. Has the patient been a risk to self in the past 6 months?  No. Has the patient been a risk to self within the distant past?  No. Is  the patient a risk to others?  No. Has the patient been a risk to others in the past 6 months?  No. Has the patient been a risk to others within the distant past?  No.  Current Medications: Current Outpatient Prescriptions  Medication Sig Dispense Refill  . ARIPiprazole (ABILIFY) 10 MG tablet Take 1 tablet (10 mg total) by mouth daily. 90 tablet 1  . butalbital-acetaminophen-caffeine (FIORICET WITH CODEINE) 50-325-40-30 MG capsule Take 1 capsule by mouth every 6 (six) hours as needed for headache. 30 capsule 1  . clonazePAM (KLONOPIN) 0.5 MG tablet Take 1 tablet (0.5 mg total) by mouth at bedtime. 30 tablet 2  . desvenlafaxine (PRISTIQ) 100 MG 24 hr tablet Take 1 tablet (100 mg total) by mouth daily. 90 tablet 2  . hydroxychloroquine (PLAQUENIL) 200 MG tablet Take 200 mg by mouth 2 (two) times daily.     Marland Kitchen levothyroxine (SYNTHROID, LEVOTHROID) 25 MCG tablet Take 1 tablet (25 mcg total) by mouth daily. 10 tablet 0  . MIRENA, 52 MG, 20 MCG/24HR IUD     . norgestimate-ethinyl estradiol (SPRINTEC 28) 0.25-35 MG-MCG tablet Take 1 tablet by mouth daily. 1 Package 11  . prednisoLONE acetate (PRED FORTE) 1 % ophthalmic suspension Place 1 drop into the right eye daily.     Marland Kitchen spironolactone-hydrochlorothiazide (ALDACTAZIDE) 25-25 MG tablet TAKE 1 TABLET DAILY FOR BLOOD PRESSURE. STOP LOSARTAN. 90 tablet 1  . SUMAtriptan (IMITREX) 20 MG/ACT nasal spray Place 20 mg into the nose every 2 (two) hours as needed for migraine.     . topiramate (TOPAMAX) 50 MG tablet Take 1 tablet (50 mg total) by mouth 2 (two) times daily. 180 tablet 1  . tranexamic acid (LYSTEDA) 650 MG TABS tablet      No current facility-administered medications for this visit.     Medical Decision Making:  Established Problem, Stable/Improving (1), Review of Psycho-Social Stressors (1) and Review of Medication Regimen & Side Effects (2)  Treatment Plan Summary:Medication management     Discussed with patient about her medications  treatment risks benefits and alternatives.  I will increase the dose of Pristiq 100 mg in the morning. Abilify 10 mg daily at bedtime. Continue Klonopin 0.5 mg by mouth daily at bedtime when necessary.   Topamax 50 mg by mouth twice a day. She will follow-up in 3 month.   More than 50% of the time spent in psychoeducation, counseling and coordination of care.    This note was generated in part or whole with voice recognition software. Voice regonition is usually quite accurate but there are transcription  errors that can and very often do occur. I apologize for any typographical errors that were not detected and corrected.   Rainey Pines, MD  06/17/2017, 9:53 AM

## 2017-06-20 ENCOUNTER — Encounter: Payer: Self-pay | Admitting: Family Medicine

## 2017-06-20 ENCOUNTER — Other Ambulatory Visit: Payer: Self-pay

## 2017-06-20 DIAGNOSIS — E039 Hypothyroidism, unspecified: Secondary | ICD-10-CM

## 2017-06-20 NOTE — Telephone Encounter (Signed)
I need to know how much she is taking, please check with patient, if taking 25 mcg daily  We will increase to taking 2 pills twice weekly and rechecking in 6 weeks

## 2017-06-20 NOTE — Telephone Encounter (Signed)
Pharmacy is requesting refill of Levothyroxine for your patient.   Last rx filled: 04/19/2017. Last office visit: 04/15/2017 Lab Results  Component Value Date   TSH 5.16 (H) 05/29/2017

## 2017-06-21 ENCOUNTER — Other Ambulatory Visit: Payer: Self-pay

## 2017-06-21 DIAGNOSIS — E039 Hypothyroidism, unspecified: Secondary | ICD-10-CM

## 2017-06-21 MED ORDER — LEVOTHYROXINE SODIUM 25 MCG PO TABS: 25.0000 ug | ORAL_TABLET | Freq: Every day | ORAL | 0 refills | Status: DC

## 2017-06-21 NOTE — Telephone Encounter (Addendum)
TSH ordered with expected date 08/02/2017. 90 day supply provided with instructions changed to take 2 tablets twice weekly, otherwise take 1 tablet daily. Synthroid sent to [facility].

## 2017-06-21 NOTE — Telephone Encounter (Signed)
Patient is only taking one pill a day. Informed patient to take 2 pills twice weekly. Then come back in 6 weeks to recheck. Patient only has 5 more Levothyroxine pills left, please send to local pharmacy. Also order TSH to be future ordered for 6 weeks. Thanks.
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
## 2017-07-05 ENCOUNTER — Other Ambulatory Visit: Payer: Self-pay

## 2017-07-09 DIAGNOSIS — M059 Rheumatoid arthritis with rheumatoid factor, unspecified: Secondary | ICD-10-CM | POA: Diagnosis not present

## 2017-07-09 DIAGNOSIS — M797 Fibromyalgia: Secondary | ICD-10-CM | POA: Diagnosis not present

## 2017-07-09 DIAGNOSIS — I10 Essential (primary) hypertension: Secondary | ICD-10-CM | POA: Diagnosis not present

## 2017-07-09 DIAGNOSIS — Z79899 Other long term (current) drug therapy: Secondary | ICD-10-CM | POA: Diagnosis not present

## 2017-07-16 ENCOUNTER — Ambulatory Visit: Payer: Self-pay

## 2017-07-16 ENCOUNTER — Telehealth: Payer: Self-pay | Admitting: *Deleted

## 2017-07-16 NOTE — Telephone Encounter (Signed)
[facility]; spoke with [facility].  Due to unavailable appt. in office today, advised to send the patient to Urgent Care.    Call returned to the pt.  Advised that MD office is advising she go to Urgent Care for evaluation and treatment.  Strongly advised pt. to have someone drive her to Urgent Care.  Pt. Verb. Understanding.  Reason for Disposition . [1] SEVERE headache AND [2] sudden-onset (i.e., reaching maximum intensity within seconds)  Answer Assessment - Initial Assessment Questions 1. LOCATION: "Where does it hurt?"      From left eye to left ear 2. ONSET: "When did the headache start?" (Minutes, hours or days)      Yesterday morning  3. PATTERN: "Does the pain come and go, or has it been constant since it started?"     H/A constant; "throbbing" 4. SEVERITY: "How bad is the pain?" and "What does it keep you from doing?"  (e.g., Scale 1-10; mild, moderate, or severe)   - MILD (1-3): doesn't interfere with normal activities    - MODERATE (4-7): interferes with normal activities or awakens from sleep    - SEVERE (8-10): excruciating pain, unable to do any normal activities        Severe @ 7/10 5. RECURRENT SYMPTOM: "Have you ever had headaches before?" If so, ask: "When was the last time?" and "What happened that time?"      Yes, I have migranes; its been awhile; maybe 2 mos. 6. CAUSE: "What do you think is causing the headache?"     Migrane; unsure what brought this on. 7. MIGRAINE: "Have you been diagnosed with migraine headaches?" If so, ask: "Is this headache similar?"      yes 8. HEAD INJURY: "Has there been any recent injury to the head?"      no 9. OTHER SYMPTOMS: "Do you have any other symptoms?" (fever, stiff neck, eye pain, sore throat, cold symptoms)     Some nausea; I had an aura causing vision changes in both eyes. Described as kind of blurry at this time.   10. PREGNANCY: "Is there any chance you are pregnant?" "When was your last menstrual  period?"       no  Protocols used: HEADACHE-A-AH

## 2017-07-16 NOTE — Telephone Encounter (Signed)
See Triage note and disposition.

## 2017-07-19 ENCOUNTER — Telehealth: Payer: Self-pay

## 2017-07-19 NOTE — Telephone Encounter (Signed)
Copied from [place] [id number]. Topic: Quick Communication - See Telephone Encounter >> Jul 16, 2017 11:32 AM Aurelio Brash B wrote: CRM for notification. See Telephone encounter for: 07/16/17. Pt asking for refill on fiorcet with codine

## 2017-07-19 NOTE — Telephone Encounter (Signed)
Spoke with patient and informed her Dr. Gretel Acre prescribes this medication. So she would have to call her office to request refills.

## 2017-08-23 ENCOUNTER — Inpatient Hospital Stay: Payer: BLUE CROSS/BLUE SHIELD | Attending: Internal Medicine | Admitting: *Deleted

## 2017-08-23 DIAGNOSIS — N920 Excessive and frequent menstruation with regular cycle: Secondary | ICD-10-CM | POA: Diagnosis not present

## 2017-08-23 DIAGNOSIS — D509 Iron deficiency anemia, unspecified: Secondary | ICD-10-CM

## 2017-08-23 DIAGNOSIS — F909 Attention-deficit hyperactivity disorder, unspecified type: Secondary | ICD-10-CM | POA: Diagnosis not present

## 2017-08-23 DIAGNOSIS — E559 Vitamin D deficiency, unspecified: Secondary | ICD-10-CM | POA: Diagnosis not present

## 2017-08-23 DIAGNOSIS — F4310 Post-traumatic stress disorder, unspecified: Secondary | ICD-10-CM | POA: Insufficient documentation

## 2017-08-23 DIAGNOSIS — D500 Iron deficiency anemia secondary to blood loss (chronic): Secondary | ICD-10-CM | POA: Insufficient documentation

## 2017-08-23 DIAGNOSIS — G2581 Restless legs syndrome: Secondary | ICD-10-CM | POA: Insufficient documentation

## 2017-08-23 DIAGNOSIS — Z79899 Other long term (current) drug therapy: Secondary | ICD-10-CM | POA: Diagnosis not present

## 2017-08-23 DIAGNOSIS — I10 Essential (primary) hypertension: Secondary | ICD-10-CM | POA: Insufficient documentation

## 2017-08-23 LAB — IRON AND TIBC
IRON: 37 ug/dL (ref 28–170)
SATURATION RATIOS: 9 % — AB (ref 10.4–31.8)
TIBC: 424 ug/dL (ref 250–450)
UIBC: 387 ug/dL

## 2017-08-23 LAB — CBC WITH DIFFERENTIAL/PLATELET
BASOS PCT: 1 %
Basophils Absolute: 0.1 10*3/uL (ref 0–0.1)
EOS PCT: 1 %
Eosinophils Absolute: 0.1 10*3/uL (ref 0–0.7)
HEMATOCRIT: 39.1 % (ref 35.0–47.0)
Hemoglobin: 12.9 g/dL (ref 12.0–16.0)
Lymphocytes Relative: 29 %
Lymphs Abs: 2.2 10*3/uL (ref 1.0–3.6)
MCH: 27.4 pg (ref 26.0–34.0)
MCHC: 33 g/dL (ref 32.0–36.0)
MCV: 83.1 fL (ref 80.0–100.0)
MONO ABS: 0.6 10*3/uL (ref 0.2–0.9)
MONOS PCT: 8 %
NEUTROS ABS: 4.7 10*3/uL (ref 1.4–6.5)
Neutrophils Relative %: 61 %
PLATELETS: 374 10*3/uL (ref 150–440)
RBC: 4.7 MIL/uL (ref 3.80–5.20)
RDW: 15.4 % — AB (ref 11.5–14.5)
WBC: 7.6 10*3/uL (ref 3.6–11.0)

## 2017-08-23 LAB — FERRITIN: FERRITIN: 8 ng/mL — AB (ref 11–307)

## 2017-08-26 ENCOUNTER — Inpatient Hospital Stay: Payer: BLUE CROSS/BLUE SHIELD | Attending: Internal Medicine | Admitting: Internal Medicine

## 2017-08-26 ENCOUNTER — Inpatient Hospital Stay: Payer: BLUE CROSS/BLUE SHIELD

## 2017-08-26 ENCOUNTER — Other Ambulatory Visit: Payer: Self-pay

## 2017-08-26 VITALS — BP 112/80 | HR 77 | Resp 18

## 2017-08-26 VITALS — BP 125/72 | HR 82 | Temp 97.8°F | Resp 20 | Ht 62.0 in | Wt 246.0 lb

## 2017-08-26 DIAGNOSIS — K5900 Constipation, unspecified: Secondary | ICD-10-CM | POA: Diagnosis not present

## 2017-08-26 DIAGNOSIS — D500 Iron deficiency anemia secondary to blood loss (chronic): Secondary | ICD-10-CM

## 2017-08-26 DIAGNOSIS — F909 Attention-deficit hyperactivity disorder, unspecified type: Secondary | ICD-10-CM | POA: Insufficient documentation

## 2017-08-26 DIAGNOSIS — I10 Essential (primary) hypertension: Secondary | ICD-10-CM | POA: Insufficient documentation

## 2017-08-26 DIAGNOSIS — Z79899 Other long term (current) drug therapy: Secondary | ICD-10-CM | POA: Insufficient documentation

## 2017-08-26 DIAGNOSIS — G2581 Restless legs syndrome: Secondary | ICD-10-CM | POA: Insufficient documentation

## 2017-08-26 DIAGNOSIS — R0602 Shortness of breath: Secondary | ICD-10-CM | POA: Insufficient documentation

## 2017-08-26 DIAGNOSIS — E559 Vitamin D deficiency, unspecified: Secondary | ICD-10-CM | POA: Diagnosis not present

## 2017-08-26 DIAGNOSIS — F4310 Post-traumatic stress disorder, unspecified: Secondary | ICD-10-CM | POA: Insufficient documentation

## 2017-08-26 DIAGNOSIS — N920 Excessive and frequent menstruation with regular cycle: Secondary | ICD-10-CM | POA: Diagnosis not present

## 2017-08-26 DIAGNOSIS — M797 Fibromyalgia: Secondary | ICD-10-CM | POA: Insufficient documentation

## 2017-08-26 MED ORDER — SODIUM CHLORIDE 0.9 % IV SOLN
510.0000 mg | Freq: Once | INTRAVENOUS | Status: AC
  Administered 2017-08-26: 510 mg via INTRAVENOUS
  Filled 2017-08-26: qty 17

## 2017-08-26 MED ORDER — SODIUM CHLORIDE 0.9 % IV SOLN
Freq: Once | INTRAVENOUS | Status: AC
  Administered 2017-08-26: 14:00:00 via INTRAVENOUS
  Filled 2017-08-26: qty 1000

## 2017-08-26 NOTE — Progress Notes (Signed)
Patient here for followup for h/o IDA. Patient states that she had the mirena IUD replaced and her heavy menses has resolved.

## 2017-08-26 NOTE — Assessment & Plan Note (Addendum)
#   Iron deficiency anemia; today hemoglobin 12; but  secondary to heavy menstrual periods. PO intol to iron. On intermittent IV Feraheme. Patient is  symptomatic. Feraheme today.  # Heavy menstrual periods/menorrhagia- on Mirena/no menses for last 3 months.  # follow up in 6 months/Ferrahem/iron studies/cbc. [might not need ferrahem] 

## 2017-08-26 NOTE — Progress Notes (Signed)
[facility] OFFICE PROGRESS NOTE  Patient Care Team: Steele Sizer, MD as PCP - General  Cancer Staging No matching staging information was found for the patient.    No history exists.   # CHRONIC IRON DEFICIENCY ANEMIA sec to menstrual blood loss [2014- Dr.Pandit]; NO EGD/colonoscopy  # Menorrhagia- on BCPs;   INTERVAL HISTORY:  Heather Jefferson 46 y.o.  female pleasant patient above history of chronic iron deficiency anemia secondary to menstrual blood loss is here for follow-up.  Patient had a recent mirena inserted by gynecology.  She has not had menstrual.  For the last 3 months.   No blood in stools black stools.  She is not on by mouth iron because of poor tolerance/constipation.  Patient denies any significant shortness of breath at rest. She complains of mild shortness of breath on exertion.  She does complain of mild fatigue.  Denies any blood in stools or black stools.   REVIEW OF SYSTEMS:  A complete 10 point review of system is done which is negative except mentioned above/history of present illness.   PAST MEDICAL HISTORY :  Past Medical History:  Diagnosis Date  . ADHD (attention deficit hyperactivity disorder)   . Allergy   . Anemia   . Anxiety   . Depression   . Fibromyalgia   . Generalized anxiety disorder   . Headache   . HIV infection ([place])   . Hypertension   . Major depressive disorder, recurrent episode, moderate ([place])   . Migraine with acute onset aura   . Persistent disorder of initiating or maintaining sleep   . PTSD (post-traumatic stress disorder)   . Restless leg   . Seizure disorder ([place])   . Thyroid disease   . Vitamin D deficiency     PAST SURGICAL HISTORY :   Past Surgical History:  Procedure Laterality Date  . EYE SURGERY    . TUBAL LIGATION      FAMILY HISTORY :   Family History  Problem Relation Age of Onset  . Hypertension Mother   . Heart attack Mother   . CAD Mother   . Diabetes Father   .  Hypertension Father   . Hypertension Sister   . Anxiety disorder Brother   . Depression Brother     SOCIAL HISTORY:   Social History   Tobacco Use  . Smoking status: Never Smoker  . Smokeless tobacco: Never Used  Substance Use Topics  . Alcohol use: Yes    Alcohol/week: 0.0 oz    Comment: social drinker  . Drug use: No    ALLERGIES:  is allergic to amoxicillin-pot clavulanate and losartan.  MEDICATIONS:  Current Outpatient Medications  Medication Sig Dispense Refill  . ARIPiprazole (ABILIFY) 10 MG tablet Take 1 tablet (10 mg total) by mouth daily. 90 tablet 1  . butalbital-acetaminophen-caffeine (FIORICET WITH CODEINE) 50-325-40-30 MG capsule Take 1 capsule by mouth every 6 (six) hours as needed for headache. 30 capsule 1  . clonazePAM (KLONOPIN) 0.5 MG tablet Take 1 tablet (0.5 mg total) by mouth at bedtime. 30 tablet 2  . desvenlafaxine (PRISTIQ) 100 MG 24 hr tablet Take 1 tablet (100 mg total) by mouth daily. 90 tablet 2  . hydroxychloroquine (PLAQUENIL) 200 MG tablet Take 200 mg by mouth 2 (two) times daily.     Marland Kitchen levothyroxine (SYNTHROID, LEVOTHROID) 25 MCG tablet Take 1 tablet (25 mcg total) by mouth daily. Take 2 tablets twice weekly, otherwise take 1 tablet daily. 90 tablet 0  .  MIRENA, 52 MG, 20 MCG/24HR IUD     . prednisoLONE acetate (PRED FORTE) 1 % ophthalmic suspension Place 1 drop into the right eye daily.     Marland Kitchen spironolactone-hydrochlorothiazide (ALDACTAZIDE) 25-25 MG tablet TAKE 1 TABLET DAILY FOR BLOOD PRESSURE. STOP LOSARTAN. 90 tablet 1  . SUMAtriptan (IMITREX) 20 MG/ACT nasal spray Place 20 mg into the nose every 2 (two) hours as needed for migraine.     . topiramate (TOPAMAX) 50 MG tablet Take 1 tablet (50 mg total) by mouth 2 (two) times daily. 180 tablet 1  . tranexamic acid (LYSTEDA) 650 MG TABS tablet Take 650 mg by mouth as needed (heavey menses).      No current facility-administered medications for this visit.     PHYSICAL EXAMINATION: ECOG  PERFORMANCE STATUS: 0 - Asymptomatic  BP 125/72 (BP Location: Left Arm, Patient Position: Sitting)   Pulse 82   Temp 97.8 F (36.6 C) (Tympanic)   Resp 20   Ht 5\' 2"  (1.575 m)   Wt 246 lb (111.6 kg)   BMI 44.99 kg/m   Filed Weights   08/26/17 1345  Weight: 246 lb (111.6 kg)    GENERAL: Well-nourished well-developed; Alert, no distress and comfortable.   Alone.  EYES: no pallor or icterus OROPHARYNX: no thrush or ulceration; good dentition  NECK: supple, no masses felt LYMPH:  no palpable lymphadenopathy in the cervical, axillary or inguinal regions LUNGS: clear to auscultation and  No wheeze or crackles HEART/CVS: regular rate & rhythm and no murmurs; No lower extremity edema ABDOMEN:abdomen soft, non-tender and normal bowel sounds Musculoskeletal:no cyanosis of digits and no clubbing  PSYCH: alert & oriented x 3 with fluent speech NEURO: no focal motor/sensory deficits SKIN:  no rashes or significant lesions  LABORATORY DATA:  I have reviewed the data as listed    Component Value Date/Time   NA 138 05/29/2017 0822   NA 138 03/11/2013 1337   K 3.9 05/29/2017 0822   K 3.8 03/11/2013 1337   CL 103 05/29/2017 0822   CL 100 03/11/2013 1337   CO2 23 05/29/2017 0822   CO2 27 03/11/2013 1337   GLUCOSE 100 (H) 05/29/2017 0822   GLUCOSE 114 (H) 03/11/2013 1337   BUN 9 05/29/2017 0822   BUN 14.3 03/11/2013 1337   CREATININE 0.74 05/29/2017 0822   CREATININE 0.8 03/11/2013 1337   CALCIUM 9.3 05/29/2017 0822   CALCIUM 9.4 03/11/2013 1337   PROT 7.0 05/29/2017 0822   PROT 7.7 03/11/2013 1337   ALBUMIN 4.1 10/18/2016 0901   ALBUMIN 3.3 (L) 03/11/2013 1337   AST 12 05/29/2017 0822   AST 12 03/11/2013 1337   ALT 10 05/29/2017 0822   ALT 12 03/11/2013 1337   ALKPHOS 68 10/18/2016 0901   ALKPHOS 117 03/11/2013 1337   BILITOT 0.3 05/29/2017 0822   BILITOT <0.20 Repeated and Verified 03/11/2013 1337   GFRNONAA 96 05/29/2017 0822   GFRAA 112 05/29/2017 0822    No results  found for: SPEP, UPEP  Lab Results  Component Value Date   WBC 7.6 08/23/2017   NEUTROABS 4.7 08/23/2017   HGB 12.9 08/23/2017   HCT 39.1 08/23/2017   MCV 83.1 08/23/2017   PLT 374 08/23/2017      Chemistry      Component Value Date/Time   NA 138 05/29/2017 0822   NA 138 03/11/2013 1337   K 3.9 05/29/2017 0822   K 3.8 03/11/2013 1337   CL 103 05/29/2017 0822   CL 100 03/11/2013  1337   CO2 23 05/29/2017 0822   CO2 27 03/11/2013 1337   BUN 9 05/29/2017 0822   BUN 14.3 03/11/2013 1337   CREATININE 0.74 05/29/2017 0822   CREATININE 0.8 03/11/2013 1337      Component Value Date/Time   CALCIUM 9.3 05/29/2017 0822   CALCIUM 9.4 03/11/2013 1337   ALKPHOS 68 10/18/2016 0901   ALKPHOS 117 03/11/2013 1337   AST 12 05/29/2017 0822   AST 12 03/11/2013 1337   ALT 10 05/29/2017 0822   ALT 12 03/11/2013 1337   BILITOT 0.3 05/29/2017 0822   BILITOT <0.20 Repeated and Verified 03/11/2013 1337       RADIOGRAPHIC STUDIES: I have personally reviewed the radiological images as listed and agreed with the findings in the report. No results found.   ASSESSMENT & PLAN:  Iron deficiency anemia due to chronic blood loss # Iron deficiency anemia; today hemoglobin 12; but  secondary to heavy menstrual periods. PO intol to iron. On intermittent IV Feraheme. Patient is  symptomatic. Feraheme today.  # Heavy menstrual periods/menorrhagia- on Mirena/no menses for last 3 months.  # follow up in 6 months/Ferrahem/iron studies/cbc.    No orders of the defined types were placed in this encounter.  All questions were answered. The patient knows to call the clinic with any problems, questions or concerns.      Cammie Sickle, MD 08/26/2017 1:56 PM

## 2017-09-09 ENCOUNTER — Encounter: Payer: Self-pay | Admitting: Psychiatry

## 2017-09-09 ENCOUNTER — Other Ambulatory Visit: Payer: Self-pay

## 2017-09-09 ENCOUNTER — Ambulatory Visit (INDEPENDENT_AMBULATORY_CARE_PROVIDER_SITE_OTHER): Payer: Medicare Other | Admitting: Psychiatry

## 2017-09-09 VITALS — BP 125/76 | HR 78 | Temp 98.7°F | Wt 256.6 lb

## 2017-09-09 DIAGNOSIS — F4312 Post-traumatic stress disorder, chronic: Secondary | ICD-10-CM

## 2017-09-09 DIAGNOSIS — F331 Major depressive disorder, recurrent, moderate: Secondary | ICD-10-CM | POA: Diagnosis not present

## 2017-09-09 MED ORDER — DESVENLAFAXINE SUCCINATE ER 100 MG PO TB24: 100.0000 mg | ORAL_TABLET | Freq: Every day | ORAL | 2 refills | Status: DC

## 2017-09-09 MED ORDER — ARIPIPRAZOLE 10 MG PO TABS
10.0000 mg | ORAL_TABLET | Freq: Every day | ORAL | 1 refills | Status: DC
Start: 2017-09-09 — End: 2017-11-04

## 2017-09-09 MED ORDER — TOPIRAMATE 50 MG PO TABS: 50.0000 mg | ORAL_TABLET | Freq: Two times a day (BID) | ORAL | 1 refills | Status: DC

## 2017-09-09 MED ORDER — CLONAZEPAM 0.5 MG PO TABS: 0.5000 mg | ORAL_TABLET | Freq: Every day | ORAL | 2 refills | Status: DC

## 2017-09-09 NOTE — Progress Notes (Signed)
BH MD/PA/NP OP Progress Note  09/09/2017 9:57 AM Heather Jefferson  MRN:  [id number]  Subjective:    Patient is a 46 year old female who presented for the follow-up appointment. She reported that she is anxious as her husband is not willing to go to her family visit in [place] during the holidays. Patient reported that she is trying to convince her for the same. Patient reported that she is planning for the holidays at this time. She has been compliant with her medications. She reported that her migraine headaches have been improving and she does not have any symptoms at this time. She currently denied having any suicidal ideations or plans. Patient reported that her anxiety is under control. She appeared calm and alert during the interview. We discussed about strategies to help with her husband and her coping skills. She reported that she does not feel depressed or anxious at this time. She reported that she has been sleeping well with the help of the Topamax and Klonopin occasionally she wakes up early in the morning. She does not have any acute symptoms at this time. .       Chief Complaint:  Chief Complaint    Follow-up; Medication Refill     Visit Diagnosis:     ICD-10-CM   1. MDD (major depressive disorder), recurrent episode, moderate (HCC) F33.1   2. Chronic post-traumatic stress disorder (PTSD) F43.12     Past Medical History:  Past Medical History:  Diagnosis Date  . ADHD (attention deficit hyperactivity disorder)   . Allergy   . Anemia   . Anxiety   . Depression   . Fibromyalgia   . Generalized anxiety disorder   . Headache   . HIV infection ([place])   . Hypertension   . Major depressive disorder, recurrent episode, moderate ([place])   . Migraine with acute onset aura   . Persistent disorder of initiating or maintaining sleep   . PTSD (post-traumatic stress disorder)   . Restless leg   . Seizure disorder ([employer])   . Thyroid disease   . Vitamin D deficiency     Past  Surgical History:  Procedure Laterality Date  . EYE SURGERY    . TUBAL LIGATION     Family History:  Family History  Problem Relation Age of Onset  . Hypertension Mother   . Heart attack Mother   . CAD Mother   . Diabetes Father   . Hypertension Father   . Hypertension Sister   . Anxiety disorder Brother   . Depression Brother    Social History:  Social History   Socioeconomic History  . Marital status: Married    Spouse name: None  . Number of children: None  . Years of education: None  . Highest education level: None  Social Needs  . Financial resource strain: None  . Food insecurity - worry: None  . Food insecurity - inability: None  . Transportation needs - medical: None  . Transportation needs - non-medical: None  Occupational History  . None  Tobacco Use  . Smoking status: Never Smoker  . Smokeless tobacco: Never Used  Substance and Sexual Activity  . Alcohol use: Yes    Alcohol/week: 0.0 oz    Comment: social drinker  . Drug use: No  . Sexual activity: Yes    Partners: Male    Birth control/protection: Other-see comments    Comment: Tubal Ligation  Other Topics Concern  . 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 She has good relationship with them.  Assessment:   Musculoskeletal: Strength & Muscle Tone: within normal limits Gait & Station: normal Patient leans: N/A  Psychiatric Specialty Exam: Medication Refill   Anxiety       ROS  Blood pressure 125/76, pulse 78, temperature 98.7 F (37.1 C), temperature source Oral, weight 256 lb 9.6 oz (116.4 kg).Body mass index is 46.93 kg/m.  General Appearance: Casual  Eye Contact:  Fair  Speech:  Clear and Coherent  Volume:  Normal  Mood:  Anxious and Depressed  Affect:  Congruent  Thought  Process:  Logical  Orientation:  Full (Time, Place, and Person)  Thought Content:  WDL  Suicidal Thoughts:  No  Homicidal Thoughts:  No  Memory:  Immediate;   Fair  Judgement:  Fair  Insight:  Fair  Psychomotor Activity:  Decreased  Concentration:  Fair  Recall:  [employer] of Knowledge: Fair  Language: Fair  Akathisia:  No  Handed:  Right  AIMS (if indicated):  none  Assets:  Communication Skills Desire for Improvement Physical Health  ADL's:  Intact  Cognition: WNL  Sleep:  6-8   Is the patient at risk to self?  No. Has the patient been a risk to self in the past 6 months?  No. Has the patient been a risk to self within the distant past?  No. Is the patient a risk to others?  No. Has the patient been a risk to others in the past 6 months?  No. Has the patient been a risk to others within the distant past?  No.  Current Medications: Current Outpatient Medications  Medication Sig Dispense Refill  . ARIPiprazole (ABILIFY) 10 MG tablet Take 1 tablet (10 mg total) by mouth daily. 90 tablet 1  . butalbital-acetaminophen-caffeine (FIORICET WITH CODEINE) 50-325-40-30 MG capsule Take 1 capsule by mouth every 6 (six) hours as needed for headache. 30 capsule 1  . clonazePAM (KLONOPIN) 0.5 MG tablet Take 1 tablet (0.5 mg total) by mouth at bedtime. 30 tablet 2  . desvenlafaxine (PRISTIQ) 100 MG 24 hr tablet Take 1 tablet (100 mg total) by mouth daily. 90 tablet 2  . hydroxychloroquine (PLAQUENIL) 200 MG tablet Take 200 mg by mouth 2 (two) times daily.     Marland Kitchen levothyroxine (SYNTHROID, LEVOTHROID) 25 MCG tablet Take 1 tablet (25 mcg total) by mouth daily. Take 2 tablets twice weekly, otherwise take 1 tablet daily. 90 tablet 0  . MIRENA, 52 MG, 20 MCG/24HR IUD     . prednisoLONE acetate (PRED FORTE) 1 % ophthalmic suspension Place 1 drop into the right eye daily.     Marland Kitchen spironolactone-hydrochlorothiazide (ALDACTAZIDE) 25-25 MG tablet TAKE 1 TABLET DAILY FOR BLOOD PRESSURE. STOP LOSARTAN. 90  tablet 1  . SUMAtriptan (IMITREX) 20 MG/ACT nasal spray Place 20 mg into the nose every 2 (two) hours as needed for migraine.     . topiramate (TOPAMAX) 50 MG tablet Take 1 tablet (50 mg total) by mouth 2 (two) times daily. 180 tablet 1  . tranexamic acid (LYSTEDA) 650 MG TABS tablet Take 650 mg by mouth as needed (heavey menses).      No current facility-administered medications for this visit.     Medical Decision Making:  Established Problem, Stable/Improving (1), Review of Psycho-Social  Stressors (1) and Review of Medication Regimen & Side Effects (2)  Treatment Plan Summary:Medication management     Discussed with patient about her medications treatment risks benefits and alternatives.  Pristiq 100 mg in the morning. Abilify 10 mg daily at bedtime. Continue Klonopin 0.5 mg by mouth daily at bedtime when necessary.   Topamax 50 mg by mouth twice a day. She will follow-up in 3 month.   More than 50% of the time spent in psychoeducation, counseling and coordination of care.    This note was generated in part or whole with voice recognition software. Voice regonition is usually quite accurate but there are transcription errors that can and very often do occur. I apologize for any typographical errors that were not detected and corrected.   Rainey Pines, MD  09/09/2017, 9:57 AM

## 2017-09-12 ENCOUNTER — Other Ambulatory Visit: Payer: Self-pay | Admitting: Family Medicine

## 2017-09-12 DIAGNOSIS — E039 Hypothyroidism, unspecified: Secondary | ICD-10-CM

## 2017-09-12 NOTE — Telephone Encounter (Signed)
She needs TSH done to be able to get refill. Please contact patient, she can stop by tomorrow and have labs done.  Lab TSH Diagnosis: hypothyroidism

## 2017-09-12 NOTE — Telephone Encounter (Signed)
I believe I provided refill in Dr. Ancil Boozer' Absence. Pt was supposed to return in November for TSH check prior to obtaining additional refills. Please call pt to look into this, then forward to Dr. Ancil Boozer for further management. Thanks!

## 2017-09-13 NOTE — Telephone Encounter (Signed)
Patient states she will come by Monday and have her TSH recheck then. She took her last one today. Could you give enough until we get her blood work back. She had her TSH checked on 05/29/17.

## 2017-09-13 NOTE — Telephone Encounter (Signed)
Yes. Please call in enough until Monday

## 2017-09-13 NOTE — Telephone Encounter (Signed)
Called in a 4 day supply to [facility] for the patient. Patient has been notified.

## 2017-09-16 DIAGNOSIS — E039 Hypothyroidism, unspecified: Secondary | ICD-10-CM | POA: Diagnosis not present

## 2017-09-17 LAB — TSH: TSH: 2.26 m[IU]/L

## 2017-09-18 MED ORDER — LEVOTHYROXINE SODIUM 25 MCG PO TABS: 25.0000 ug | ORAL_TABLET | Freq: Every day | ORAL | 0 refills | Status: DC

## 2017-09-18 NOTE — Telephone Encounter (Signed)
Patient has been informed.  Threasa Beards heard me giving this patient her lab results and stated that she will need a refill of her Levothyroxine b/c they only gave her a 7 day supply until she had her labs done.  Refill request was sent to Raelyn Ensign, FNP for approval and submission.

## 2017-09-18 NOTE — Telephone Encounter (Signed)
-----   Message from Hubbard Hartshorn, FNP sent at 09/18/2017  7:54 AM EST ----- Normal TSH, no need to change levothyroxine dosing at this time.

## 2017-09-18 NOTE — Telephone Encounter (Signed)
10-day supply sent to [facility] while she awaits mail-order. 90-day supply provided to mail-order. Pt has follow up with PCP Dr. Ancil Boozer 10/16/2016.

## 2017-09-19 ENCOUNTER — Other Ambulatory Visit: Payer: Self-pay

## 2017-09-19 ENCOUNTER — Other Ambulatory Visit: Payer: Self-pay | Admitting: Family Medicine

## 2017-09-19 DIAGNOSIS — E039 Hypothyroidism, unspecified: Secondary | ICD-10-CM

## 2017-09-19 MED ORDER — LEVOTHYROXINE SODIUM 25 MCG PO TABS: 25.0000 ug | ORAL_TABLET | Freq: Every day | ORAL | 0 refills | Status: DC

## 2017-09-19 NOTE — Progress Notes (Signed)
[facility] uses APP as there manufacturer. They do no have a prescription for her Synthyroid. Please send in prescription.

## 2017-09-19 NOTE — Progress Notes (Signed)
Refill to [facility] sent x90 day supply. Please call pt and ask if she has enough to hold her over until this arrives.

## 2017-09-19 NOTE — Progress Notes (Signed)
[facility] is unable to fill patient's synthroid from the same manufacturer that she normally gets her synthroid from.  I had only sent in a temporary supply to get her through until [place] her a 90-day supply.  Please call [facility] to find out if they are also needing to change the manufacturer for the same reason.  If so, please ask them which manufacturer they will change to so that [facility] may change to the same manufacturer. Also ask how long it will be until pt received her medication.

## 2017-09-19 NOTE — Telephone Encounter (Signed)
Refill request for thyroid medication: Levothyroxine 25 mcg  Last Physical: None indicated  Follow up: 10/16/2017  Lab Results  Component Value Date   TSH 2.26 09/16/2017

## 2017-09-20 NOTE — Progress Notes (Signed)
Called patient to see if she has enough Synthroid to last her until her medication from [facility] comes in. If she does not have enough then PCP will have to change medication to a different Manufacturer. Called @ 9:44 @[phone].

## 2017-10-16 ENCOUNTER — Ambulatory Visit (INDEPENDENT_AMBULATORY_CARE_PROVIDER_SITE_OTHER): Payer: Medicare Other | Admitting: Family Medicine

## 2017-10-16 ENCOUNTER — Encounter: Payer: Self-pay | Admitting: Family Medicine

## 2017-10-16 DIAGNOSIS — Z23 Encounter for immunization: Secondary | ICD-10-CM

## 2017-10-16 DIAGNOSIS — E785 Hyperlipidemia, unspecified: Secondary | ICD-10-CM | POA: Diagnosis not present

## 2017-10-16 DIAGNOSIS — F339 Major depressive disorder, recurrent, unspecified: Secondary | ICD-10-CM

## 2017-10-16 DIAGNOSIS — M05741 Rheumatoid arthritis with rheumatoid factor of right hand without organ or systems involvement: Secondary | ICD-10-CM

## 2017-10-16 DIAGNOSIS — R739 Hyperglycemia, unspecified: Secondary | ICD-10-CM | POA: Diagnosis not present

## 2017-10-16 DIAGNOSIS — G43009 Migraine without aura, not intractable, without status migrainosus: Secondary | ICD-10-CM

## 2017-10-16 DIAGNOSIS — D508 Other iron deficiency anemias: Secondary | ICD-10-CM

## 2017-10-16 DIAGNOSIS — E039 Hypothyroidism, unspecified: Secondary | ICD-10-CM

## 2017-10-16 DIAGNOSIS — E559 Vitamin D deficiency, unspecified: Secondary | ICD-10-CM | POA: Diagnosis not present

## 2017-10-16 DIAGNOSIS — I10 Essential (primary) hypertension: Secondary | ICD-10-CM | POA: Diagnosis not present

## 2017-10-16 DIAGNOSIS — E8881 Metabolic syndrome: Secondary | ICD-10-CM

## 2017-10-16 DIAGNOSIS — K219 Gastro-esophageal reflux disease without esophagitis: Secondary | ICD-10-CM

## 2017-10-16 DIAGNOSIS — M05742 Rheumatoid arthritis with rheumatoid factor of left hand without organ or systems involvement: Secondary | ICD-10-CM

## 2017-10-16 MED ORDER — BUTALBITAL-APAP-CAFF-COD 50-325-40-30 MG PO CAPS: 1.0000 | ORAL_CAPSULE | Freq: Four times a day (QID) | ORAL | 1 refills | Status: DC | PRN

## 2017-10-16 MED ORDER — SPIRONOLACTONE-HCTZ 25-25 MG PO TABS: ORAL_TABLET | ORAL | 1 refills | Status: DC

## 2017-10-16 MED ORDER — SUMATRIPTAN 20 MG/ACT NA SOLN: 20.0000 mg | NASAL | 2 refills | Status: DC | PRN

## 2017-10-16 MED ORDER — SEMAGLUTIDE (1 MG/DOSE) 2 MG/1.5ML ~~LOC~~ SOPN: 1.0000 mg | PEN_INJECTOR | SUBCUTANEOUS | 1 refills | Status: DC

## 2017-10-16 MED ORDER — SEMAGLUTIDE (1 MG/DOSE) 2 MG/1.5ML ~~LOC~~ SOPN: 1.0000 mg | PEN_INJECTOR | SUBCUTANEOUS | 2 refills | Status: DC
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
## 2017-10-18 DIAGNOSIS — E785 Hyperlipidemia, unspecified: Secondary | ICD-10-CM | POA: Diagnosis not present

## 2017-10-18 DIAGNOSIS — E559 Vitamin D deficiency, unspecified: Secondary | ICD-10-CM | POA: Diagnosis not present

## 2017-10-18 DIAGNOSIS — R739 Hyperglycemia, unspecified: Secondary | ICD-10-CM | POA: Diagnosis not present

## 2017-10-21 LAB — VITAMIN D 25 HYDROXY (VIT D DEFICIENCY, FRACTURES): VIT D 25 HYDROXY: 19 ng/mL — AB (ref 30–100)

## 2017-10-21 LAB — HEMOGLOBIN A1C
Hgb A1c MFr Bld: 5.9 % of total Hgb — ABNORMAL HIGH (ref <5.7)
Mean Plasma Glucose: 123 (calc)
eAG (mmol/L): 6.8 (calc)

## 2017-10-21 LAB — LIPID PANEL
Cholesterol: 224 mg/dL — ABNORMAL HIGH (ref <200)
HDL: 67 mg/dL (ref >50)
LDL CHOLESTEROL (CALC): 133 mg/dL — AB
NON-HDL CHOLESTEROL (CALC): 157 mg/dL — AB (ref <130)
TRIGLYCERIDES: 128 mg/dL (ref <150)
Total CHOL/HDL Ratio: 3.3 (calc) (ref <5.0)

## 2017-10-21 LAB — INSULIN, RANDOM: Insulin: 38.7 u[IU]/mL — ABNORMAL HIGH (ref 2.0–19.6)

## 2017-10-25 ENCOUNTER — Telehealth: Payer: Self-pay | Admitting: Family Medicine

## 2017-10-25 NOTE — Telephone Encounter (Signed)
Copied from [place] [id number]. Topic: Quick Communication - Rx Refill/Question >> Oct 25, 2017  2:59 PM Oliver Pila B wrote: Medication: Semaglutide [facility]) 1 MG/DOSE [facility] [[id number]]   Pt called to state this Rx is needing a prior auth to be filled contact pt to advise if needed

## 2017-10-25 NOTE — Telephone Encounter (Signed)
Pa will be done when pharmacy faxes it to our office.

## 2017-11-04 ENCOUNTER — Ambulatory Visit (INDEPENDENT_AMBULATORY_CARE_PROVIDER_SITE_OTHER): Payer: BLUE CROSS/BLUE SHIELD | Admitting: Psychiatry

## 2017-11-04 ENCOUNTER — Encounter: Payer: Self-pay | Admitting: Psychiatry

## 2017-11-04 ENCOUNTER — Other Ambulatory Visit: Payer: Self-pay

## 2017-11-04 VITALS — BP 119/76 | HR 80 | Temp 98.1°F | Wt 267.2 lb

## 2017-11-04 DIAGNOSIS — F331 Major depressive disorder, recurrent, moderate: Secondary | ICD-10-CM

## 2017-11-04 DIAGNOSIS — F4312 Post-traumatic stress disorder, chronic: Secondary | ICD-10-CM

## 2017-11-04 MED ORDER — DESVENLAFAXINE SUCCINATE ER 100 MG PO TB24: 100.0000 mg | ORAL_TABLET | Freq: Every day | ORAL | 2 refills | Status: DC

## 2017-11-04 MED ORDER — TOPIRAMATE 50 MG PO TABS: 50.0000 mg | ORAL_TABLET | Freq: Two times a day (BID) | ORAL | 1 refills | Status: DC

## 2017-11-04 MED ORDER — ARIPIPRAZOLE 10 MG PO TABS: 10.0000 mg | ORAL_TABLET | Freq: Every day | ORAL | 1 refills | Status: DC

## 2017-11-04 MED ORDER — CLONAZEPAM 0.5 MG PO TABS: 0.5000 mg | ORAL_TABLET | Freq: Every day | ORAL | 2 refills | Status: DC

## 2017-11-04 NOTE — Progress Notes (Signed)
BH MD/PA/NP OP Progress Note  11/04/2017 9:23 AM Heather Jefferson  MRN:  [id number]  Subjective:    Patient is a 47 year old female who presented for the follow-up appointment. She reported that she is anxious about her son who has been having rashes since he was started on minocycline 2 weeks ago. She reported that she is planning to take him to the primary care physician to have him evaluated as he has been taking the medication for his acne. She reported that she has been compliant with her medications and they are helping her. She reported that she has been diagnosed with diabetes recently and she is trying to lose weight. She is unable to exercise due to the weather. She appeared calm and alert during the interview. She reported that she does not have any side effects of medications. She reported that they are helping her with her mood symptoms and she feels calm and stable. Her migraine headaches are also under control. She does not have any suicidal homicidal ideations or plans.         Chief Complaint:  Chief Complaint    Follow-up; Medication Refill     Visit Diagnosis:     ICD-10-CM   1. MDD (major depressive disorder), recurrent episode, moderate (HCC) F33.1   2. Chronic post-traumatic stress disorder (PTSD) F43.12     Past Medical History:  Past Medical History:  Diagnosis Date  . ADHD (attention deficit hyperactivity disorder)   . Allergy   . Anemia   . Anxiety   . Depression   . Diabetes mellitus, type II ([place])   . Fibromyalgia   . Generalized anxiety disorder   . Headache   . HIV infection ([place])   . Hypertension   . Major depressive disorder, recurrent episode, moderate ([place])   . Migraine with acute onset aura   . Persistent disorder of initiating or maintaining sleep   . PTSD (post-traumatic stress disorder)   . Restless leg   . Seizure disorder ([place])   . Thyroid disease   . Vitamin D deficiency     Past Surgical History:  Procedure Laterality Date   . EYE SURGERY    . TUBAL LIGATION     Family History:  Family History  Problem Relation Age of Onset  . Hypertension Mother   . Heart attack Mother   . CAD Mother   . Diabetes Father   . Hypertension Father   . Hypertension Sister   . Anxiety disorder Brother   . Depression Brother    Social History:  Social History   Socioeconomic History  . Marital status: Married    Spouse name: None  . Number of children: None  . Years of education: None  . Highest education level: None  Social Needs  . Financial resource strain: None  . Food insecurity - worry: None  . Food insecurity - inability: None  . Transportation needs - medical: None  . Transportation needs - non-medical: None  Occupational History  . None  Tobacco Use  . Smoking status: Never Smoker  . Smokeless tobacco: Never Used  Substance and Sexual Activity  . Alcohol use: Yes    Alcohol/week: 0.0 oz    Comment: social drinker  . Drug use: No  . Sexual activity: Yes    Partners: Male    Birth control/protection: Other-see comments    Comment: Tubal Ligation  Other Topics Concern  . 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 She has good relationship with them.  Assessment:   Musculoskeletal: Strength & Muscle Tone: within normal limits Gait & Station: normal Patient leans: N/A  Psychiatric Specialty Exam: Medication Refill   Anxiety       ROS  Blood pressure 119/76, pulse 80, temperature 98.1 F (36.7 C), temperature source Oral, weight 267 lb 3.2 oz (121.2 kg).Body mass index is 48.87 kg/m.  General Appearance: Casual  Eye Contact:  Fair  Speech:  Clear and Coherent  Volume:  Normal  Mood:  Anxious and Depressed  Affect:  Congruent  Thought Process:  Logical  Orientation:  Full (Time, Place,  and Person)  Thought Content:  WDL  Suicidal Thoughts:  No  Homicidal Thoughts:  No  Memory:  Immediate;   Fair  Judgement:  Fair  Insight:  Fair  Psychomotor Activity:  Decreased  Concentration:  Fair  Recall:  [employer] of Knowledge: Fair  Language: Fair  Akathisia:  No  Handed:  Right  AIMS (if indicated):  none  Assets:  Communication Skills Desire for Improvement Physical Health  ADL's:  Intact  Cognition: WNL  Sleep:  6-8   Is the patient at risk to self?  No. Has the patient been a risk to self in the past 6 months?  No. Has the patient been a risk to self within the distant past?  No. Is the patient a risk to others?  No. Has the patient been a risk to others in the past 6 months?  No. Has the patient been a risk to others within the distant past?  No.  Current Medications: Current Outpatient Medications  Medication Sig Dispense Refill  . ARIPiprazole (ABILIFY) 10 MG tablet Take 1 tablet (10 mg total) by mouth daily. 90 tablet 1  . butalbital-acetaminophen-caffeine (FIORICET WITH CODEINE) 50-325-40-30 MG capsule Take 1 capsule by mouth every 6 (six) hours as needed for headache. 30 capsule 1  . clonazePAM (KLONOPIN) 0.5 MG tablet Take 1 tablet (0.5 mg total) by mouth at bedtime. 30 tablet 2  . desvenlafaxine (PRISTIQ) 100 MG 24 hr tablet Take 1 tablet (100 mg total) by mouth daily. 90 tablet 2  . hydroxychloroquine (PLAQUENIL) 200 MG tablet Take 200 mg by mouth 2 (two) times daily.     Marland Kitchen levothyroxine (SYNTHROID, LEVOTHROID) 25 MCG tablet Take 1 tablet (25 mcg total) by mouth daily. Take 2 tablets twice weekly, otherwise take 1 tablet daily. 115 tablet 0  . MIRENA, 52 MG, 20 MCG/24HR IUD     . prednisoLONE acetate (PRED FORTE) 1 % ophthalmic suspension Place 1 drop into the right eye daily.     . Semaglutide (OZEMPIC) 1 MG/DOSE SOPN Inject 1 mg into the skin once a week. 3 mL 2  . spironolactone-hydrochlorothiazide (ALDACTAZIDE) 25-25 MG tablet TAKE 1 TABLET DAILY  FOR BLOOD PRESSURE. STOP LOSARTAN. 90 tablet 1  . SUMAtriptan (IMITREX) 20 MG/ACT nasal spray Place 1 spray (20 mg total) into the nose every 2 (two) hours as needed for migraine. 3 Inhaler 2  . topiramate (TOPAMAX) 50 MG tablet Take 1 tablet (50 mg total) by mouth 2 (two) times daily. 180 tablet 1  . tranexamic acid (LYSTEDA) 650 MG TABS tablet Take 650 mg by mouth as needed (heavey menses).      No current facility-administered  medications for this visit.     Medical Decision Making:  Established Problem, Stable/Improving (1), Review of Psycho-Social Stressors (1) and Review of Medication Regimen & Side Effects (2)  Treatment Plan Summary:Medication management     Discussed with patient about her medications treatment risks benefits and alternatives.  Pristiq 100 mg in the morning. Abilify 10 mg daily at bedtime. Continue Klonopin 0.5 mg by mouth daily at bedtime when necessary.   Topamax 50 mg by mouth twice a day. She will follow-up in 3 month.   More than 50% of the time spent in psychoeducation, counseling and coordination of care.    This note was generated in part or whole with voice recognition software. Voice regonition is usually quite accurate but there are transcription errors that can and very often do occur. I apologize for any typographical errors that were not detected and corrected.   Rainey Pines, MD  11/04/2017, 9:23 AM

## 2017-11-28 ENCOUNTER — Encounter: Payer: Self-pay | Admitting: Family Medicine

## 2017-11-28 ENCOUNTER — Ambulatory Visit (INDEPENDENT_AMBULATORY_CARE_PROVIDER_SITE_OTHER): Payer: Medicare Other | Admitting: Family Medicine

## 2017-11-28 VITALS — BP 106/68 | HR 89 | Temp 98.0°F | Resp 16 | Ht 62.0 in | Wt 265.9 lb

## 2017-11-28 DIAGNOSIS — R062 Wheezing: Secondary | ICD-10-CM

## 2017-11-28 DIAGNOSIS — E8881 Metabolic syndrome: Secondary | ICD-10-CM

## 2017-11-28 DIAGNOSIS — R05 Cough: Secondary | ICD-10-CM

## 2017-11-28 DIAGNOSIS — R058 Other specified cough: Secondary | ICD-10-CM

## 2017-11-28 MED ORDER — FLUTICASONE-SALMETEROL 250-50 MCG/DOSE IN AEPB: 1.0000 | INHALATION_SPRAY | Freq: Two times a day (BID) | RESPIRATORY_TRACT | 0 refills | Status: DC

## 2017-11-28 MED ORDER — METFORMIN HCL ER 750 MG PO TB24: 750.0000 mg | ORAL_TABLET | Freq: Every day | ORAL | 2 refills | Status: DC

## 2017-11-28 MED ORDER — BENZONATATE 100 MG PO CAPS: 100.0000 mg | ORAL_CAPSULE | Freq: Three times a day (TID) | ORAL | 0 refills | Status: DC | PRN
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
## 2017-12-10 ENCOUNTER — Other Ambulatory Visit: Payer: Self-pay | Admitting: Family Medicine

## 2017-12-10 DIAGNOSIS — E039 Hypothyroidism, unspecified: Secondary | ICD-10-CM

## 2017-12-20 ENCOUNTER — Other Ambulatory Visit: Payer: Self-pay | Admitting: Family Medicine

## 2017-12-20 DIAGNOSIS — E039 Hypothyroidism, unspecified: Secondary | ICD-10-CM

## 2017-12-20 NOTE — Telephone Encounter (Signed)
Refill request for thyroid medication: Levothyroxine to [facility].  Last visit: 11/28/2017  Lab Results  Component Value Date   TSH 2.26 09/16/2017   Follow up on 02/04/2018

## 2018-01-06 DIAGNOSIS — H1031 Unspecified acute conjunctivitis, right eye: Secondary | ICD-10-CM | POA: Diagnosis not present

## 2018-01-27 ENCOUNTER — Ambulatory Visit: Payer: Medicare Other | Admitting: Psychiatry

## 2018-01-30 ENCOUNTER — Other Ambulatory Visit: Payer: Self-pay

## 2018-01-30 ENCOUNTER — Ambulatory Visit (INDEPENDENT_AMBULATORY_CARE_PROVIDER_SITE_OTHER): Payer: BLUE CROSS/BLUE SHIELD | Admitting: Psychiatry

## 2018-01-30 ENCOUNTER — Encounter: Payer: Self-pay | Admitting: Psychiatry

## 2018-01-30 VITALS — BP 123/87 | HR 73 | Temp 98.9°F | Wt 274.6 lb

## 2018-01-30 DIAGNOSIS — F4312 Post-traumatic stress disorder, chronic: Secondary | ICD-10-CM

## 2018-01-30 DIAGNOSIS — F331 Major depressive disorder, recurrent, moderate: Secondary | ICD-10-CM

## 2018-01-30 MED ORDER — ARIPIPRAZOLE 10 MG PO TABS: 15.0000 mg | ORAL_TABLET | Freq: Every day | ORAL | 1 refills | Status: DC

## 2018-01-30 NOTE — Progress Notes (Signed)
BH MD/PA/NP OP Progress Note  01/30/2018 10:04 AM Heather Jefferson  MRN:  [id number]  Subjective:    Patient is a 47 year old female who presented for the follow-up appointment. She reported that she is anxious and depressed as her husband was diagnosed with appendix cancer recently and after that he had the stroke.  She reported that he is stable now and has recovered from his medical illnesses.  She reported that she almost lost him.  She was sad and tearful during the interview.  She reported that she is also looking for her biological daughter who she gave for adoption when she was born.  Patient discussed that in detail.  Patient reported that she was 47 years old and was unable to take care of her daughter.  Her mother asked her to give her up for adoption.  Now she has contacted several agencies and is unable to find her.  She reported that she did not have the closure and is instead looking for her.  Patient reported that she has been living with her husband and her 2 other children but she is still feeling anxious and depressed about her daughter who might be 21 years old at this time.  She stated that she has been compliant with her medications but is gaining weight.  We discussed about her medications in detail.  She wants to go higher on the dose of aripiprazole as she thinks that this helps with her depression.    She currently denied having any suicidal or homicidal ideations or plans.  She denied having any perceptual disturbances.  She has been tearful and sad during the interview.            Chief Complaint:  Chief Complaint    Follow-up; Medication Refill     Visit Diagnosis:     ICD-10-CM   1. MDD (major depressive disorder), recurrent episode, moderate (HCC) F33.1   2. Chronic post-traumatic stress disorder (PTSD) F43.12     Past Medical History:  Past Medical History:  Diagnosis Date  . ADHD (attention deficit hyperactivity disorder)   . Allergy   . Anemia    . Anxiety   . Depression   . Diabetes mellitus, type II ([place])   . Fibromyalgia   . Generalized anxiety disorder   . Headache   . HIV infection ([place])   . Hypertension   . Major depressive disorder, recurrent episode, moderate ([place])   . Migraine with acute onset aura   . Persistent disorder of initiating or maintaining sleep   . PTSD (post-traumatic stress disorder)   . Restless leg   . Seizure disorder ([place])   . Thyroid disease   . Vitamin D deficiency     Past Surgical History:  Procedure Laterality Date  . EYE SURGERY    . TUBAL LIGATION     Family History:  Family History  Problem Relation Age of Onset  . Hypertension Mother   . Heart attack Mother   . CAD Mother   . Diabetes Father   . Hypertension Father   . Hypertension Sister   . Anxiety disorder Brother   . Depression Brother    Social History:  Social History   Socioeconomic History  . Marital status: Married    Spouse name: Not on file  . Number of children: Not on file  . Years of education: Not on file  . Highest education level: Not on file  Occupational History  . Not on file  Social  Needs  . Financial resource strain: Not on file  . Food insecurity:    Worry: Not on file    Inability: Not on file  . Transportation needs:    Medical: Not on file    Non-medical: Not on file  Tobacco Use  . Smoking status: Never Smoker  . Smokeless tobacco: Never Used  Substance and Sexual Activity  . Alcohol use: Yes    Alcohol/week: 0.0 oz    Comment: social drinker  . Drug use: No  . Sexual activity: Yes    Partners: Male    Birth control/protection: Other-see comments    Comment: Tubal Ligation  Lifestyle  . Physical activity:    Days per week: Not on file    Minutes per session: Not on file  . Stress: Not on file  Relationships  . Social connections:    Talks on phone: Not on file    Gets together: Not on file    Attends religious service: Not on file    Active member of club or  organization: Not on file    Attends meetings of clubs or organizations: Not on file    Relationship status: Not on file  Other Topics Concern  . 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 She has good relationship with them.  Assessment:   Musculoskeletal: Strength & Muscle Tone: within normal limits Gait & Station: normal Patient leans: N/A  Psychiatric Specialty Exam: Medication Refill   Anxiety       ROS  Blood pressure 123/87, pulse 73, temperature 98.9 F (37.2 C), temperature source Oral, weight 274 lb 9.6 oz (124.6 kg).Body mass index is 50.22 kg/m.  General Appearance: Casual  Eye Contact:  Fair  Speech:  Clear and Coherent  Volume:  Normal  Mood:  Anxious and Depressed  Affect:  Congruent  Thought Process:  Logical  Orientation:  Full (Time, Place, and Person)  Thought Content:  WDL  Suicidal Thoughts:  No  Homicidal Thoughts:  No  Memory:  Immediate;   Fair  Judgement:  Fair  Insight:  Fair  Psychomotor Activity:  Decreased  Concentration:  Fair  Recall:  [employer] of Knowledge: Fair  Language: Fair  Akathisia:  No  Handed:  Right  AIMS (if indicated):  none  Assets:  Communication Skills Desire for Improvement Physical Health  ADL's:  Intact  Cognition: WNL  Sleep:  6-8   Is the patient at risk to self?  No. Has the patient been a risk to self in the past 6 months?  No. Has the patient been a risk to self within the distant past?  No. Is the patient a risk to others?  No. Has the patient been a risk to others in the past 6 months?  No. Has the patient been a risk to others within the distant past?  No.  Current Medications: Current Outpatient Medications  Medication Sig Dispense Refill  . ARIPiprazole (ABILIFY) 10 MG tablet Take 1.5  tablets (15 mg total) by mouth daily. Pt has supply 90 tablet 1  . butalbital-acetaminophen-caffeine (FIORICET WITH CODEINE) 50-325-40-30 MG capsule Take 1 capsule by mouth every 6 (six) hours as needed for headache. 30 capsule 1  . clonazePAM (KLONOPIN) 0.5 MG tablet Take 1  tablet (0.5 mg total) by mouth at bedtime. 30 tablet 2  . desvenlafaxine (PRISTIQ) 100 MG 24 hr tablet Take 1 tablet (100 mg total) by mouth daily. 90 tablet 2  . hydroxychloroquine (PLAQUENIL) 200 MG tablet Take 200 mg by mouth 2 (two) times daily.     Marland Kitchen levothyroxine (SYNTHROID, LEVOTHROID) 25 MCG tablet TAKE 1 TABLET BY MOUTH ONCE DAILY 30 tablet 0  . metFORMIN (GLUCOPHAGE-XR) 750 MG 24 hr tablet Take 1 tablet (750 mg total) by mouth daily with breakfast. 30 tablet 2  . MIRENA, 52 MG, 20 MCG/24HR IUD     . prednisoLONE acetate (PRED FORTE) 1 % ophthalmic suspension Place 1 drop into the right eye daily.     . Semaglutide (OZEMPIC) 1 MG/DOSE SOPN Inject 1 mg into the skin once a week. 3 mL 2  . spironolactone-hydrochlorothiazide (ALDACTAZIDE) 25-25 MG tablet TAKE 1 TABLET DAILY FOR BLOOD PRESSURE. STOP LOSARTAN. 90 tablet 1  . SUMAtriptan (IMITREX) 20 MG/ACT nasal spray Place 1 spray (20 mg total) into the nose every 2 (two) hours as needed for migraine. 3 Inhaler 2  . topiramate (TOPAMAX) 50 MG tablet Take 1 tablet (50 mg total) by mouth 2 (two) times daily. 180 tablet 1  . tranexamic acid (LYSTEDA) 650 MG TABS tablet Take 650 mg by mouth as needed (heavey menses).      No current facility-administered medications for this visit.     Medical Decision Making:  Established Problem, Stable/Improving (1), Review of Psycho-Social Stressors (1) and Review of Medication Regimen & Side Effects (2)  Treatment Plan Summary:Medication management     Discussed with patient about her medications treatment risks benefits and alternatives.  Pristiq 100 mg in the morning. She is higher on the dose Abilify 15 mg daily-and has  supply. Continue Klonopin 0.5 mg by mouth daily at bedtime when necessary.   Topamax 100 mg at bedtime. She will follow-up in 1 month.   More than 50% of the time spent in psychoeducation, counseling and coordination of care.    This note was generated in part or whole with voice recognition software. Voice regonition is usually quite accurate but there are transcription errors that can and very often do occur. I apologize for any typographical errors that were not detected and corrected.   Rainey Pines, MD  01/30/2018, 10:04 AM

## 2018-02-04 ENCOUNTER — Ambulatory Visit (INDEPENDENT_AMBULATORY_CARE_PROVIDER_SITE_OTHER): Payer: BLUE CROSS/BLUE SHIELD | Admitting: Family Medicine

## 2018-02-04 ENCOUNTER — Encounter: Payer: Self-pay | Admitting: Family Medicine

## 2018-02-04 VITALS — BP 126/70 | HR 60 | Temp 98.5°F | Resp 16 | Ht 62.0 in | Wt 274.0 lb

## 2018-02-04 DIAGNOSIS — E039 Hypothyroidism, unspecified: Secondary | ICD-10-CM | POA: Diagnosis not present

## 2018-02-04 DIAGNOSIS — G43009 Migraine without aura, not intractable, without status migrainosus: Secondary | ICD-10-CM

## 2018-02-04 DIAGNOSIS — D508 Other iron deficiency anemias: Secondary | ICD-10-CM | POA: Diagnosis not present

## 2018-02-04 DIAGNOSIS — I10 Essential (primary) hypertension: Secondary | ICD-10-CM | POA: Diagnosis not present

## 2018-02-04 DIAGNOSIS — F339 Major depressive disorder, recurrent, unspecified: Secondary | ICD-10-CM | POA: Diagnosis not present

## 2018-02-04 DIAGNOSIS — E8881 Metabolic syndrome: Secondary | ICD-10-CM | POA: Diagnosis not present

## 2018-02-04 DIAGNOSIS — M05742 Rheumatoid arthritis with rheumatoid factor of left hand without organ or systems involvement: Secondary | ICD-10-CM

## 2018-02-04 DIAGNOSIS — M05741 Rheumatoid arthritis with rheumatoid factor of right hand without organ or systems involvement: Secondary | ICD-10-CM | POA: Diagnosis not present

## 2018-02-04 LAB — TSH: TSH: 2.71 mIU/L

## 2018-02-04 MED ORDER — LEVOTHYROXINE SODIUM 25 MCG PO TABS: 25.0000 ug | ORAL_TABLET | Freq: Every day | ORAL | 0 refills | Status: DC

## 2018-02-04 MED ORDER — SUMATRIPTAN 20 MG/ACT NA SOLN
20.0000 mg | NASAL | 2 refills | Status: DC | PRN
Start: 2018-02-04 — End: 2018-05-07
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
## 2018-02-24 ENCOUNTER — Inpatient Hospital Stay: Payer: Medicare Other | Admitting: Internal Medicine

## 2018-02-24 ENCOUNTER — Inpatient Hospital Stay: Payer: Medicare Other

## 2018-03-03 ENCOUNTER — Encounter: Payer: Self-pay | Admitting: Psychiatry

## 2018-03-03 ENCOUNTER — Ambulatory Visit (INDEPENDENT_AMBULATORY_CARE_PROVIDER_SITE_OTHER): Payer: BLUE CROSS/BLUE SHIELD | Admitting: Psychiatry

## 2018-03-03 ENCOUNTER — Other Ambulatory Visit: Payer: Self-pay

## 2018-03-03 VITALS — BP 117/75 | HR 80 | Temp 98.5°F | Wt 280.8 lb

## 2018-03-03 DIAGNOSIS — F331 Major depressive disorder, recurrent, moderate: Secondary | ICD-10-CM | POA: Diagnosis not present

## 2018-03-03 DIAGNOSIS — F4312 Post-traumatic stress disorder, chronic: Secondary | ICD-10-CM | POA: Diagnosis not present

## 2018-03-03 MED ORDER — CLONAZEPAM 0.5 MG PO TABS: 0.5000 mg | ORAL_TABLET | Freq: Every day | ORAL | 2 refills | Status: DC

## 2018-03-03 MED ORDER — ARIPIPRAZOLE 10 MG PO TABS: 10.0000 mg | ORAL_TABLET | Freq: Two times a day (BID) | ORAL | 1 refills | Status: DC

## 2018-03-03 MED ORDER — TOPIRAMATE 100 MG PO TABS: 100.0000 mg | ORAL_TABLET | Freq: Every day | ORAL | 1 refills | Status: DC

## 2018-03-03 MED ORDER — DESVENLAFAXINE SUCCINATE ER 100 MG PO TB24: 100.0000 mg | ORAL_TABLET | Freq: Every day | ORAL | 2 refills | Status: DC

## 2018-03-03 NOTE — Progress Notes (Signed)
BH MD/PA/NP OP Progress Note  03/03/2018 12:27 PM Heather Jefferson  MRN:  [id number]  Subjective:    Patient is a 47 year old female who presented for the follow-up appointment. She reported that she was unable to go higher on the dose of the Abilify as the medication pill was difficult to split into half.  She is a still taking 10 mg.  Patient reported that she continues to have crying spells and episodes of depression.  She reported that she wants to go higher on the dose of the Abilify.  She reported that she is agreeable to try taking 20 mg on alternate days.  Patient reported that she has anxiety related to stress and remains anxious and depressed as her husband was diagnosed with appendix cancer recently and after that he had the stroke.  She reported that he is stable now and has recovered from his medical illnesses.    She is 'emotional eater" and has not lost any weight as she continues to binge eat during times of stress.   She reported that her 17 year old daughter busy and she was having sleep over with her friends at their house and she was supporting them.  Patient reported that she was also having a migraine headache yesterday spent time at home taking her medications.  She reported that she is trying to recuperate and takes her medications as prescribed.    She appears calm and alert during the interview.  She denied having any suicidal homicidal ideations or plans.  She denied having any perceptual disturbances.      Chief Complaint:  Chief Complaint    Follow-up; Medication Refill     Visit Diagnosis:     ICD-10-CM   1. MDD (major depressive disorder), recurrent episode, moderate (HCC) F33.1   2. Chronic post-traumatic stress disorder (PTSD) F43.12     Past Medical History:  Past Medical History:  Diagnosis Date  . ADHD (attention deficit hyperactivity disorder)   . Allergy   . Anemia   . Anxiety   . Depression   . Diabetes mellitus, type II ([place])   .  Fibromyalgia   . Generalized anxiety disorder   . Headache   . HIV infection ([place])   . Hypertension   . Major depressive disorder, recurrent episode, moderate ([place])   . Migraine with acute onset aura   . Persistent disorder of initiating or maintaining sleep   . PTSD (post-traumatic stress disorder)   . Restless leg   . Seizure disorder ([place])   . Thyroid disease   . Vitamin D deficiency     Past Surgical History:  Procedure Laterality Date  . EYE SURGERY    . TUBAL LIGATION     Family History:  Family History  Problem Relation Age of Onset  . Hypertension Mother   . Heart attack Mother   . CAD Mother   . Diabetes Father   . Hypertension Father   . Hypertension Sister   . Anxiety disorder Brother   . Depression Brother    Social History:  Social History   Socioeconomic History  . Marital status: Married    Spouse name: Not on file  . Number of children: Not on file  . Years of education: Not on file  . Highest education level: Not on file  Occupational History  . Not on file  Social Needs  . Financial resource strain: Not on file  . Food insecurity:    Worry: Not on file  Inability: Not on file  . Transportation needs:    Medical: Not on file    Non-medical: Not on file  Tobacco Use  . Smoking status: Never Smoker  . Smokeless tobacco: Never Used  Substance and Sexual Activity  . Alcohol use: Yes    Alcohol/week: 0.0 oz    Comment: social drinker  . Drug use: No  . Sexual activity: Yes    Partners: Male    Birth control/protection: Other-see comments    Comment: Tubal Ligation  Lifestyle  . Physical activity:    Days per week: Not on file    Minutes per session: Not on file  . Stress: Not on file  Relationships  . Social connections:    Talks on phone: Not on file    Gets together: Not on file    Attends religious service: Not on file    Active member of club or organization: Not on file    Attends meetings of clubs or organizations: Not on  file    Relationship status: Not on file  Other Topics Concern  . 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 She has good relationship with them.  Assessment:   Musculoskeletal: Strength & Muscle Tone: within normal limits Gait & Station: normal Patient leans: N/A  Psychiatric Specialty Exam: Medication Refill   Anxiety       ROS  Blood pressure 117/75, pulse 80, temperature 98.5 F (36.9 C), temperature source Oral, weight 280 lb 12.8 oz (127.4 kg).Body mass index is 51.36 kg/m.  General Appearance: Casual  Eye Contact:  Fair  Speech:  Clear and Coherent  Volume:  Normal  Mood:  Anxious and Depressed  Affect:  Congruent  Thought Process:  Logical  Orientation:  Full (Time, Place, and Person)  Thought Content:  WDL  Suicidal Thoughts:  No  Homicidal Thoughts:  No  Memory:  Immediate;   Fair  Judgement:  Fair  Insight:  Fair  Psychomotor Activity:  Decreased  Concentration:  Fair  Recall:  [employer] of Knowledge: Fair  Language: Fair  Akathisia:  No  Handed:  Right  AIMS (if indicated):  none  Assets:  Communication Skills Desire for Improvement Physical Health  ADL's:  Intact  Cognition: WNL  Sleep:  6-8   Is the patient at risk to self?  No. Has the patient been a risk to self in the past 6 months?  No. Has the patient been a risk to self within the distant past?  No. Is the patient a risk to others?  No. Has the patient been a risk to others in the past 6 months?  No. Has the patient been a risk to others within the distant past?  No.  Current Medications: Current Outpatient Medications  Medication Sig Dispense Refill  . ARIPiprazole (ABILIFY) 10 MG tablet Take 1 tablet (10 mg total) by mouth 2 (two) times daily. 180 tablet 1  .  butalbital-acetaminophen-caffeine (FIORICET WITH CODEINE) 50-325-40-30 MG capsule Take 1 capsule by mouth every 6 (six) hours as needed for headache. 30 capsule 1  . clonazePAM (KLONOPIN) 0.5 MG tablet Take 1 tablet (0.5 mg total) by mouth at bedtime. 30 tablet 2  . desvenlafaxine (PRISTIQ) 100 MG 24 hr tablet Take 1 tablet (  100 mg total) by mouth daily. 90 tablet 2  . hydroxychloroquine (PLAQUENIL) 200 MG tablet Take 200 mg by mouth 2 (two) times daily.     Marland Kitchen levothyroxine (SYNTHROID, LEVOTHROID) 25 MCG tablet Take 1 tablet (25 mcg total) by mouth daily. And 50 mcg on Sundays 34 tablet 0  . metFORMIN (GLUCOPHAGE-XR) 750 MG 24 hr tablet Take 1 tablet (750 mg total) by mouth daily with breakfast. 30 tablet 2  . MIRENA, 52 MG, 20 MCG/24HR IUD     . prednisoLONE acetate (PRED FORTE) 1 % ophthalmic suspension Place 1 drop into the right eye daily.     Marland Kitchen spironolactone-hydrochlorothiazide (ALDACTAZIDE) 25-25 MG tablet TAKE 1 TABLET DAILY FOR BLOOD PRESSURE. STOP LOSARTAN. 90 tablet 1  . SUMAtriptan (IMITREX) 20 MG/ACT nasal spray Place 1 spray (20 mg total) into the nose every 2 (two) hours as needed for migraine. 3 Inhaler 2  . topiramate (TOPAMAX) 100 MG tablet Take 1 tablet (100 mg total) by mouth daily. 90 tablet 1  . tranexamic acid (LYSTEDA) 650 MG TABS tablet Take 650 mg by mouth as needed (heavey menses).      No current facility-administered medications for this visit.     Medical Decision Making:  Established Problem, Stable/Improving (1), Review of Psycho-Social Stressors (1) and Review of Medication Regimen & Side Effects (2)  Treatment Plan Summary:Medication management     Discussed with patient about her medications treatment risks benefits and alternatives.  Pristiq 100 mg in the morning. Abilify 20 mg - M, W, F and 10 mg on other days- refilled 10mg  BID  Continue Klonopin 0.5 mg by mouth daily at bedtime when necessary.   Topamax 100 mg at bedtime. She will follow-up in 1  month.   More than 50% of the time spent in psychoeducation, counseling and coordination of care.    This note was generated in part or whole with voice recognition software. Voice regonition is usually quite accurate but there are transcription errors that can and very often do occur. I apologize for any typographical errors that were not detected and corrected.   Rainey Pines, MD  03/03/2018, 12:27 PM

## 2018-03-04 ENCOUNTER — Other Ambulatory Visit: Payer: Self-pay | Admitting: Family Medicine

## 2018-03-04 DIAGNOSIS — E8881 Metabolic syndrome: Secondary | ICD-10-CM

## 2018-03-04 NOTE — Telephone Encounter (Signed)
Refill request for general medication.  Metformin   Last office visit 02/04/18   Follow up on 05/07/18

## 2018-03-06 ENCOUNTER — Telehealth: Payer: Self-pay

## 2018-03-06 NOTE — Telephone Encounter (Signed)
received fax requesting that a prior auth be done on the medication aripiprazole 10mg 

## 2018-03-06 NOTE — Telephone Encounter (Signed)
prior Heather Jefferson was done and pending review for this medication

## 2018-03-17 ENCOUNTER — Inpatient Hospital Stay: Payer: Medicare Other

## 2018-03-17 ENCOUNTER — Inpatient Hospital Stay: Payer: Medicare Other | Admitting: Internal Medicine

## 2018-03-17 NOTE — Progress Notes (Deleted)
[facility] OFFICE PROGRESS NOTE  Patient Care Team: Steele Sizer, MD as PCP - General  Cancer Staging No matching staging information was found for the patient.    No history exists.   # CHRONIC IRON DEFICIENCY ANEMIA sec to menstrual blood loss [2014- Dr.Pandit]; NO EGD/colonoscopy  # Menorrhagia- on BCPs;   INTERVAL HISTORY:  Heather Jefferson 47 y.o.  female pleasant patient above history of chronic iron deficiency anemia secondary to menstrual blood loss is here for follow-up.  Patient had a recent mirena inserted by gynecology.  She has not had menstrual.  For the last 3 months.   No blood in stools black stools.  She is not on by mouth iron because of poor tolerance/constipation.  Patient denies any significant shortness of breath at rest. She complains of mild shortness of breath on exertion.  She does complain of mild fatigue.  Denies any blood in stools or black stools.   REVIEW OF SYSTEMS:  A complete 10 point review of system is done which is negative except mentioned above/history of present illness.   PAST MEDICAL HISTORY :  Past Medical History:  Diagnosis Date  . ADHD (attention deficit hyperactivity disorder)   . Allergy   . Anemia   . Anxiety   . Depression   . Diabetes mellitus, type II ([place])   . Fibromyalgia   . Generalized anxiety disorder   . Headache   . HIV infection ([place])   . Hypertension   . Major depressive disorder, recurrent episode, moderate ([place])   . Migraine with acute onset aura   . Persistent disorder of initiating or maintaining sleep   . PTSD (post-traumatic stress disorder)   . Restless leg   . Seizure disorder ([place])   . Thyroid disease   . Vitamin D deficiency     PAST SURGICAL HISTORY :   Past Surgical History:  Procedure Laterality Date  . EYE SURGERY    . TUBAL LIGATION      FAMILY HISTORY :   Family History  Problem Relation Age of Onset  . Hypertension Mother   . Heart attack Mother   . CAD  Mother   . Diabetes Father   . Hypertension Father   . Hypertension Sister   . Anxiety disorder Brother   . Depression Brother     SOCIAL HISTORY:   Social History   Tobacco Use  . Smoking status: Never Smoker  . Smokeless tobacco: Never Used  Substance Use Topics  . Alcohol use: Yes    Alcohol/week: 0.0 oz    Comment: social drinker  . Drug use: No    ALLERGIES:  is allergic to amoxicillin-pot clavulanate and losartan.  MEDICATIONS:  Current Outpatient Medications  Medication Sig Dispense Refill  . ARIPiprazole (ABILIFY) 10 MG tablet Take 1 tablet (10 mg total) by mouth 2 (two) times daily. 180 tablet 1  . butalbital-acetaminophen-caffeine (FIORICET WITH CODEINE) 50-325-40-30 MG capsule Take 1 capsule by mouth every 6 (six) hours as needed for headache. 30 capsule 1  . clonazePAM (KLONOPIN) 0.5 MG tablet Take 1 tablet (0.5 mg total) by mouth at bedtime. 30 tablet 2  . desvenlafaxine (PRISTIQ) 100 MG 24 hr tablet Take 1 tablet (100 mg total) by mouth daily. 90 tablet 2  . hydroxychloroquine (PLAQUENIL) 200 MG tablet Take 200 mg by mouth 2 (two) times daily.     Marland Kitchen levothyroxine (SYNTHROID, LEVOTHROID) 25 MCG tablet Take 1 tablet (25 mcg total) by mouth daily. And 50  mcg on Sundays 34 tablet 0  . metFORMIN (GLUCOPHAGE-XR) 750 MG 24 hr tablet TAKE 1 TABLET BY MOUTH ONCE DAILY WITH BREAKFAST 90 tablet 0  . MIRENA, 52 MG, 20 MCG/24HR IUD     . prednisoLONE acetate (PRED FORTE) 1 % ophthalmic suspension Place 1 drop into the right eye daily.     Marland Kitchen spironolactone-hydrochlorothiazide (ALDACTAZIDE) 25-25 MG tablet TAKE 1 TABLET DAILY FOR BLOOD PRESSURE. STOP LOSARTAN. 90 tablet 1  . SUMAtriptan (IMITREX) 20 MG/ACT nasal spray Place 1 spray (20 mg total) into the nose every 2 (two) hours as needed for migraine. 3 Inhaler 2  . topiramate (TOPAMAX) 100 MG tablet Take 1 tablet (100 mg total) by mouth daily. 90 tablet 1  . tranexamic acid (LYSTEDA) 650 MG TABS tablet Take 650 mg by mouth as  needed (heavey menses).      No current facility-administered medications for this visit.     PHYSICAL EXAMINATION: ECOG PERFORMANCE STATUS: 0 - Asymptomatic  There were no vitals taken for this visit.  There were no vitals filed for this visit.  GENERAL: Well-nourished well-developed; Alert, no distress and comfortable.   Alone.  EYES: no pallor or icterus OROPHARYNX: no thrush or ulceration; good dentition  NECK: supple, no masses felt LYMPH:  no palpable lymphadenopathy in the cervical, axillary or inguinal regions LUNGS: clear to auscultation and  No wheeze or crackles HEART/CVS: regular rate & rhythm and no murmurs; No lower extremity edema ABDOMEN:abdomen soft, non-tender and normal bowel sounds Musculoskeletal:no cyanosis of digits and no clubbing  PSYCH: alert & oriented x 3 with fluent speech NEURO: no focal motor/sensory deficits SKIN:  no rashes or significant lesions  LABORATORY DATA:  I have reviewed the data as listed    Component Value Date/Time   NA 138 05/29/2017 0822   NA 138 03/11/2013 1337   K 3.9 05/29/2017 0822   K 3.8 03/11/2013 1337   CL 103 05/29/2017 0822   CL 100 03/11/2013 1337   CO2 23 05/29/2017 0822   CO2 27 03/11/2013 1337   GLUCOSE 100 (H) 05/29/2017 0822   GLUCOSE 114 (H) 03/11/2013 1337   BUN 9 05/29/2017 0822   BUN 14.3 03/11/2013 1337   CREATININE 0.74 05/29/2017 0822   CREATININE 0.8 03/11/2013 1337   CALCIUM 9.3 05/29/2017 0822   CALCIUM 9.4 03/11/2013 1337   PROT 7.0 05/29/2017 0822   PROT 7.7 03/11/2013 1337   ALBUMIN 4.1 10/18/2016 0901   ALBUMIN 3.3 (L) 03/11/2013 1337   AST 12 05/29/2017 0822   AST 12 03/11/2013 1337   ALT 10 05/29/2017 0822   ALT 12 03/11/2013 1337   ALKPHOS 68 10/18/2016 0901   ALKPHOS 117 03/11/2013 1337   BILITOT 0.3 05/29/2017 0822   BILITOT <0.20 Repeated and Verified 03/11/2013 1337   GFRNONAA 96 05/29/2017 0822   GFRAA 112 05/29/2017 0822    No results found for: SPEP, UPEP  Lab Results   Component Value Date   WBC 7.6 08/23/2017   NEUTROABS 4.7 08/23/2017   HGB 12.9 08/23/2017   HCT 39.1 08/23/2017   MCV 83.1 08/23/2017   PLT 374 08/23/2017      Chemistry      Component Value Date/Time   NA 138 05/29/2017 0822   NA 138 03/11/2013 1337   K 3.9 05/29/2017 0822   K 3.8 03/11/2013 1337   CL 103 05/29/2017 0822   CL 100 03/11/2013 1337   CO2 23 05/29/2017 0822   CO2 27 03/11/2013 1337  BUN 9 05/29/2017 0822   BUN 14.3 03/11/2013 1337   CREATININE 0.74 05/29/2017 0822   CREATININE 0.8 03/11/2013 1337      Component Value Date/Time   CALCIUM 9.3 05/29/2017 0822   CALCIUM 9.4 03/11/2013 1337   ALKPHOS 68 10/18/2016 0901   ALKPHOS 117 03/11/2013 1337   AST 12 05/29/2017 0822   AST 12 03/11/2013 1337   ALT 10 05/29/2017 0822   ALT 12 03/11/2013 1337   BILITOT 0.3 05/29/2017 0822   BILITOT <0.20 Repeated and Verified 03/11/2013 1337       RADIOGRAPHIC STUDIES: I have personally reviewed the radiological images as listed and agreed with the findings in the report. No results found.   ASSESSMENT & PLAN:  No problem-specific Assessment & Plan notes found for this encounter.   No orders of the defined types were placed in this encounter.  All questions were answered. The patient knows to call the clinic with any problems, questions or concerns.      Cammie Sickle, MD 03/17/2018 8:26 AM

## 2018-03-17 NOTE — Assessment & Plan Note (Deleted)
#   Iron deficiency anemia; today hemoglobin 12; but  secondary to heavy menstrual periods. PO intol to iron. On intermittent IV Feraheme. Patient is  symptomatic. Feraheme today.  # Heavy menstrual periods/menorrhagia- on Mirena/no menses for last 3 months.  # follow up in 6 months/Ferrahem/iron studies/cbc. [might not need ferrahem]

## 2018-03-18 NOTE — Telephone Encounter (Signed)
Approved until 03-05-21

## 2018-03-28 ENCOUNTER — Ambulatory Visit: Payer: Medicare Other

## 2018-03-28 ENCOUNTER — Other Ambulatory Visit: Payer: Medicare Other

## 2018-03-28 ENCOUNTER — Ambulatory Visit: Payer: Medicare Other | Admitting: Internal Medicine

## 2018-03-31 ENCOUNTER — Ambulatory Visit (INDEPENDENT_AMBULATORY_CARE_PROVIDER_SITE_OTHER): Payer: BLUE CROSS/BLUE SHIELD | Admitting: Psychiatry

## 2018-03-31 ENCOUNTER — Other Ambulatory Visit: Payer: Self-pay

## 2018-03-31 ENCOUNTER — Encounter: Payer: Self-pay | Admitting: Psychiatry

## 2018-03-31 VITALS — BP 117/81 | HR 82 | Temp 98.7°F | Wt 281.0 lb

## 2018-03-31 DIAGNOSIS — F331 Major depressive disorder, recurrent, moderate: Secondary | ICD-10-CM | POA: Diagnosis not present

## 2018-03-31 MED ORDER — CLONAZEPAM 0.5 MG PO TABS: 0.5000 mg | ORAL_TABLET | Freq: Every day | ORAL | 2 refills | Status: DC

## 2018-03-31 NOTE — Progress Notes (Signed)
BH MD/PA/NP OP Progress Note  03/31/2018 8:56 AM Heather Jefferson  MRN:  [id number]  Subjective:    Patient is a 47 year old female who presented for the follow-up appointment. She reported that she has started noticing improvement in her symptoms after the Abilify dose was increased.  She reported that she takes Abilify 20 mg on alternate days with 10 mg.  She feels that her feeling hopeless tired and does not have crying spells.  She stated that she has more energy.  However she is concerned about her weight as she has gained 1 pound since her last appointment.  She has been trying to exercise and eat healthy.  We discussed about her medications.  She reported that she is willing to continue with Abilify at this time.  She does not have any other side effects.  Patient reported that she does not check her blood sugar on a regular basis as she was given metformin only for prediabetes.  She was not diagnosed with diabetes at this time.  She appears calm and alert during the interview.  She maintained good eye contact.  She denied having any suicidal homicidal ideations or plans.    She takes Klonopin at night to help her sleep.       She reported that her 50 year old daughter is keeping her busy during the summer break.   She appears calm and alert during the interview.    Chief Complaint:  Chief Complaint    Follow-up; Medication Refill     Visit Diagnosis:     ICD-10-CM   1. MDD (major depressive disorder), recurrent episode, moderate ([place]) F33.1     Past Medical History:  Past Medical History:  Diagnosis Date  . ADHD (attention deficit hyperactivity disorder)   . Allergy   . Anemia   . Anxiety   . Depression   . Diabetes mellitus, type II ([place])   . Fibromyalgia   . Generalized anxiety disorder   . Headache   . HIV infection ([place])   . Hypertension   . Major depressive disorder, recurrent episode, moderate ([place])   . Migraine with acute onset aura   . Persistent disorder  of initiating or maintaining sleep   . PTSD (post-traumatic stress disorder)   . Restless leg   . Seizure disorder ([place])   . Thyroid disease   . Vitamin D deficiency     Past Surgical History:  Procedure Laterality Date  . EYE SURGERY    . TUBAL LIGATION     Family History:  Family History  Problem Relation Age of Onset  . Hypertension Mother   . Heart attack Mother   . CAD Mother   . Diabetes Father   . Hypertension Father   . Hypertension Sister   . Anxiety disorder Brother   . Depression Brother    Social History:  Social History   Socioeconomic History  . Marital status: Married    Spouse name: roberto  . Number of children: 2  . Years of education: Not on file  . Highest education level: 10th grade  Occupational History  . Not on file  Social Needs  . Financial resource strain: Not hard at all  . Food insecurity:    Worry: Never true    Inability: Never true  . Transportation needs:    Medical: No    Non-medical: No  Tobacco Use  . Smoking status: Never Smoker  . Smokeless tobacco: Never Used  Substance and Sexual Activity  .  Alcohol use: Yes    Alcohol/week: 0.0 oz    Comment: social drinker  . Drug use: No  . Sexual activity: Yes    Partners: Male    Birth control/protection: Other-see comments    Comment: Tubal Ligation  Lifestyle  . Physical activity:    Days per week: 0 days    Minutes per session: 0 min  . Stress: Very much  Relationships  . Social connections:    Talks on phone: Once a week    Gets together: Once a week    Attends religious service: More than 4 times per year    Active member of club or organization: Yes    Attends meetings of clubs or organizations: More than 4 times per year    Relationship status: Married  Other Topics Concern  . 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 She has good relationship with them.  Assessment:   Musculoskeletal: Strength & Muscle Tone: within normal limits Gait & Station: normal Patient leans: N/A  Psychiatric Specialty Exam: Medication Refill   Anxiety       ROS  Blood pressure 117/81, pulse 82, temperature 98.7 F (37.1 C), temperature source Oral, weight 281 lb (127.5 kg).Body mass index is 51.4 kg/m.  General Appearance: Casual  Eye Contact:  Fair  Speech:  Clear and Coherent  Volume:  Normal  Mood:  Anxious and Depressed  Affect:  Congruent  Thought Process:  Logical  Orientation:  Full (Time, Place, and Person)  Thought Content:  WDL  Suicidal Thoughts:  No  Homicidal Thoughts:  No  Memory:  Immediate;   Fair  Judgement:  Fair  Insight:  Fair  Psychomotor Activity:  Decreased  Concentration:  Fair  Recall:  [employer] of Knowledge: Fair  Language: Fair  Akathisia:  No  Handed:  Right  AIMS (if indicated):  none  Assets:  Communication Skills Desire for Improvement Physical Health  ADL's:  Intact  Cognition: WNL  Sleep:  6-8   Is the patient at risk to self?  No. Has the patient been a risk to self in the past 6 months?  No. Has the patient been a risk to self within the distant past?  No. Is the patient a risk to others?  No. Has the patient been a risk to others in the past 6 months?  No. Has the patient been a risk to others within the distant past?  No.  Current Medications: Current Outpatient Medications  Medication Sig Dispense Refill  . ARIPiprazole (ABILIFY) 10 MG tablet Take 1 tablet (10 mg total) by mouth 2 (two) times daily. 180 tablet 1  . butalbital-acetaminophen-caffeine (FIORICET WITH CODEINE) 50-325-40-30 MG capsule Take 1 capsule by mouth every 6 (six) hours as needed for headache. 30 capsule 1  . clonazePAM (KLONOPIN) 0.5 MG tablet Take 1 tablet (0.5 mg total) by mouth at bedtime. 30 tablet 2  . desvenlafaxine  (PRISTIQ) 100 MG 24 hr tablet Take 1 tablet (100 mg total) by mouth daily. 90 tablet 2  . hydroxychloroquine (PLAQUENIL) 200 MG tablet Take 200 mg by mouth 2 (two) times daily.     Marland Kitchen levothyroxine (SYNTHROID, LEVOTHROID) 25 MCG tablet Take 1 tablet (25 mcg total) by mouth daily. And 50  mcg on Sundays 34 tablet 0  . metFORMIN (GLUCOPHAGE-XR) 750 MG 24 hr tablet TAKE 1 TABLET BY MOUTH ONCE DAILY WITH BREAKFAST 90 tablet 0  . MIRENA, 52 MG, 20 MCG/24HR IUD     . prednisoLONE acetate (PRED FORTE) 1 % ophthalmic suspension Place 1 drop into the right eye daily.     Marland Kitchen spironolactone-hydrochlorothiazide (ALDACTAZIDE) 25-25 MG tablet TAKE 1 TABLET DAILY FOR BLOOD PRESSURE. STOP LOSARTAN. 90 tablet 1  . SUMAtriptan (IMITREX) 20 MG/ACT nasal spray Place 1 spray (20 mg total) into the nose every 2 (two) hours as needed for migraine. 3 Inhaler 2  . topiramate (TOPAMAX) 100 MG tablet Take 1 tablet (100 mg total) by mouth daily. 90 tablet 1  . tranexamic acid (LYSTEDA) 650 MG TABS tablet Take 650 mg by mouth as needed (heavey menses).      No current facility-administered medications for this visit.     Medical Decision Making:  Established Problem, Stable/Improving (1), Review of Psycho-Social Stressors (1) and Review of Medication Regimen & Side Effects (2)  Treatment Plan Summary:Medication management     Discussed with patient about her medications treatment risks benefits and alternatives.  Pristiq 100 mg in the morning. Abilify 20 mg - M, W, F and 10 mg on other days- refilled 10mg  BID  Continue Klonopin 0.5 mg by mouth daily at bedtime when necessary.   Topamax 100 mg at bedtime. Patient has recently refilled all her medications.  She was only given a prescription of Klonopin at this time. She will follow-up in 2 month.   More than 50% of the time spent in psychoeducation, counseling and coordination of care.    This note was generated in part or whole with voice recognition software. Voice  regonition is usually quite accurate but there are transcription errors that can and very often do occur. I apologize for any typographical errors that were not detected and corrected.   Rainey Pines, MD  03/31/2018, 8:56 AM

## 2018-04-04 ENCOUNTER — Inpatient Hospital Stay: Payer: BLUE CROSS/BLUE SHIELD | Attending: Internal Medicine

## 2018-04-04 ENCOUNTER — Inpatient Hospital Stay: Payer: BLUE CROSS/BLUE SHIELD

## 2018-04-04 ENCOUNTER — Encounter: Payer: Self-pay | Admitting: Internal Medicine

## 2018-04-04 ENCOUNTER — Inpatient Hospital Stay (HOSPITAL_BASED_OUTPATIENT_CLINIC_OR_DEPARTMENT_OTHER): Payer: BLUE CROSS/BLUE SHIELD | Admitting: Internal Medicine

## 2018-04-04 ENCOUNTER — Other Ambulatory Visit: Payer: Self-pay

## 2018-04-04 VITALS — BP 121/85 | HR 77 | Resp 16 | Wt 278.0 lb

## 2018-04-04 DIAGNOSIS — Z79899 Other long term (current) drug therapy: Secondary | ICD-10-CM | POA: Diagnosis not present

## 2018-04-04 DIAGNOSIS — D500 Iron deficiency anemia secondary to blood loss (chronic): Secondary | ICD-10-CM | POA: Diagnosis not present

## 2018-04-04 DIAGNOSIS — N920 Excessive and frequent menstruation with regular cycle: Secondary | ICD-10-CM

## 2018-04-04 LAB — COMPREHENSIVE METABOLIC PANEL
ALT: 17 U/L (ref 0–44)
ANION GAP: 11 (ref 5–15)
AST: 23 U/L (ref 15–41)
Albumin: 3.9 g/dL (ref 3.5–5.0)
Alkaline Phosphatase: 90 U/L (ref 38–126)
BILIRUBIN TOTAL: 0.7 mg/dL (ref 0.3–1.2)
BUN: 10 mg/dL (ref 6–20)
CO2: 28 mmol/L (ref 22–32)
Calcium: 9.1 mg/dL (ref 8.9–10.3)
Chloride: 101 mmol/L (ref 98–111)
Creatinine, Ser: 0.74 mg/dL (ref 0.44–1.00)
Glucose, Bld: 124 mg/dL — ABNORMAL HIGH (ref 70–99)
POTASSIUM: 3 mmol/L — AB (ref 3.5–5.1)
Sodium: 140 mmol/L (ref 135–145)
TOTAL PROTEIN: 7.7 g/dL (ref 6.5–8.1)

## 2018-04-04 LAB — CBC WITH DIFFERENTIAL/PLATELET
BASOS ABS: 0.1 10*3/uL (ref 0–0.1)
Basophils Relative: 1 %
Eosinophils Absolute: 0.1 10*3/uL (ref 0–0.7)
Eosinophils Relative: 1 %
HEMATOCRIT: 40.5 % (ref 35.0–47.0)
HEMOGLOBIN: 13.9 g/dL (ref 12.0–16.0)
LYMPHS PCT: 32 %
Lymphs Abs: 2.5 10*3/uL (ref 1.0–3.6)
MCH: 29.9 pg (ref 26.0–34.0)
MCHC: 34.4 g/dL (ref 32.0–36.0)
MCV: 86.9 fL (ref 80.0–100.0)
Monocytes Absolute: 0.5 10*3/uL (ref 0.2–0.9)
Monocytes Relative: 6 %
NEUTROS ABS: 4.6 10*3/uL (ref 1.4–6.5)
NEUTROS PCT: 60 %
Platelets: 297 10*3/uL (ref 150–440)
RBC: 4.66 MIL/uL (ref 3.80–5.20)
RDW: 14.5 % (ref 11.5–14.5)
WBC: 7.7 10*3/uL (ref 3.6–11.0)

## 2018-04-04 LAB — IRON AND TIBC
IRON: 66 ug/dL (ref 28–170)
SATURATION RATIOS: 19 % (ref 10.4–31.8)
TIBC: 355 ug/dL (ref 250–450)
UIBC: 289 ug/dL

## 2018-04-04 LAB — FERRITIN: FERRITIN: 22 ng/mL (ref 11–307)

## 2018-04-04 NOTE — Assessment & Plan Note (Addendum)
#   Iron deficiency anemia; today hemoglobin 12; but  secondary to heavy menstrual periods. PO intol to iron. On intermittent IV Feraheme. Today- 13.9. HOLD Feraheme today.  # Heavy menstrual periods/menorrhagia- on Mirena/no menses for last  6 month.   # follow up in 6 months/Ferrahem/iron studies/cbc. [might not need ferrahem]/if ID is resolved patient could be discharged from the clinic.

## 2018-04-04 NOTE — Progress Notes (Signed)
[facility] OFFICE PROGRESS NOTE  Patient Care Team: Steele Sizer, MD as PCP - General  Cancer Staging No matching staging information was found for the patient.    No history exists.   # CHRONIC IRON DEFICIENCY ANEMIA sec to menstrual blood loss [2014- Dr.Pandit]; NO EGD/colonoscopy  # Menorrhagia- on mirena [feb 2019]   INTERVAL HISTORY:  Heather Jefferson 47 y.o.  female pleasant patient above history of chronic iron deficiency anemia secondary to menstrual blood loss is here for follow-up.  Patient's appetite is good.  Denies any blood in stools or black or stools.  Energy levels are adequate.  Patient has not had any menstrual cycles because of her IUD.   REVIEW OF SYSTEMS:  A complete 10 point review of system is done which is negative except mentioned above/history of present illness.   PAST MEDICAL HISTORY :  Past Medical History:  Diagnosis Date  . ADHD (attention deficit hyperactivity disorder)   . Allergy   . Anemia   . Anxiety   . Depression   . Diabetes mellitus, type II (Heather Jefferson)   . Fibromyalgia   . Generalized anxiety disorder   . Headache   . HIV infection (Heather Jefferson)   . Hypertension   . Major depressive disorder, recurrent episode, moderate (Heather Jefferson)   . Migraine with acute onset aura   . Persistent disorder of initiating or maintaining sleep   . PTSD (post-traumatic stress disorder)   . Restless leg   . Seizure disorder (Heather Jefferson)   . Thyroid disease   . Vitamin D deficiency     PAST SURGICAL HISTORY :   Past Surgical History:  Procedure Laterality Date  . EYE SURGERY    . TUBAL LIGATION      FAMILY HISTORY :   Family History  Problem Relation Age of Onset  . Hypertension Mother   . Heart attack Mother   . CAD Mother   . Diabetes Father   . Hypertension Father   . Hypertension Sister   . Anxiety disorder Brother   . Depression Brother     SOCIAL HISTORY:   Social History   Tobacco Use  . Smoking status: Never Smoker  .  Smokeless tobacco: Never Used  Substance Use Topics  . Alcohol use: Yes    Alcohol/week: 0.0 oz    Comment: social drinker  . Drug use: No    ALLERGIES:  is allergic to amoxicillin-pot clavulanate and losartan.  MEDICATIONS:  Current Outpatient Medications  Medication Sig Dispense Refill  . ARIPiprazole (ABILIFY) 10 MG tablet Take 1 tablet (10 mg total) by mouth 2 (two) times daily. 180 tablet 1  . butalbital-acetaminophen-caffeine (FIORICET WITH CODEINE) 50-325-40-30 MG capsule Take 1 capsule by mouth every 6 (six) hours as needed for headache. 30 capsule 1  . clonazePAM (KLONOPIN) 0.5 MG tablet Take 1 tablet (0.5 mg total) by mouth at bedtime. 30 tablet 2  . desvenlafaxine (PRISTIQ) 100 MG 24 hr tablet Take 1 tablet (100 mg total) by mouth daily. 90 tablet 2  . hydroxychloroquine (PLAQUENIL) 200 MG tablet Take 200 mg by mouth 2 (two) times daily.     Marland Kitchen levothyroxine (SYNTHROID, LEVOTHROID) 25 MCG tablet Take 1 tablet (25 mcg total) by mouth daily. And 50 mcg on Sundays 34 tablet 0  . metFORMIN (GLUCOPHAGE-XR) 750 MG 24 hr tablet TAKE 1 TABLET BY MOUTH ONCE DAILY WITH BREAKFAST 90 tablet 0  . MIRENA, 52 MG, 20 MCG/24HR IUD     . prednisoLONE acetate (  PRED FORTE) 1 % ophthalmic suspension Place 1 drop into the right eye daily.     Marland Kitchen spironolactone-hydrochlorothiazide (ALDACTAZIDE) 25-25 MG tablet TAKE 1 TABLET DAILY FOR BLOOD PRESSURE. STOP LOSARTAN. 90 tablet 1  . SUMAtriptan (IMITREX) 20 MG/ACT nasal spray Place 1 spray (20 mg total) into the nose every 2 (two) hours as needed for migraine. 3 Inhaler 2  . topiramate (TOPAMAX) 100 MG tablet Take 1 tablet (100 mg total) by mouth daily. 90 tablet 1  . tranexamic acid (LYSTEDA) 650 MG TABS tablet Take 650 mg by mouth as needed (heavey menses).      No current facility-administered medications for this visit.     PHYSICAL EXAMINATION: ECOG PERFORMANCE STATUS: 0 - Asymptomatic  BP 121/85 (BP Location: Left Arm, Patient Position: Sitting)    Pulse 77   Resp 16   Wt 278 lb (126.1 kg)   BMI 50.85 kg/m   Filed Weights   04/04/18 1359  Weight: 278 lb (126.1 kg)    GENERAL: Well-nourished well-developed; Alert, no distress and comfortable.   Alone.  EYES: no pallor or icterus OROPHARYNX: no thrush or ulceration; good dentition  NECK: supple, no masses felt LYMPH:  no palpable lymphadenopathy in the cervical, axillary or inguinal regions LUNGS: clear to auscultation and  No wheeze or crackles HEART/CVS: regular rate & rhythm and no murmurs; No lower extremity edema ABDOMEN:abdomen soft, non-tender and normal bowel sounds Musculoskeletal:no cyanosis of digits and no clubbing  PSYCH: alert & oriented x 3 with fluent speech NEURO: no focal motor/sensory deficits SKIN:  no rashes or significant lesions  LABORATORY DATA:  I have reviewed the data as listed    Component Value Date/Time   NA 140 04/04/2018 1329   NA 138 03/11/2013 1337   K 3.0 (L) 04/04/2018 1329   K 3.8 03/11/2013 1337   CL 101 04/04/2018 1329   CL 100 03/11/2013 1337   CO2 28 04/04/2018 1329   CO2 27 03/11/2013 1337   GLUCOSE 124 (H) 04/04/2018 1329   GLUCOSE 114 (H) 03/11/2013 1337   BUN 10 04/04/2018 1329   BUN 14.3 03/11/2013 1337   CREATININE 0.74 04/04/2018 1329   CREATININE 0.74 05/29/2017 0822   CREATININE 0.8 03/11/2013 1337   CALCIUM 9.1 04/04/2018 1329   CALCIUM 9.4 03/11/2013 1337   PROT 7.7 04/04/2018 1329   PROT 7.7 03/11/2013 1337   ALBUMIN 3.9 04/04/2018 1329   ALBUMIN 3.3 (L) 03/11/2013 1337   AST 23 04/04/2018 1329   AST 12 03/11/2013 1337   ALT 17 04/04/2018 1329   ALT 12 03/11/2013 1337   ALKPHOS 90 04/04/2018 1329   ALKPHOS 117 03/11/2013 1337   BILITOT 0.7 04/04/2018 1329   BILITOT <0.20 Repeated and Verified 03/11/2013 1337   GFRNONAA >60 04/04/2018 1329   GFRNONAA 96 05/29/2017 0822   GFRAA >60 04/04/2018 1329   GFRAA 112 05/29/2017 0822    No results found for: SPEP, UPEP  Lab Results  Component Value Date    WBC 7.7 04/04/2018   NEUTROABS 4.6 04/04/2018   HGB 13.9 04/04/2018   HCT 40.5 04/04/2018   MCV 86.9 04/04/2018   PLT 297 04/04/2018      Chemistry      Component Value Date/Time   NA 140 04/04/2018 1329   NA 138 03/11/2013 1337   K 3.0 (L) 04/04/2018 1329   K 3.8 03/11/2013 1337   CL 101 04/04/2018 1329   CL 100 03/11/2013 1337   CO2 28 04/04/2018 1329  CO2 27 03/11/2013 1337   BUN 10 04/04/2018 1329   BUN 14.3 03/11/2013 1337   CREATININE 0.74 04/04/2018 1329   CREATININE 0.74 05/29/2017 0822   CREATININE 0.8 03/11/2013 1337      Component Value Date/Time   CALCIUM 9.1 04/04/2018 1329   CALCIUM 9.4 03/11/2013 1337   ALKPHOS 90 04/04/2018 1329   ALKPHOS 117 03/11/2013 1337   AST 23 04/04/2018 1329   AST 12 03/11/2013 1337   ALT 17 04/04/2018 1329   ALT 12 03/11/2013 1337   BILITOT 0.7 04/04/2018 1329   BILITOT <0.20 Repeated and Verified 03/11/2013 1337       RADIOGRAPHIC STUDIES: I have personally reviewed the radiological images as listed and agreed with the findings in the report. No results found.   ASSESSMENT & PLAN:  Iron deficiency anemia due to chronic blood loss # Iron deficiency anemia; today hemoglobin 12; but  secondary to heavy menstrual periods. PO intol to iron. On intermittent IV Feraheme. Today- 13.9. HOLD Feraheme today.  # Heavy menstrual periods/menorrhagia- on Mirena/no menses for last  6 month.   # follow up in 6 months/Ferrahem/iron studies/cbc. [might not need ferrahem]/if ID is resolved patient could be discharged from the clinic.   Orders Placed This Encounter  Procedures  . CBC with Differential    Standing Status:   Future    Standing Expiration Date:   04/05/2019  . Ferritin    Standing Status:   Future    Standing Expiration Date:   04/05/2019  . Iron and TIBC    Standing Status:   Future    Standing Expiration Date:   04/05/2019   All questions were answered. The patient knows to call the clinic with any problems,  questions or concerns.      Cammie Sickle, MD 04/04/2018 9:57 PM

## 2018-05-07 ENCOUNTER — Encounter: Payer: Self-pay | Admitting: Family Medicine

## 2018-05-07 ENCOUNTER — Ambulatory Visit (INDEPENDENT_AMBULATORY_CARE_PROVIDER_SITE_OTHER): Payer: BLUE CROSS/BLUE SHIELD | Admitting: Family Medicine

## 2018-05-07 VITALS — BP 124/86 | HR 82 | Temp 98.2°F | Resp 16 | Ht 62.0 in | Wt 270.2 lb

## 2018-05-07 DIAGNOSIS — M05742 Rheumatoid arthritis with rheumatoid factor of left hand without organ or systems involvement: Secondary | ICD-10-CM | POA: Diagnosis not present

## 2018-05-07 DIAGNOSIS — F339 Major depressive disorder, recurrent, unspecified: Secondary | ICD-10-CM | POA: Diagnosis not present

## 2018-05-07 DIAGNOSIS — E8881 Metabolic syndrome: Secondary | ICD-10-CM

## 2018-05-07 DIAGNOSIS — I10 Essential (primary) hypertension: Secondary | ICD-10-CM | POA: Diagnosis not present

## 2018-05-07 DIAGNOSIS — K219 Gastro-esophageal reflux disease without esophagitis: Secondary | ICD-10-CM

## 2018-05-07 DIAGNOSIS — E785 Hyperlipidemia, unspecified: Secondary | ICD-10-CM

## 2018-05-07 DIAGNOSIS — R739 Hyperglycemia, unspecified: Secondary | ICD-10-CM | POA: Diagnosis not present

## 2018-05-07 DIAGNOSIS — E876 Hypokalemia: Secondary | ICD-10-CM

## 2018-05-07 DIAGNOSIS — E039 Hypothyroidism, unspecified: Secondary | ICD-10-CM

## 2018-05-07 DIAGNOSIS — G43009 Migraine without aura, not intractable, without status migrainosus: Secondary | ICD-10-CM

## 2018-05-07 DIAGNOSIS — E559 Vitamin D deficiency, unspecified: Secondary | ICD-10-CM

## 2018-05-07 DIAGNOSIS — M05741 Rheumatoid arthritis with rheumatoid factor of right hand without organ or systems involvement: Secondary | ICD-10-CM | POA: Diagnosis not present

## 2018-05-07 MED ORDER — SEMAGLUTIDE (1 MG/DOSE) 2 MG/1.5ML ~~LOC~~ SOPN: 1.0000 mg | PEN_INJECTOR | SUBCUTANEOUS | 2 refills | Status: DC

## 2018-05-07 MED ORDER — SPIRONOLACTONE-HCTZ 25-25 MG PO TABS: ORAL_TABLET | ORAL | 1 refills | Status: DC

## 2018-05-07 MED ORDER — SUMATRIPTAN 20 MG/ACT NA SOLN: 20.0000 mg | NASAL | 2 refills | Status: DC | PRN

## 2018-05-07 MED ORDER — VITAMIN D (ERGOCALCIFEROL) 1.25 MG (50000 UNIT) PO CAPS: 50000.0000 [IU] | ORAL_CAPSULE | ORAL | 0 refills | Status: DC

## 2018-05-07 NOTE — Patient Instructions (Signed)
Gastroesophageal Reflux Disease, Adult Normally, food travels down the esophagus and stays in the stomach to be digested. However, when a person has gastroesophageal reflux disease (GERD), food and stomach acid move back up into the esophagus. When this happens, the esophagus becomes sore and inflamed. Over time, GERD can create small holes (ulcers) in the lining of the esophagus. What are the causes? This condition is caused by a problem with the muscle between the esophagus and the stomach (lower esophageal sphincter, or LES). Normally, the LES muscle closes after food passes through the esophagus to the stomach. When the LES is weakened or abnormal, it does not close properly, and that allows food and stomach acid to go back up into the esophagus. The LES can be weakened by certain dietary substances, medicines, and medical conditions, including:  Tobacco use.  Pregnancy.  Having a hiatal hernia.  Heavy alcohol use.  Certain foods and beverages, such as coffee, chocolate, onions, and peppermint.  What increases the risk? This condition is more likely to develop in:  People who have an increased body weight.  People who have connective tissue disorders.  People who use NSAID medicines.  What are the signs or symptoms? Symptoms of this condition include:  Heartburn.  Difficult or painful swallowing.  The feeling of having a lump in the throat.  Abitter taste in the mouth.  Bad breath.  Having a large amount of saliva.  Having an upset or bloated stomach.  Belching.  Chest pain.  Shortness of breath or wheezing.  Ongoing (chronic) cough or a night-time cough.  Wearing away of tooth enamel.  Weight loss.  Different conditions can cause chest pain. Make sure to see your health care provider if you experience chest pain. How is this diagnosed? Your health care provider will take a medical history and perform a physical exam. To determine if you have mild or severe  GERD, your health care provider may also monitor how you respond to treatment. You may also have other tests, including:  An endoscopy toexamine your stomach and esophagus with a small camera.  A test thatmeasures the acidity level in your esophagus.  A test thatmeasures how much pressure is on your esophagus.  A barium swallow or modified barium swallow to show the shape, size, and functioning of your esophagus.  How is this treated? The goal of treatment is to help relieve your symptoms and to prevent complications. Treatment for this condition may vary depending on how severe your symptoms are. Your health care provider may recommend:  Changes to your diet.  Medicine.  Surgery.  Follow these instructions at home: Diet  Follow a diet as recommended by your health care provider. This may involve avoiding foods and drinks such as: ? Coffee and tea (with or without caffeine). ? Drinks that containalcohol. ? Energy drinks and sports drinks. ? Carbonated drinks or sodas. ? Chocolate and cocoa. ? Peppermint and mint flavorings. ? Garlic and onions. ? Horseradish. ? Spicy and acidic foods, including peppers, chili powder, curry powder, vinegar, hot sauces, and barbecue sauce. ? Citrus fruit juices and citrus fruits, such as oranges, lemons, and limes. ? Tomato-based foods, such as red sauce, chili, salsa, and pizza with red sauce. ? Fried and fatty foods, such as donuts, french fries, potato chips, and high-fat dressings. ? High-fat meats, such as hot dogs and fatty cuts of red and white meats, such as rib eye steak, sausage, ham, and bacon. ? High-fat dairy items, such as whole milk,   butter, and cream cheese.  Eat small, frequent meals instead of large meals.  Avoid drinking large amounts of liquid with your meals.  Avoid eating meals during the 2-3 hours before bedtime.  Avoid lying down right after you eat.  Do not exercise right after you eat. General  instructions  Pay attention to any changes in your symptoms.  Take over-the-counter and prescription medicines only as told by your health care provider. Do not take aspirin, ibuprofen, or other NSAIDs unless your health care provider told you to do so.  Do not use any tobacco products, including cigarettes, chewing tobacco, and e-cigarettes. If you need help quitting, ask your health care provider.  Wear loose-fitting clothing. Do not wear anything tight around your waist that causes pressure on your abdomen.  Raise (elevate) the head of your bed 6 inches (15cm).  Try to reduce your stress, such as with yoga or meditation. If you need help reducing stress, ask your health care provider.  If you are overweight, reduce your weight to an amount that is healthy for you. Ask your health care provider for guidance about a safe weight loss goal.  Keep all follow-up visits as told by your health care provider. This is important. Contact a health care provider if:  You have new symptoms.  You have unexplained weight loss.  You have difficulty swallowing, or it hurts to swallow.  You have wheezing or a persistent cough.  Your symptoms do not improve with treatment.  You have a hoarse voice. Get help right away if:  You have pain in your arms, neck, jaw, teeth, or back.  You feel sweaty, dizzy, or light-headed.  You have chest pain or shortness of breath.  You vomit and your vomit looks like blood or coffee grounds.  You faint.  Your stool is bloody or black.  You cannot swallow, drink, or eat. This information is not intended to replace advice given to you by your health care provider. Make sure you discuss any questions you have with your health care provider. Document Released: 06/20/2005 Document Revised: 02/08/2016 Document Reviewed: 01/05/2015 [employer] Interactive Patient Education  2018 [employer]  
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
## 2018-05-08 LAB — CMP14+EGFR
A/G RATIO: 1.4 (ref 1.2–2.2)
ALBUMIN: 4.3 g/dL (ref 3.5–5.5)
ALT: 20 IU/L (ref 0–32)
AST: 17 IU/L (ref 0–40)
Alkaline Phosphatase: 106 IU/L (ref 39–117)
BUN / CREAT RATIO: 9 (ref 9–23)
BUN: 8 mg/dL (ref 6–24)
Bilirubin Total: 0.4 mg/dL (ref 0.0–1.2)
CALCIUM: 9.9 mg/dL (ref 8.7–10.2)
CO2: 29 mmol/L (ref 20–29)
Chloride: 95 mmol/L — ABNORMAL LOW (ref 96–106)
Creatinine, Ser: 0.88 mg/dL (ref 0.57–1.00)
GFR, EST AFRICAN AMERICAN: 90 mL/min/{1.73_m2} (ref >59)
GFR, EST NON AFRICAN AMERICAN: 78 mL/min/{1.73_m2} (ref >59)
GLOBULIN, TOTAL: 3 g/dL (ref 1.5–4.5)
Glucose: 97 mg/dL (ref 65–99)
POTASSIUM: 2.8 mmol/L — AB (ref 3.5–5.2)
SODIUM: 139 mmol/L (ref 134–144)
Total Protein: 7.3 g/dL (ref 6.0–8.5)

## 2018-05-08 LAB — HEMOGLOBIN A1C
ESTIMATED AVERAGE GLUCOSE: 111 mg/dL
Hgb A1c MFr Bld: 5.5 % (ref 4.8–5.6)

## 2018-05-08 LAB — TSH: TSH: 1.34 u[IU]/mL (ref 0.450–4.500)

## 2018-05-11 ENCOUNTER — Other Ambulatory Visit: Payer: Self-pay | Admitting: Family Medicine

## 2018-05-11 DIAGNOSIS — E876 Hypokalemia: Secondary | ICD-10-CM

## 2018-05-11 MED ORDER — POTASSIUM CHLORIDE CRYS ER 20 MEQ PO TBCR: 20.0000 meq | EXTENDED_RELEASE_TABLET | Freq: Every day | ORAL | 0 refills | Status: DC

## 2018-05-27 ENCOUNTER — Other Ambulatory Visit: Payer: Self-pay

## 2018-05-27 DIAGNOSIS — E876 Hypokalemia: Secondary | ICD-10-CM | POA: Diagnosis not present

## 2018-05-28 ENCOUNTER — Other Ambulatory Visit: Payer: Self-pay | Admitting: Family Medicine

## 2018-05-28 DIAGNOSIS — E8881 Metabolic syndrome: Secondary | ICD-10-CM

## 2018-05-28 DIAGNOSIS — E876 Hypokalemia: Secondary | ICD-10-CM

## 2018-05-28 LAB — MAGNESIUM: Magnesium: 2.4 mg/dL — ABNORMAL HIGH (ref 1.6–2.3)

## 2018-05-28 LAB — POTASSIUM: Potassium: 3.3 mmol/L — ABNORMAL LOW (ref 3.5–5.2)

## 2018-05-28 NOTE — Progress Notes (Unsigned)
po

## 2018-06-02 ENCOUNTER — Encounter: Payer: Self-pay | Admitting: Psychiatry

## 2018-06-02 ENCOUNTER — Ambulatory Visit (INDEPENDENT_AMBULATORY_CARE_PROVIDER_SITE_OTHER): Payer: BLUE CROSS/BLUE SHIELD | Admitting: Psychiatry

## 2018-06-02 ENCOUNTER — Other Ambulatory Visit: Payer: Self-pay

## 2018-06-02 VITALS — BP 142/86 | Temp 97.7°F | Wt 267.0 lb

## 2018-06-02 DIAGNOSIS — F331 Major depressive disorder, recurrent, moderate: Secondary | ICD-10-CM

## 2018-06-02 DIAGNOSIS — F4312 Post-traumatic stress disorder, chronic: Secondary | ICD-10-CM

## 2018-06-02 MED ORDER — DESVENLAFAXINE SUCCINATE ER 100 MG PO TB24: 100.0000 mg | ORAL_TABLET | Freq: Every day | ORAL | 2 refills | Status: DC

## 2018-06-02 MED ORDER — CLONAZEPAM 0.5 MG PO TABS: 0.5000 mg | ORAL_TABLET | Freq: Every day | ORAL | 2 refills | Status: DC

## 2018-06-02 MED ORDER — TOPIRAMATE 100 MG PO TABS: 100.0000 mg | ORAL_TABLET | Freq: Every day | ORAL | 1 refills | Status: DC
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
## 2018-06-16 ENCOUNTER — Other Ambulatory Visit: Payer: Self-pay | Admitting: Family Medicine

## 2018-06-16 DIAGNOSIS — E876 Hypokalemia: Secondary | ICD-10-CM | POA: Diagnosis not present

## 2018-06-17 LAB — POTASSIUM: Potassium: 3.4 mmol/L — ABNORMAL LOW (ref 3.5–5.2)

## 2018-06-18 ENCOUNTER — Other Ambulatory Visit: Payer: Self-pay | Admitting: Family Medicine

## 2018-06-18 MED ORDER — POTASSIUM CHLORIDE CRYS ER 20 MEQ PO TBCR: 20.0000 meq | EXTENDED_RELEASE_TABLET | Freq: Every day | ORAL | 0 refills | Status: DC

## 2018-07-08 ENCOUNTER — Ambulatory Visit (INDEPENDENT_AMBULATORY_CARE_PROVIDER_SITE_OTHER): Payer: Medicare Other | Admitting: Family Medicine

## 2018-07-08 ENCOUNTER — Other Ambulatory Visit (HOSPITAL_COMMUNITY)
Admission: RE | Admit: 2018-07-08 | Discharge: 2018-07-08 | Disposition: A | Discharge status: Home / Self Care | Payer: BLUE CROSS/BLUE SHIELD | Source: Ambulatory Visit | Attending: Family Medicine | Admitting: Family Medicine

## 2018-07-08 ENCOUNTER — Encounter: Payer: Self-pay | Admitting: Family Medicine

## 2018-07-08 VITALS — BP 128/78 | HR 70 | Temp 98.0°F | Resp 18 | Ht 61.75 in | Wt 259.4 lb

## 2018-07-08 DIAGNOSIS — E7800 Pure hypercholesterolemia, unspecified: Secondary | ICD-10-CM

## 2018-07-08 DIAGNOSIS — Z1211 Encounter for screening for malignant neoplasm of colon: Secondary | ICD-10-CM | POA: Diagnosis not present

## 2018-07-08 DIAGNOSIS — Z1231 Encounter for screening mammogram for malignant neoplasm of breast: Secondary | ICD-10-CM

## 2018-07-08 DIAGNOSIS — Z23 Encounter for immunization: Secondary | ICD-10-CM | POA: Diagnosis not present

## 2018-07-08 DIAGNOSIS — Z01419 Encounter for gynecological examination (general) (routine) without abnormal findings: Secondary | ICD-10-CM

## 2018-07-08 DIAGNOSIS — E876 Hypokalemia: Secondary | ICD-10-CM

## 2018-07-08 DIAGNOSIS — Z124 Encounter for screening for malignant neoplasm of cervix: Secondary | ICD-10-CM | POA: Insufficient documentation

## 2018-07-08 DIAGNOSIS — Z1159 Encounter for screening for other viral diseases: Secondary | ICD-10-CM | POA: Diagnosis not present
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
## 2018-07-08 NOTE — Patient Instructions (Signed)
Preventive Care 40-64 Years, Female Preventive care refers to lifestyle choices and visits with your health care provider that can promote health and wellness. What does preventive care include?  A yearly physical exam. This is also called an annual well check.  Dental exams once or twice a year.  Routine eye exams. Ask your health care provider how often you should have your eyes checked.  Personal lifestyle choices, including: ? Daily care of your teeth and gums. ? Regular physical activity. ? Eating a healthy diet. ? Avoiding tobacco and drug use. ? Limiting alcohol use. ? Practicing safe sex. ? Taking low-dose aspirin daily starting at age 58. ? Taking vitamin and mineral supplements as recommended by your health care provider. What happens during an annual well check? The services and screenings done by your health care provider during your annual well check will depend on your age, overall health, lifestyle risk factors, and family history of disease. Counseling Your health care provider may ask you questions about your:  Alcohol use.  Tobacco use.  Drug use.  Emotional well-being.  Home and relationship well-being.  Sexual activity.  Eating habits.  Work and work Statistician.  Method of birth control.  Menstrual cycle.  Pregnancy history.  Screening You may have the following tests or measurements:  Height, weight, and BMI.  Blood pressure.  Lipid and cholesterol levels. These may be checked every 5 years, or more frequently if you are over 81 years old.  Skin check.  Lung cancer screening. You may have this screening every year starting at age 78 if you have a 30-pack-year history of smoking and currently smoke or have quit within the past 15 years.  Fecal occult blood test (FOBT) of the stool. You may have this test every year starting at age 65.  Flexible sigmoidoscopy or colonoscopy. You may have a sigmoidoscopy every 5 years or a colonoscopy  every 10 years starting at age 30.  Hepatitis C blood test.  Hepatitis B blood test.  Sexually transmitted disease (STD) testing.  Diabetes screening. This is done by checking your blood sugar (glucose) after you have not eaten for a while (fasting). You may have this done every 1-3 years.  Mammogram. This may be done every 1-2 years. Talk to your health care provider about when you should start having regular mammograms. This may depend on whether you have a family history of breast cancer.  BRCA-related cancer screening. This may be done if you have a family history of breast, ovarian, tubal, or peritoneal cancers.  Pelvic exam and Pap test. This may be done every 3 years starting at age 80. Starting at age 36, this may be done every 5 years if you have a Pap test in combination with an HPV test.  Bone density scan. This is done to screen for osteoporosis. You may have this scan if you are at high risk for osteoporosis.  Discuss your test results, treatment options, and if necessary, the need for more tests with your health care provider. Vaccines Your health care provider may recommend certain vaccines, such as:  Influenza vaccine. This is recommended every year.  Tetanus, diphtheria, and acellular pertussis (Tdap, Td) vaccine. You may need a Td booster every 10 years.  Varicella vaccine. You may need this if you have not been vaccinated.  Zoster vaccine. You may need this after age 5.  Measles, mumps, and rubella (MMR) vaccine. You may need at least one dose of MMR if you were born in  1958 or later. You may also need a second dose.  Pneumococcal 13-valent conjugate (PCV13) vaccine. You may need this if you have certain conditions and were not previously vaccinated.  Pneumococcal polysaccharide (PPSV23) vaccine. You may need one or two doses if you smoke cigarettes or if you have certain conditions.  Meningococcal vaccine. You may need this if you have certain  conditions.  Hepatitis A vaccine. You may need this if you have certain conditions or if you travel or work in places where you may be exposed to hepatitis A.  Hepatitis B vaccine. You may need this if you have certain conditions or if you travel or work in places where you may be exposed to hepatitis B.  Haemophilus influenzae type b (Hib) vaccine. You may need this if you have certain conditions.  Talk to your health care provider about which screenings and vaccines you need and how often you need them. This information is not intended to replace advice given to you by your health care provider. Make sure you discuss any questions you have with your health care provider. Document Released: 10/07/2015 Document Revised: 05/30/2016 Document Reviewed: 07/12/2015 Elsevier Interactive Patient Education  2018 [employer]  

## 2018-07-09 DIAGNOSIS — Z13820 Encounter for screening for osteoporosis: Secondary | ICD-10-CM | POA: Diagnosis not present

## 2018-07-09 DIAGNOSIS — Z79899 Other long term (current) drug therapy: Secondary | ICD-10-CM | POA: Diagnosis not present

## 2018-07-09 DIAGNOSIS — M797 Fibromyalgia: Secondary | ICD-10-CM | POA: Diagnosis not present

## 2018-07-09 DIAGNOSIS — E876 Hypokalemia: Secondary | ICD-10-CM | POA: Diagnosis not present

## 2018-07-09 DIAGNOSIS — I7300 Raynaud's syndrome without gangrene: Secondary | ICD-10-CM | POA: Diagnosis not present

## 2018-07-09 DIAGNOSIS — M059 Rheumatoid arthritis with rheumatoid factor, unspecified: Secondary | ICD-10-CM | POA: Diagnosis not present

## 2018-07-10 DIAGNOSIS — E876 Hypokalemia: Secondary | ICD-10-CM | POA: Diagnosis not present

## 2018-07-10 DIAGNOSIS — E559 Vitamin D deficiency, unspecified: Secondary | ICD-10-CM | POA: Diagnosis not present

## 2018-07-10 DIAGNOSIS — M797 Fibromyalgia: Secondary | ICD-10-CM | POA: Diagnosis not present

## 2018-07-10 DIAGNOSIS — M059 Rheumatoid arthritis with rheumatoid factor, unspecified: Secondary | ICD-10-CM | POA: Diagnosis not present

## 2018-07-10 DIAGNOSIS — Z13820 Encounter for screening for osteoporosis: Secondary | ICD-10-CM | POA: Diagnosis not present

## 2018-07-10 DIAGNOSIS — Z79899 Other long term (current) drug therapy: Secondary | ICD-10-CM | POA: Diagnosis not present

## 2018-07-10 LAB — CYTOLOGY - PAP
Diagnosis: NEGATIVE
HPV: NOT DETECTED

## 2018-07-14 DIAGNOSIS — Z1159 Encounter for screening for other viral diseases: Secondary | ICD-10-CM | POA: Diagnosis not present

## 2018-07-14 DIAGNOSIS — E876 Hypokalemia: Secondary | ICD-10-CM | POA: Diagnosis not present

## 2018-07-14 DIAGNOSIS — E7800 Pure hypercholesterolemia, unspecified: Secondary | ICD-10-CM | POA: Diagnosis not present

## 2018-07-15 LAB — LIPID PANEL
CHOL/HDL RATIO: 4.7 ratio — AB (ref 0.0–4.4)
Cholesterol, Total: 197 mg/dL (ref 100–199)
HDL: 42 mg/dL (ref >39)
LDL Calculated: 115 mg/dL — ABNORMAL HIGH (ref 0–99)
TRIGLYCERIDES: 201 mg/dL — AB (ref 0–149)
VLDL CHOLESTEROL CAL: 40 mg/dL (ref 5–40)

## 2018-07-15 LAB — HEPATITIS C ANTIBODY: Hep C Virus Ab: <0.1 s/co ratio (ref 0.0–0.9)

## 2018-07-15 LAB — POTASSIUM: Potassium: 3.6 mmol/L (ref 3.5–5.2)

## 2018-07-21 ENCOUNTER — Telehealth: Payer: Self-pay

## 2018-07-21 NOTE — Telephone Encounter (Signed)
pt called states that she was taking 100mg  bid and did not know that it was suppose to be once a day. do you want her to taper down

## 2018-07-28 ENCOUNTER — Other Ambulatory Visit: Payer: Self-pay

## 2018-07-28 ENCOUNTER — Encounter: Payer: Self-pay | Admitting: Psychiatry

## 2018-07-28 ENCOUNTER — Ambulatory Visit (INDEPENDENT_AMBULATORY_CARE_PROVIDER_SITE_OTHER): Payer: BLUE CROSS/BLUE SHIELD | Admitting: Psychiatry

## 2018-07-28 VITALS — BP 125/82 | HR 75 | Temp 97.9°F | Wt 259.2 lb

## 2018-07-28 DIAGNOSIS — F4312 Post-traumatic stress disorder, chronic: Secondary | ICD-10-CM | POA: Diagnosis not present

## 2018-07-28 DIAGNOSIS — F331 Major depressive disorder, recurrent, moderate: Secondary | ICD-10-CM | POA: Diagnosis not present

## 2018-07-28 MED ORDER — DESVENLAFAXINE SUCCINATE ER 100 MG PO TB24: 100.0000 mg | ORAL_TABLET | Freq: Every day | ORAL | 2 refills | Status: DC

## 2018-07-28 MED ORDER — ARIPIPRAZOLE 10 MG PO TABS: 10.0000 mg | ORAL_TABLET | Freq: Two times a day (BID) | ORAL | 1 refills | Status: DC

## 2018-07-28 MED ORDER — TOPIRAMATE 100 MG PO TABS: 100.0000 mg | ORAL_TABLET | Freq: Every day | ORAL | 1 refills | Status: DC

## 2018-07-28 MED ORDER — CLONAZEPAM 0.5 MG PO TABS: 0.5000 mg | ORAL_TABLET | Freq: Every day | ORAL | 2 refills | Status: DC

## 2018-07-28 NOTE — Progress Notes (Signed)
BH MD/PA/NP OP Progress Note  07/28/2018 9:12 AM Heather Jefferson  MRN:  [id number]  Subjective:    Patient is a 47 year old female who presented for the follow-up appointment. She reported that she has been planning family holidays and working with her 67 year old daughter as her birthday is coming up next month.  Patient remains anxious and focused on her daughter who was given for adoption when she was young.  She reported that she is unable to locate her as the social services will not provide her any information.  She was crying about her.  She reported that she thinks about her all the time.  Patient reported that she has already contacted [email or web address] and was unable to contact and has any information about her.  Patient reported that she feels depressed and she has also been concerned about her husband who is currently sick and is also attending his own appointment as he was recently diagnosed with congestive heart failure.  Patient reported that she wants to care for her family and has several family issues.    She remains chronically depressed and has anhedonia.  However she wants to try to improve for her family and her children.  We discussed about trying to make small goals for herself and try to stay active with her family and she agreed with the plan.  She reported that her medications are helpful and she feels motivated most of the time.  She denied having any suicidal homicidal ideations or plans.  She denied having any perceptual disturbances.     Patient remains chronically depressed with tearful during the interview.  She is compliant with her medications.      We discussed about ways to control her anxiety and how to cope with the stress and the PTSD symptoms.  She reported that the medications are helpful and she is able to control her symptoms.  She is also noticing that she is losing weight with the help of the Topamax and her migraine headaches are also under control.   She  was able to provide coherent history during the interview.  No behavioral problems noted at this time.      Chief Complaint:  Chief Complaint    Follow-up; Medication Refill     Visit Diagnosis:     ICD-10-CM   1. MDD (major depressive disorder), recurrent episode, moderate (HCC) F33.1   2. Chronic post-traumatic stress disorder (PTSD) F43.12     Past Medical History:  Past Medical History:  Diagnosis Date  . ADHD (attention deficit hyperactivity disorder)   . Allergy   . Anemia   . Anxiety   . Depression   . Diabetes mellitus, type II ([place])   . Fibromyalgia   . Generalized anxiety disorder   . Headache   . HIV infection ([place])   . Hypertension   . Major depressive disorder, recurrent episode, moderate ([place])   . Migraine with acute onset aura   . Persistent disorder of initiating or maintaining sleep   . PTSD (post-traumatic stress disorder)   . Restless leg   . Seizure disorder ([place])   . Thyroid disease   . Vitamin D deficiency     Past Surgical History:  Procedure Laterality Date  . EYE SURGERY    . TUBAL LIGATION     Family History:  Family History  Problem Relation Age of Onset  . Hypertension Mother   . Heart attack Mother   . CAD Mother   . Diabetes Father   .  Hypertension Father   . Hypertension Sister   . Anxiety disorder Brother   . Depression Brother    Social History:  Social History   Socioeconomic History  . Marital status: Married    Spouse name: roberto  . Number of children: 2  . Years of education: Not on file  . Highest education level: 10th grade  Occupational History  . Occupation: disabled  Social Needs  . Financial resource strain: Not hard at all  . Food insecurity:    Worry: Never true    Inability: Never true  . Transportation needs:    Medical: No    Non-medical: No  Tobacco Use  . Smoking status: Never Smoker  . Smokeless tobacco: Never Used  Substance and Sexual Activity  . Alcohol use: Yes    Alcohol/week: 0.0  standard drinks    Comment: social drinker  . Drug use: No  . Sexual activity: Not Currently    Partners: Male    Birth control/protection: Other-see comments, IUD    Comment: husband has ED  Lifestyle  . Physical activity:    Days per week: 0 days    Minutes per session: 0 min  . Stress: Very much  Relationships  . Social connections:    Talks on phone: Once a week    Gets together: Once a week    Attends religious service: More than 4 times per year    Active member of club or organization: Yes    Attends meetings of clubs or organizations: More than 4 times per year    Relationship status: Married  Other Topics Concern  . 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 She has good relationship with them.  Assessment:   Musculoskeletal: Strength & Muscle Tone: within normal limits Gait & Station: normal Patient leans: N/A  Psychiatric Specialty Exam: Medication Refill   Anxiety       ROS  Blood pressure 125/82, pulse 75, temperature 97.9 F (36.6 C), temperature source Oral, weight 259 lb 3.2 oz (117.6 kg).Body mass index is 47.79 kg/m.  General Appearance: Casual  Eye Contact:  Fair  Speech:  Clear and Coherent  Volume:  Normal  Mood:  Anxious and Depressed  Affect:  Congruent  Thought Process:  Logical  Orientation:  Full (Time, Place, and Person)  Thought Content:  WDL  Suicidal Thoughts:  No  Homicidal Thoughts:  No  Memory:  Immediate;   Fair  Judgement:  Fair  Insight:  Fair  Psychomotor Activity:  Decreased  Concentration:  Fair  Recall:  [employer] of Knowledge: Fair  Language: Fair  Akathisia:  No  Handed:  Right  AIMS (if indicated):  none  Assets:  Communication Skills Desire for Improvement Physical Health  ADL's:  Intact  Cognition:  WNL  Sleep:  6-8   Is the patient at risk to self?  No. Has the patient been a risk to self in the past 6 months?  No. Has the patient been a risk to self within the distant past?  No. Is the patient a risk to others?  No. Has the patient been a risk to others in the past 6 months?  No. Has the patient been a risk to others within the distant past?  No.  Current Medications: Current Outpatient Medications  Medication Sig Dispense Refill  . ARIPiprazole (ABILIFY) 10 MG tablet Take 1 tablet (10 mg total) by mouth 2 (two) times daily. 180 tablet 1  . butalbital-acetaminophen-caffeine (FIORICET WITH CODEINE) 50-325-40-30 MG capsule Take 1 capsule by mouth every 6 (six) hours as needed for headache. 30 capsule 1  . clonazePAM (KLONOPIN) 0.5 MG tablet Take 1 tablet (0.5 mg total) by mouth at bedtime. 30 tablet 2  . desvenlafaxine (PRISTIQ) 100 MG 24 hr tablet Take 1 tablet (100 mg total) by mouth daily. 90 tablet 2  . hydroxychloroquine (PLAQUENIL) 200 MG tablet Take 200 mg by mouth 2 (two) times daily.    Marland Kitchen levothyroxine (SYNTHROID, LEVOTHROID) 25 MCG tablet Take 1 tablet (25 mcg total) by mouth daily. And 50 mcg on Sundays 34 tablet 0  . MIRENA, 52 MG, 20 MCG/24HR IUD     . potassium chloride SA (K-DUR,KLOR-CON) 20 MEQ tablet Take 1 tablet (20 mEq total) by mouth daily. 30 tablet 0  . prednisoLONE acetate (PRED FORTE) 1 % ophthalmic suspension Place 1 drop into the right eye daily.     . Semaglutide (OZEMPIC) 1 MG/DOSE SOPN Inject 1 mg into the skin once a week. 3 mL 2  . spironolactone-hydrochlorothiazide (ALDACTAZIDE) 25-25 MG tablet TAKE 1 TABLET DAILY FOR BLOOD PRESSURE. STOP LOSARTAN. 90 tablet 1  . SUMAtriptan (IMITREX) 20 MG/ACT nasal spray Place 1 spray (20 mg total) into the nose every 2 (two) hours as needed for migraine. 3 Inhaler 2  . topiramate (TOPAMAX) 100 MG tablet Take 1 tablet (100 mg total) by mouth daily. 90 tablet 1  . Vitamin D, Ergocalciferol, (DRISDOL) 50000 units CAPS  capsule Take 1 capsule (50,000 Units total) by mouth every 7 (seven) days. 12 capsule 0   No current facility-administered medications for this visit.     Medical Decision Making:  Established Problem, Stable/Improving (1), Review of Psycho-Social Stressors (1) and Review of Medication Regimen & Side Effects (2)  Treatment Plan Summary:Medication management     Discussed with patient about her medications treatment risks benefits and alternatives.  Pristiq 100 mg in the morning. Abilify 20 mg - M, W, F and 10 mg on other days- refilled 10mg  BID  Continue Klonopin 0.5 mg by mouth daily at bedtime when necessary.   Topamax 100 mg at bedtime. Marland Kitchen She will follow-up in 1 month.   More than 50% of the time spent in psychoeducation, counseling and coordination of care.    This note was generated in part or whole with voice recognition software. Voice regonition is usually quite accurate but there are transcription errors that can and very often do occur. I apologize for any typographical errors that were not detected and corrected.   Rainey Pines, MD  07/28/2018, 9:12 AM

## 2018-08-11 ENCOUNTER — Other Ambulatory Visit: Payer: Self-pay

## 2018-08-11 ENCOUNTER — Ambulatory Visit (INDEPENDENT_AMBULATORY_CARE_PROVIDER_SITE_OTHER): Payer: BLUE CROSS/BLUE SHIELD | Admitting: Psychiatry

## 2018-08-11 ENCOUNTER — Encounter: Payer: Self-pay | Admitting: Psychiatry

## 2018-08-11 VITALS — BP 123/80 | HR 67 | Temp 98.7°F | Wt 259.4 lb

## 2018-08-11 DIAGNOSIS — F4312 Post-traumatic stress disorder, chronic: Secondary | ICD-10-CM

## 2018-08-11 DIAGNOSIS — F331 Major depressive disorder, recurrent, moderate: Secondary | ICD-10-CM | POA: Diagnosis not present

## 2018-08-11 NOTE — Progress Notes (Signed)
BH MD/PA/NP OP Progress Note  08/11/2018 9:29 AM Heather Jefferson  MRN:  [id number]  Subjective:    Patient is a 47 year old female who presented for the follow-up appointment. She reported that she has been planning family holidays and has been talking in detail about her mother who lives in [place] as a family gas.  She reported that she has issues with her mother and her mother does not like her children as well.  She reported that she does not want to invite her mother for the holidays and will not go visit her as well.  Patient was talking in detail about the same.  He reported that her mother always makes negative comments about her children and she does not like the same.  She reported that she is planning to stay home with her family including her husband.  Patient stated that the medications are helping her.  She feels calm and alert.  She was not crying during the interview and has been supportive of her family.  She did not bring up issues about her oldest daughter during this interview.  No other issues noted during the interview.  She is focused on the holidays and appears calm and alert.     Depression and anxiety symptoms are improving .She is compliant with her medications.  Her migraine headache is also getting better.    She is also noticing that she is losing weight with the help of the Topamax and her migraine headaches are also under control.   She was able to provide coherent history during the interview.  No behavioral problems noted at this time.      Chief Complaint:  Chief Complaint    Follow-up; Medication Refill     Visit Diagnosis:   No diagnosis found.  Past Medical History:  Past Medical History:  Diagnosis Date  . ADHD (attention deficit hyperactivity disorder)   . Allergy   . Anemia   . Anxiety   . Depression   . Diabetes mellitus, type II ([place])   . Fibromyalgia   . Generalized anxiety disorder   . Headache   . HIV infection ([place])   .  Hypertension   . Major depressive disorder, recurrent episode, moderate ([place])   . Migraine with acute onset aura   . Persistent disorder of initiating or maintaining sleep   . PTSD (post-traumatic stress disorder)   . Restless leg   . Seizure disorder ([place])   . Thyroid disease   . Vitamin D deficiency     Past Surgical History:  Procedure Laterality Date  . EYE SURGERY    . TUBAL LIGATION     Family History:  Family History  Problem Relation Age of Onset  . Hypertension Mother   . Heart attack Mother   . CAD Mother   . Diabetes Father   . Hypertension Father   . Hypertension Sister   . Anxiety disorder Brother   . Depression Brother    Social History:  Social History   Socioeconomic History  . Marital status: Married    Spouse name: roberto  . Number of children: 2  . Years of education: Not on file  . Highest education level: 10th grade  Occupational History  . Occupation: disabled  Social Needs  . Financial resource strain: Not hard at all  . Food insecurity:    Worry: Never true    Inability: Never true  . Transportation needs:    Medical: No    Non-medical:  No  Tobacco Use  . Smoking status: Never Smoker  . Smokeless tobacco: Never Used  Substance and Sexual Activity  . Alcohol use: Yes    Alcohol/week: 0.0 standard drinks    Comment: social drinker  . Drug use: No  . Sexual activity: Not Currently    Partners: Male    Birth control/protection: Other-see comments, IUD    Comment: husband has ED  Lifestyle  . Physical activity:    Days per week: 0 days    Minutes per session: 0 min  . Stress: Very much  Relationships  . Social connections:    Talks on phone: Once a week    Gets together: Once a week    Attends religious service: More than 4 times per year    Active member of club or organization: Yes    Attends meetings of clubs or organizations: More than 4 times per year    Relationship status: Married  Other Topics Concern  . 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 She has good relationship with them.  Assessment:   Musculoskeletal: Strength & Muscle Tone: within normal limits Gait & Station: normal Patient leans: N/A  Psychiatric Specialty Exam: Medication Refill  Pertinent negatives include no rash.  Anxiety  Symptoms include nervous/anxious behavior.      Review of Systems  Constitutional: Positive for weight loss.  HENT: Negative for tinnitus.   Eyes: Negative for double vision.  Skin: Negative for rash.  Neurological: Negative for tremors.  Endo/Heme/Allergies: Does not bruise/bleed easily.  Psychiatric/Behavioral: Positive for depression. The patient is nervous/anxious.     Blood pressure 123/80, pulse 67, temperature 98.7 F (37.1 C), temperature source Oral, weight 259 lb 6.4 oz (117.7 kg).Body mass index is 47.83 kg/m.  General Appearance: Casual  Eye Contact:  Fair  Speech:  Clear and Coherent  Volume:  Normal  Mood:  Anxious and Depressed  Affect:  Congruent  Thought Process:  Logical  Orientation:  Full (Time, Place, and Person)  Thought Content:  WDL  Suicidal Thoughts:  No  Homicidal Thoughts:  No  Memory:  Immediate;   Fair  Judgement:  Fair  Insight:  Fair  Psychomotor Activity:  Decreased  Concentration:  Fair  Recall:  [employer] of Knowledge: Fair  Language: Fair  Akathisia:  No  Handed:  Right  AIMS (if indicated):  none  Assets:  Communication Skills Desire for Improvement Physical Health  ADL's:  Intact  Cognition: WNL  Sleep:  6-8   Is the patient at risk to self?  No. Has the patient been a risk to self in the past 6 months?  No. Has the patient been a risk to self within the distant past?  No. Is the patient a risk to others?  No. Has the patient been a  risk to others in the past 6 months?  No. Has the patient been a risk to others within the distant past?  No.  Current Medications: Current Outpatient Medications  Medication Sig Dispense Refill  . ARIPiprazole (ABILIFY) 10 MG tablet Take 1 tablet (10 mg total) by mouth 2 (two) times daily. 180 tablet 1  . butalbital-acetaminophen-caffeine (FIORICET WITH CODEINE) 50-325-40-30 MG capsule Take 1 capsule by mouth every 6 (six) hours  as needed for headache. 30 capsule 1  . clonazePAM (KLONOPIN) 0.5 MG tablet Take 1 tablet (0.5 mg total) by mouth at bedtime. 30 tablet 2  . desvenlafaxine (PRISTIQ) 100 MG 24 hr tablet Take 1 tablet (100 mg total) by mouth daily. 90 tablet 2  . hydroxychloroquine (PLAQUENIL) 200 MG tablet Take 200 mg by mouth 2 (two) times daily.    Marland Kitchen levothyroxine (SYNTHROID, LEVOTHROID) 25 MCG tablet Take 1 tablet (25 mcg total) by mouth daily. And 50 mcg on Sundays 34 tablet 0  . MIRENA, 52 MG, 20 MCG/24HR IUD     . potassium chloride SA (K-DUR,KLOR-CON) 20 MEQ tablet Take 1 tablet (20 mEq total) by mouth daily. 30 tablet 0  . prednisoLONE acetate (PRED FORTE) 1 % ophthalmic suspension Place 1 drop into the right eye daily.     . Semaglutide (OZEMPIC) 1 MG/DOSE SOPN Inject 1 mg into the skin once a week. 3 mL 2  . spironolactone-hydrochlorothiazide (ALDACTAZIDE) 25-25 MG tablet TAKE 1 TABLET DAILY FOR BLOOD PRESSURE. STOP LOSARTAN. 90 tablet 1  . SUMAtriptan (IMITREX) 20 MG/ACT nasal spray Place 1 spray (20 mg total) into the nose every 2 (two) hours as needed for migraine. 3 Inhaler 2  . topiramate (TOPAMAX) 100 MG tablet Take 1 tablet (100 mg total) by mouth daily. 90 tablet 1  . Vitamin D, Ergocalciferol, (DRISDOL) 50000 units CAPS capsule Take 1 capsule (50,000 Units total) by mouth every 7 (seven) days. 12 capsule 0   No current facility-administered medications for this visit.     Medical Decision Making:  Established Problem, Stable/Improving (1), Review of Psycho-Social  Stressors (1) and Review of Medication Regimen & Side Effects (2)  Treatment Plan Summary:Medication management     Discussed with patient about her medications treatment risks benefits and alternatives.  Pristiq 100 mg in the morning. Abilify 20 mg - M, W, F and 10 mg on other days- refilled 10mg  BID  Continue Klonopin 0.5 mg by mouth daily at bedtime when necessary.   Topamax 100 mg at bedtime. Marland Kitchen She will follow-up in 1 month.   More than 50% of the time spent in psychoeducation, counseling and coordination of care.    This note was generated in part or whole with voice recognition software. Voice regonition is usually quite accurate but there are transcription errors that can and very often do occur. I apologize for any typographical errors that were not detected and corrected.   Rainey Pines, MD  08/11/2018, 9:29 AM

## 2018-08-18 ENCOUNTER — Other Ambulatory Visit: Payer: Self-pay | Admitting: Psychiatry

## 2018-08-19 DIAGNOSIS — Z79899 Other long term (current) drug therapy: Secondary | ICD-10-CM | POA: Diagnosis not present

## 2018-08-19 DIAGNOSIS — M059 Rheumatoid arthritis with rheumatoid factor, unspecified: Secondary | ICD-10-CM | POA: Diagnosis not present

## 2018-08-19 DIAGNOSIS — M797 Fibromyalgia: Secondary | ICD-10-CM | POA: Diagnosis not present

## 2018-08-19 DIAGNOSIS — E876 Hypokalemia: Secondary | ICD-10-CM | POA: Diagnosis not present

## 2018-08-19 DIAGNOSIS — I7300 Raynaud's syndrome without gangrene: Secondary | ICD-10-CM | POA: Diagnosis not present

## 2018-08-20 ENCOUNTER — Other Ambulatory Visit: Payer: Self-pay | Admitting: Family Medicine

## 2018-08-20 NOTE — Telephone Encounter (Signed)
Refill request for general medication. Potassium to [facility]   Last office visit: 07/08/2018   Follow up on 10/08/2018

## 2018-08-25 ENCOUNTER — Other Ambulatory Visit: Payer: Self-pay | Admitting: Family Medicine

## 2018-08-25 DIAGNOSIS — E8881 Metabolic syndrome: Secondary | ICD-10-CM

## 2018-08-26 ENCOUNTER — Ambulatory Visit: Payer: Medicare Other | Admitting: Psychiatry

## 2018-09-21 ENCOUNTER — Other Ambulatory Visit: Payer: Self-pay | Admitting: Family Medicine

## 2018-09-21 DIAGNOSIS — E8881 Metabolic syndrome: Secondary | ICD-10-CM

## 2018-09-25 DIAGNOSIS — E876 Hypokalemia: Secondary | ICD-10-CM | POA: Diagnosis not present

## 2018-09-25 DIAGNOSIS — I10 Essential (primary) hypertension: Secondary | ICD-10-CM | POA: Diagnosis not present

## 2018-09-30 DIAGNOSIS — S51831A Puncture wound without foreign body of right forearm, initial encounter: Secondary | ICD-10-CM | POA: Diagnosis not present

## 2018-09-30 DIAGNOSIS — F329 Major depressive disorder, single episode, unspecified: Secondary | ICD-10-CM | POA: Diagnosis not present

## 2018-09-30 DIAGNOSIS — F1099 Alcohol use, unspecified with unspecified alcohol-induced disorder: Secondary | ICD-10-CM | POA: Diagnosis not present

## 2018-09-30 DIAGNOSIS — I10 Essential (primary) hypertension: Secondary | ICD-10-CM | POA: Diagnosis not present

## 2018-09-30 DIAGNOSIS — M069 Rheumatoid arthritis, unspecified: Secondary | ICD-10-CM | POA: Diagnosis not present

## 2018-10-01 ENCOUNTER — Inpatient Hospital Stay: Payer: BLUE CROSS/BLUE SHIELD

## 2018-10-03 ENCOUNTER — Ambulatory Visit: Payer: BLUE CROSS/BLUE SHIELD

## 2018-10-03 ENCOUNTER — Other Ambulatory Visit: Payer: BLUE CROSS/BLUE SHIELD

## 2018-10-03 ENCOUNTER — Ambulatory Visit: Payer: BLUE CROSS/BLUE SHIELD | Admitting: Internal Medicine

## 2018-10-06 ENCOUNTER — Other Ambulatory Visit: Payer: Self-pay

## 2018-10-06 DIAGNOSIS — E039 Hypothyroidism, unspecified: Secondary | ICD-10-CM

## 2018-10-06 MED ORDER — LEVOTHYROXINE SODIUM 25 MCG PO TABS: 25.0000 ug | ORAL_TABLET | Freq: Every day | ORAL | 1 refills | Status: DC

## 2018-10-06 NOTE — Telephone Encounter (Signed)
Refill request for thyroid medication: Levothyroxine 25 mcg  Last Physical: 07/08/18  Lab Results  Component Value Date   TSH 1.340 05/07/2018    Follow-ups on file. 10/08/2018

## 2018-10-08 ENCOUNTER — Encounter: Payer: Self-pay | Admitting: Family Medicine

## 2018-10-08 ENCOUNTER — Ambulatory Visit
Admission: RE | Admit: 2018-10-08 | Discharge: 2018-10-08 | Disposition: A | Discharge status: Home / Self Care | Payer: BLUE CROSS/BLUE SHIELD | Source: Ambulatory Visit | Attending: Family Medicine | Admitting: Family Medicine

## 2018-10-08 ENCOUNTER — Ambulatory Visit (INDEPENDENT_AMBULATORY_CARE_PROVIDER_SITE_OTHER): Payer: BLUE CROSS/BLUE SHIELD | Admitting: Family Medicine

## 2018-10-08 DIAGNOSIS — E559 Vitamin D deficiency, unspecified: Secondary | ICD-10-CM

## 2018-10-08 DIAGNOSIS — F331 Major depressive disorder, recurrent, moderate: Secondary | ICD-10-CM | POA: Diagnosis not present

## 2018-10-08 DIAGNOSIS — Z1231 Encounter for screening mammogram for malignant neoplasm of breast: Secondary | ICD-10-CM

## 2018-10-08 DIAGNOSIS — M05741 Rheumatoid arthritis with rheumatoid factor of right hand without organ or systems involvement: Secondary | ICD-10-CM | POA: Diagnosis not present

## 2018-10-08 DIAGNOSIS — E8881 Metabolic syndrome: Secondary | ICD-10-CM | POA: Diagnosis not present

## 2018-10-08 DIAGNOSIS — G43009 Migraine without aura, not intractable, without status migrainosus: Secondary | ICD-10-CM

## 2018-10-08 DIAGNOSIS — E876 Hypokalemia: Secondary | ICD-10-CM | POA: Diagnosis not present

## 2018-10-08 DIAGNOSIS — M05742 Rheumatoid arthritis with rheumatoid factor of left hand without organ or systems involvement: Secondary | ICD-10-CM

## 2018-10-08 DIAGNOSIS — E785 Hyperlipidemia, unspecified: Secondary | ICD-10-CM

## 2018-10-08 DIAGNOSIS — I10 Essential (primary) hypertension: Secondary | ICD-10-CM

## 2018-10-08 DIAGNOSIS — Z23 Encounter for immunization: Secondary | ICD-10-CM | POA: Diagnosis not present

## 2018-10-08 MED ORDER — VITAMIN D (ERGOCALCIFEROL) 1.25 MG (50000 UNIT) PO CAPS: 50000.0000 [IU] | ORAL_CAPSULE | ORAL | 1 refills | Status: DC

## 2018-10-08 MED ORDER — SUMATRIPTAN 20 MG/ACT NA SOLN: 20.0000 mg | NASAL | 2 refills | Status: DC | PRN

## 2018-10-08 MED ORDER — BUTALBITAL-APAP-CAFF-COD 50-325-40-30 MG PO CAPS: 1.0000 | ORAL_CAPSULE | Freq: Four times a day (QID) | ORAL | 1 refills | Status: DC | PRN
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
## 2018-10-09 ENCOUNTER — Other Ambulatory Visit: Payer: Self-pay

## 2018-10-09 DIAGNOSIS — E039 Hypothyroidism, unspecified: Secondary | ICD-10-CM

## 2018-10-09 DIAGNOSIS — G43009 Migraine without aura, not intractable, without status migrainosus: Secondary | ICD-10-CM

## 2018-10-09 MED ORDER — BUTALBITAL-APAP-CAFF-COD 50-325-40-30 MG PO CAPS: 1.0000 | ORAL_CAPSULE | Freq: Four times a day (QID) | ORAL | 1 refills | Status: DC | PRN

## 2018-10-09 MED ORDER — LEVOTHYROXINE SODIUM 25 MCG PO TABS: 25.0000 ug | ORAL_TABLET | Freq: Every day | ORAL | 1 refills | Status: DC

## 2018-10-09 NOTE — Telephone Encounter (Signed)
Copied from [place] [id number]. Topic: General - Other >> Oct 09, 2018  9:58 AM Antonieta Iba C wrote: Reason for CRM: pt says that Rx's butalbital-acetaminophen-caffeine (FIORICET WITH CODEINE) 50-325-40-30 MG capsule and also levothyroxine (SYNTHROID, LEVOTHROID) 25 MCG tablet both should have went to her local [place] ([place]), [place] [address] [place] [address] [phone] (Phone) [phone] (Fax)  Pt says that she is not currently using [facility]. Pt says that pharmacy states that they have not received either Rx. Pt would like to have Rx's resent.

## 2018-10-13 ENCOUNTER — Other Ambulatory Visit: Payer: Self-pay

## 2018-10-13 DIAGNOSIS — G43009 Migraine without aura, not intractable, without status migrainosus: Secondary | ICD-10-CM

## 2018-10-13 DIAGNOSIS — E039 Hypothyroidism, unspecified: Secondary | ICD-10-CM

## 2018-10-13 NOTE — Telephone Encounter (Addendum)
Prior Authorization for Butalbi-Caff-Acetaminophen-Codeine was initiated and approved. It will be approved from 10/10/2018-09/24/2019.    Refill request for thyroid medication: Levothyroxine 25 mcg  Last Physical: 07/08/2018   Lab Results  Component Value Date   TSH 1.340 05/07/2018    Follow-ups on file. 01/14/2019

## 2018-10-15 ENCOUNTER — Inpatient Hospital Stay: Payer: BLUE CROSS/BLUE SHIELD | Attending: Internal Medicine

## 2018-10-15 DIAGNOSIS — Z79899 Other long term (current) drug therapy: Secondary | ICD-10-CM | POA: Insufficient documentation

## 2018-10-15 DIAGNOSIS — N920 Excessive and frequent menstruation with regular cycle: Secondary | ICD-10-CM | POA: Insufficient documentation

## 2018-10-15 DIAGNOSIS — D500 Iron deficiency anemia secondary to blood loss (chronic): Secondary | ICD-10-CM | POA: Diagnosis not present

## 2018-10-15 LAB — CBC WITH DIFFERENTIAL/PLATELET
Abs Immature Granulocytes: 0.01 10*3/uL (ref 0.00–0.07)
Basophils Absolute: 0 10*3/uL (ref 0.0–0.1)
Basophils Relative: 1 %
EOS PCT: 1 %
Eosinophils Absolute: 0.1 10*3/uL (ref 0.0–0.5)
HCT: 40.5 % (ref 36.0–46.0)
Hemoglobin: 13.4 g/dL (ref 12.0–15.0)
Immature Granulocytes: 0 %
Lymphocytes Relative: 36 %
Lymphs Abs: 2.1 10*3/uL (ref 0.7–4.0)
MCH: 29.5 pg (ref 26.0–34.0)
MCHC: 33.1 g/dL (ref 30.0–36.0)
MCV: 89 fL (ref 80.0–100.0)
Monocytes Absolute: 0.3 10*3/uL (ref 0.1–1.0)
Monocytes Relative: 5 %
NEUTROS ABS: 3.3 10*3/uL (ref 1.7–7.7)
Neutrophils Relative %: 57 %
Platelets: 249 10*3/uL (ref 150–400)
RBC: 4.55 MIL/uL (ref 3.87–5.11)
RDW: 14.3 % (ref 11.5–15.5)
WBC: 5.8 10*3/uL (ref 4.0–10.5)
nRBC: 0 % (ref 0.0–0.2)

## 2018-10-15 LAB — IRON AND TIBC
Iron: 77 ug/dL (ref 28–170)
Saturation Ratios: 24 % (ref 10.4–31.8)
TIBC: 327 ug/dL (ref 250–450)
UIBC: 250 ug/dL

## 2018-10-15 LAB — FERRITIN: Ferritin: 20 ng/mL (ref 11–307)

## 2018-10-16 ENCOUNTER — Encounter: Payer: Self-pay | Admitting: Internal Medicine

## 2018-10-16 ENCOUNTER — Inpatient Hospital Stay: Payer: BLUE CROSS/BLUE SHIELD

## 2018-10-16 ENCOUNTER — Inpatient Hospital Stay (HOSPITAL_BASED_OUTPATIENT_CLINIC_OR_DEPARTMENT_OTHER): Payer: BLUE CROSS/BLUE SHIELD | Admitting: Internal Medicine

## 2018-10-16 ENCOUNTER — Other Ambulatory Visit: Payer: Self-pay

## 2018-10-16 VITALS — BP 137/80 | HR 72 | Temp 98.2°F | Resp 20 | Ht 62.0 in | Wt 264.0 lb

## 2018-10-16 DIAGNOSIS — D500 Iron deficiency anemia secondary to blood loss (chronic): Secondary | ICD-10-CM

## 2018-10-16 DIAGNOSIS — Z79899 Other long term (current) drug therapy: Secondary | ICD-10-CM

## 2018-10-16 DIAGNOSIS — N920 Excessive and frequent menstruation with regular cycle: Secondary | ICD-10-CM

## 2018-10-16 NOTE — Assessment & Plan Note (Addendum)
#   Iron deficiency anemia- ; today hemoglobin 12; but  secondary to heavy menstrual periods. PO intol to iron. On intermittent IV Feraheme. Today- 13.9. HOLD Feraheme today.  # Heavy menstrual periods/menorrhagia- on Mirena/no menses for last  6 month.   # Faigue- ? Medication indcued. Sleep stud/fer tp PCP.   # DISPOSITION: # NO infusion today.  #Follow up as needed-dr.B  Cc Dr.Sowles

## 2018-10-16 NOTE — Progress Notes (Signed)
[facility] OFFICE PROGRESS NOTE  Patient Care Team: Steele Sizer, MD as PCP - General  Cancer Staging No matching staging information was found for the patient.    No history exists.   # CHRONIC IRON DEFICIENCY ANEMIA sec to menstrual blood loss [2014- Dr.Pandit]; NO EGD/colonoscopy  # Menorrhagia- on mirena [feb 2019]   INTERVAL HISTORY:  Heather Jefferson 48 y.o.  female history of iron deficiency is here for follow-up.  Patient denies any blood in stools or black or stools.  Denies any significant fatigue.  Menstrual cycles/flow better controlled at this time.    Review of Systems  Constitutional: Negative for chills, diaphoresis, fever, malaise/fatigue and weight loss.  HENT: Negative for nosebleeds and sore throat.   Eyes: Negative for double vision.  Respiratory: Negative for cough, hemoptysis, sputum production, shortness of breath and wheezing.   Cardiovascular: Negative for chest pain, palpitations, orthopnea and leg swelling.  Gastrointestinal: Negative for abdominal pain, blood in stool, constipation, diarrhea, heartburn, melena, nausea and vomiting.  Genitourinary: Negative for dysuria, frequency and urgency.  Musculoskeletal: Negative for back pain and joint pain.  Skin: Negative.  Negative for itching and rash.  Neurological: Negative for dizziness, tingling, focal weakness, weakness and headaches.  Endo/Heme/Allergies: Does not bruise/bleed easily.  Psychiatric/Behavioral: Negative for depression. The patient is not nervous/anxious and does not have insomnia.      PAST MEDICAL HISTORY :  Past Medical History:  Diagnosis Date  . ADHD (attention deficit hyperactivity disorder)   . Allergy   . Anemia   . Anxiety   . Depression   . Diabetes mellitus, type II ([place])   . Fibromyalgia   . Generalized anxiety disorder   . Headache   . HIV infection ([place])   . Hypertension   . Major depressive disorder, recurrent episode, moderate ([place])    . Migraine with acute onset aura   . Persistent disorder of initiating or maintaining sleep   . PTSD (post-traumatic stress disorder)   . Restless leg   . Seizure disorder ([place])   . Thyroid disease   . Vitamin D deficiency     PAST SURGICAL HISTORY :   Past Surgical History:  Procedure Laterality Date  . EYE SURGERY    . TUBAL LIGATION      FAMILY HISTORY :   Family History  Problem Relation Age of Onset  . Hypertension Mother   . Heart attack Mother   . CAD Mother   . Diabetes Father   . Hypertension Father   . Hypertension Sister   . Anxiety disorder Brother   . Depression Brother     SOCIAL HISTORY:   Social History   Tobacco Use  . Smoking status: Never Smoker  . Smokeless tobacco: Never Used  Substance Use Topics  . Alcohol use: Yes    Alcohol/week: 0.0 standard drinks    Comment: social drinker  . Drug use: No    ALLERGIES:  is allergic to amoxicillin-pot clavulanate; losartan; and metformin and related.  MEDICATIONS:  Current Outpatient Medications  Medication Sig Dispense Refill  . amLODipine (NORVASC) 2.5 MG tablet Take 2.5 mg by mouth daily.    . ARIPiprazole (ABILIFY) 10 MG tablet Take 1 tablet (10 mg total) by mouth 2 (two) times daily. 180 tablet 1  . butalbital-acetaminophen-caffeine (FIORICET WITH CODEINE) 50-325-40-30 MG capsule Take 1 capsule by mouth every 6 (six) hours as needed for headache. 30 capsule 1  . clonazePAM (KLONOPIN) 0.5 MG tablet Take 1 tablet (  0.5 mg total) by mouth at bedtime. 30 tablet 2  . desvenlafaxine (PRISTIQ) 100 MG 24 hr tablet Take 1 tablet (100 mg total) by mouth daily. 90 tablet 2  . hydroxychloroquine (PLAQUENIL) 200 MG tablet Take 200 mg by mouth 2 (two) times daily.    Marland Kitchen levothyroxine (SYNTHROID, LEVOTHROID) 25 MCG tablet Take 1 tablet (25 mcg total) by mouth daily. And 50 mcg on Sundays 102 tablet 1  . MIRENA, 52 MG, 20 MCG/24HR IUD     . prednisoLONE acetate (PRED FORTE) 1 % ophthalmic suspension Place 1 drop  into the right eye daily.     . SUMAtriptan (IMITREX) 20 MG/ACT nasal spray Place 1 spray (20 mg total) into the nose every 2 (two) hours as needed for migraine. 3 Inhaler 2  . topiramate (TOPAMAX) 100 MG tablet Take 1 tablet (100 mg total) by mouth daily. 90 tablet 1  . Vitamin D, Ergocalciferol, (DRISDOL) 1.25 MG (50000 UT) CAPS capsule Take 1 capsule (50,000 Units total) by mouth every 7 (seven) days. 12 capsule 1  . OZEMPIC, 1 MG/DOSE, 2 MG/1.5ML SOPN INJECT 1MG  SUBCUTANEOUSLY ONCE A WEEK. 3 mL 0   No current facility-administered medications for this visit.     PHYSICAL EXAMINATION: ECOG PERFORMANCE STATUS: 0 - Asymptomatic  BP 137/80 (BP Location: Left Arm, Patient Position: Sitting)   Pulse 72   Temp 98.2 F (36.8 C) (Tympanic)   Resp 20   Ht 5\' 2"  (1.575 m)   Wt 264 lb (119.7 kg)   BMI 48.29 kg/m   Filed Weights   10/16/18 1035  Weight: 264 lb (119.7 kg)    Physical Exam  Constitutional: She is oriented to person, place, and time and well-developed, well-nourished, and in no distress.  HENT:  Head: Normocephalic and atraumatic.  Mouth/Throat: Oropharynx is clear and moist. No oropharyngeal exudate.  Eyes: Pupils are equal, round, and reactive to light.  Neck: Normal range of motion. Neck supple.  Cardiovascular: Normal rate and regular rhythm.  Pulmonary/Chest: No respiratory distress. She has no wheezes.  Abdominal: Soft. Bowel sounds are normal. She exhibits no distension and no mass. There is no abdominal tenderness. There is no rebound and no guarding.  Musculoskeletal: Normal range of motion.        General: No tenderness or edema.  Neurological: She is alert and oriented to person, place, and time.  Skin: Skin is warm.  Psychiatric: Affect normal.     LABORATORY DATA:  I have reviewed the data as listed    Component Value Date/Time   NA 139 05/07/2018 0955   NA 138 03/11/2013 1337   K 3.6 07/14/2018 0924   K 3.8 03/11/2013 1337   CL 95 (L) 05/07/2018  0955   CL 100 03/11/2013 1337   CO2 29 05/07/2018 0955   CO2 27 03/11/2013 1337   GLUCOSE 97 05/07/2018 0955   GLUCOSE 124 (H) 04/04/2018 1329   GLUCOSE 114 (H) 03/11/2013 1337   BUN 8 05/07/2018 0955   BUN 14.3 03/11/2013 1337   CREATININE 0.88 05/07/2018 0955   CREATININE 0.74 05/29/2017 0822   CREATININE 0.8 03/11/2013 1337   CALCIUM 9.9 05/07/2018 0955   CALCIUM 9.4 03/11/2013 1337   PROT 7.3 05/07/2018 0955   PROT 7.7 03/11/2013 1337   ALBUMIN 4.3 05/07/2018 0955   ALBUMIN 3.3 (L) 03/11/2013 1337   AST 17 05/07/2018 0955   AST 12 03/11/2013 1337   ALT 20 05/07/2018 0955   ALT 12 03/11/2013 1337   ALKPHOS  106 05/07/2018 0955   ALKPHOS 117 03/11/2013 1337   BILITOT 0.4 05/07/2018 0955   BILITOT <0.20 Repeated and Verified 03/11/2013 1337   GFRNONAA 78 05/07/2018 0955   GFRNONAA 96 05/29/2017 0822   GFRAA 90 05/07/2018 0955   GFRAA 112 05/29/2017 0822    No results found for: SPEP, UPEP  Lab Results  Component Value Date   WBC 5.8 10/15/2018   NEUTROABS 3.3 10/15/2018   HGB 13.4 10/15/2018   HCT 40.5 10/15/2018   MCV 89.0 10/15/2018   PLT 249 10/15/2018      Chemistry      Component Value Date/Time   NA 139 05/07/2018 0955   NA 138 03/11/2013 1337   K 3.6 07/14/2018 0924   K 3.8 03/11/2013 1337   CL 95 (L) 05/07/2018 0955   CL 100 03/11/2013 1337   CO2 29 05/07/2018 0955   CO2 27 03/11/2013 1337   BUN 8 05/07/2018 0955   BUN 14.3 03/11/2013 1337   CREATININE 0.88 05/07/2018 0955   CREATININE 0.74 05/29/2017 0822   CREATININE 0.8 03/11/2013 1337      Component Value Date/Time   CALCIUM 9.9 05/07/2018 0955   CALCIUM 9.4 03/11/2013 1337   ALKPHOS 106 05/07/2018 0955   ALKPHOS 117 03/11/2013 1337   AST 17 05/07/2018 0955   AST 12 03/11/2013 1337   ALT 20 05/07/2018 0955   ALT 12 03/11/2013 1337   BILITOT 0.4 05/07/2018 0955   BILITOT <0.20 Repeated and Verified 03/11/2013 1337       RADIOGRAPHIC STUDIES: I have personally reviewed the  radiological images as listed and agreed with the findings in the report. No results found.   ASSESSMENT & PLAN:  Iron deficiency anemia due to chronic blood loss # Iron deficiency anemia- ; today hemoglobin 12; but  secondary to heavy menstrual periods. PO intol to iron. On intermittent IV Feraheme. Today- 13.9. HOLD Feraheme today.  # Heavy menstrual periods/menorrhagia- on Mirena/no menses for last  6 month.   # Faigue- ? Medication indcued. Sleep stud/fer tp PCP.   # DISPOSITION: # NO infusion today.  #Follow up as needed-dr.B  Cc Dr.Sowles   No orders of the defined types were placed in this encounter.  All questions were answered. The patient knows to call the clinic with any problems, questions or concerns.      Cammie Sickle, MD 11/16/2018 6:18 PM

## 2018-10-17 DIAGNOSIS — E876 Hypokalemia: Secondary | ICD-10-CM | POA: Diagnosis not present

## 2018-10-17 DIAGNOSIS — I10 Essential (primary) hypertension: Secondary | ICD-10-CM | POA: Diagnosis not present

## 2018-10-19 ENCOUNTER — Other Ambulatory Visit: Payer: Self-pay | Admitting: Family Medicine

## 2018-10-19 DIAGNOSIS — E8881 Metabolic syndrome: Secondary | ICD-10-CM

## 2018-11-07 ENCOUNTER — Ambulatory Visit: Payer: Self-pay | Admitting: Family Medicine

## 2018-11-10 ENCOUNTER — Ambulatory Visit: Payer: Medicare Other | Admitting: Psychiatry

## 2018-11-16 ENCOUNTER — Other Ambulatory Visit: Payer: Self-pay | Admitting: Family Medicine

## 2018-11-16 DIAGNOSIS — E8881 Metabolic syndrome: Secondary | ICD-10-CM

## 2018-11-17 ENCOUNTER — Ambulatory Visit: Payer: Medicare Other | Admitting: Psychiatry

## 2018-11-24 ENCOUNTER — Other Ambulatory Visit: Payer: Self-pay

## 2018-11-24 ENCOUNTER — Encounter: Payer: Self-pay | Admitting: Psychiatry

## 2018-11-24 ENCOUNTER — Ambulatory Visit (INDEPENDENT_AMBULATORY_CARE_PROVIDER_SITE_OTHER): Payer: BLUE CROSS/BLUE SHIELD | Admitting: Psychiatry

## 2018-11-24 VITALS — BP 140/87 | HR 65 | Temp 99.0°F | Wt 261.2 lb

## 2018-11-24 DIAGNOSIS — F3160 Bipolar disorder, current episode mixed, unspecified: Secondary | ICD-10-CM | POA: Diagnosis not present

## 2018-11-24 MED ORDER — CLONAZEPAM 0.5 MG PO TABS: 0.5000 mg | ORAL_TABLET | Freq: Every day | ORAL | 2 refills | Status: DC

## 2018-11-24 MED ORDER — DESVENLAFAXINE SUCCINATE ER 100 MG PO TB24: 100.0000 mg | ORAL_TABLET | Freq: Every day | ORAL | 2 refills | Status: DC

## 2018-11-24 MED ORDER — ARIPIPRAZOLE 20 MG PO TABS: 20.0000 mg | ORAL_TABLET | Freq: Every day | ORAL | 1 refills | Status: DC

## 2018-11-24 MED ORDER — TOPIRAMATE 100 MG PO TABS: 100.0000 mg | ORAL_TABLET | Freq: Every day | ORAL | 1 refills | Status: DC

## 2018-11-24 NOTE — Progress Notes (Signed)
BH MD/PA/NP OP Progress Note  11/24/2018 9:42 AM Heather Jefferson  MRN:  [id number]  Subjective:    Patient is a 48 year old female who presented for the follow-up appointment. She reported that her father passed away in 10/20/22.  She reported that she is recuperating from his death and has been medicating with her family members.  She reported that her mother is also sick.  Patient reported that she has good communication with her family as it has been helpful.  Patient reported that she has been taking Abilify 20 mg at night and it has been helpful with her mood symptoms and she is not crying on a daily basis.  Patient reported that she has been feeling that her depressive symptoms are improving and she does not feel negative about herself all the time.  She appeared calm and alert during the interview and was not as tearful.  She denied having any suicidal ideations or plans.  We discussed at length about her medications.  She reported that her weight is not stable as she is also started on medications for her rheumatoid arthritis.  She is compliant with her medications.  She denied having any suicidal homicidal ideations or plans.      Chief Complaint:  Chief Complaint    Follow-up; Medication Refill     Visit Diagnosis:     ICD-10-CM   1. Mixed bipolar I disorder (HCC) F31.60     Past Medical History:  Past Medical History:  Diagnosis Date  . ADHD (attention deficit hyperactivity disorder)   . Allergy   . Anemia   . Anxiety   . Depression   . Diabetes mellitus, type II ([place])   . Fibromyalgia   . Generalized anxiety disorder   . Headache   . HIV infection ([place])   . Hypertension   . Major depressive disorder, recurrent episode, moderate ([place])   . Migraine with acute onset aura   . Persistent disorder of initiating or maintaining sleep   . PTSD (post-traumatic stress disorder)   . Restless leg   . Seizure disorder ([place])   . Thyroid disease   . Vitamin D deficiency      Past Surgical History:  Procedure Laterality Date  . EYE SURGERY    . TUBAL LIGATION     Family History:  Family History  Problem Relation Age of Onset  . Hypertension Mother   . Heart attack Mother   . CAD Mother   . Diabetes Father   . Hypertension Father   . Hypertension Sister   . Anxiety disorder Brother   . Depression Brother    Social History:  Social History   Socioeconomic History  . Marital status: Married    Spouse name: roberto  . Number of children: 2  . Years of education: Not on file  . Highest education level: 10th grade  Occupational History  . Occupation: disabled  Social Needs  . Financial resource strain: Not hard at all  . Food insecurity:    Worry: Never true    Inability: Never true  . Transportation needs:    Medical: No    Non-medical: No  Tobacco Use  . Smoking status: Never Smoker  . Smokeless tobacco: Never Used  Substance and Sexual Activity  . Alcohol use: Yes    Alcohol/week: 0.0 standard drinks    Comment: social drinker  . Drug use: No  . Sexual activity: Not Currently    Partners: Male    Birth control/protection:  Other-see comments, I.U.D.    Comment: husband has ED  Lifestyle  . Physical activity:    Days per week: 0 days    Minutes per session: 0 min  . Stress: Very much  Relationships  . Social connections:    Talks on phone: Once a week    Gets together: Once a week    Attends religious service: More than 4 times per year    Active member of club or organization: Yes    Attends meetings of clubs or organizations: More than 4 times per year    Relationship status: Married  Other Topics Concern  . 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 She has good relationship with them.  Assessment:    Musculoskeletal: Strength & Muscle Tone: within normal limits Gait & Station: normal Patient leans: N/A  Psychiatric Specialty Exam: Medication Refill  Pertinent negatives include no rash.  Anxiety  Symptoms include nervous/anxious behavior.      Review of Systems  Constitutional: Positive for weight loss.  HENT: Negative for tinnitus.   Eyes: Negative for double vision.  Skin: Negative for rash.  Neurological: Negative for tremors.  Endo/Heme/Allergies: Does not bruise/bleed easily.  Psychiatric/Behavioral: Positive for depression. The patient is nervous/anxious.     Blood pressure 140/87, pulse 65, temperature 99 F (37.2 C), temperature source Oral, weight 261 lb 3.2 oz (118.5 kg).Body mass index is 47.77 kg/m.  General Appearance: Casual  Eye Contact:  Fair  Speech:  Clear and Coherent  Volume:  Normal  Mood:  Anxious and Depressed  Affect:  Congruent  Thought Process:  Logical  Orientation:  Full (Time, Place, and Person)  Thought Content:  WDL  Suicidal Thoughts:  No  Homicidal Thoughts:  No  Memory:  Immediate;   Fair  Judgement:  Fair  Insight:  Fair  Psychomotor Activity:  Decreased  Concentration:  Fair  Recall:  [employer] of Knowledge: Fair  Language: Fair  Akathisia:  No  Handed:  Right  AIMS (if indicated):  none  Assets:  Communication Skills Desire for Improvement Physical Health  ADL's:  Intact  Cognition: WNL  Sleep:  6-8   Is the patient at risk to self?  No. Has the patient been a risk to self in the past 6 months?  No. Has the patient been a risk to self within the distant past?  No. Is the patient a risk to others?  No. Has the patient been a risk to others in the past 6 months?  No. Has the patient been a risk to others within the distant past?  No.  Current Medications: Current Outpatient Medications  Medication Sig Dispense Refill  . amLODipine (NORVASC) 2.5 MG tablet Take 2.5 mg by mouth daily.    Marland Kitchen amLODipine (NORVASC) 5  MG tablet Take 5 mg by mouth daily.    . ARIPiprazole (ABILIFY) 20 MG tablet Take 1 tablet (20 mg total) by mouth daily. 90 tablet 1  . butalbital-acetaminophen-caffeine (FIORICET WITH CODEINE) 50-325-40-30 MG capsule Take 1 capsule by mouth every 6 (six) hours as needed for headache. 30 capsule 1  . clonazePAM (KLONOPIN) 0.5 MG tablet Take 1 tablet (0.5 mg total) by mouth at bedtime. 30 tablet 2  . desvenlafaxine (PRISTIQ) 100 MG  24 hr tablet Take 1 tablet (100 mg total) by mouth daily. 90 tablet 2  . hydroxychloroquine (PLAQUENIL) 200 MG tablet Take 200 mg by mouth 2 (two) times daily.    Marland Kitchen levothyroxine (SYNTHROID, LEVOTHROID) 25 MCG tablet Take 1 tablet (25 mcg total) by mouth daily. And 50 mcg on Sundays 102 tablet 1  . MIRENA, 52 MG, 20 MCG/24HR IUD     . OZEMPIC, 1 MG/DOSE, 2 MG/1.5ML SOPN INJECT 1MG  SUBCUTANEOUSLY ONCE A WEEK. 3 mL 0  . prednisoLONE acetate (PRED FORTE) 1 % ophthalmic suspension Place 1 drop into the right eye daily.     . SUMAtriptan (IMITREX) 20 MG/ACT nasal spray Place 1 spray (20 mg total) into the nose every 2 (two) hours as needed for migraine. 3 Inhaler 2  . topiramate (TOPAMAX) 100 MG tablet Take 1 tablet (100 mg total) by mouth daily. 90 tablet 1  . Vitamin D, Ergocalciferol, (DRISDOL) 1.25 MG (50000 UT) CAPS capsule Take 1 capsule (50,000 Units total) by mouth every 7 (seven) days. 12 capsule 1   No current facility-administered medications for this visit.     Medical Decision Making:  Established Problem, Stable/Improving (1), Review of Psycho-Social Stressors (1) and Review of Medication Regimen & Side Effects (2)  Treatment Plan Summary:Medication management     Discussed with patient about her medications treatment risks benefits and alternatives.  Pristiq 100 mg in the morning. Abilify 20 mg -at bedtime. Continue Klonopin 0.5 mg by mouth daily at bedtime when necessary.   Topamax 100 mg at bedtime. Marland Kitchen She will follow-up in 2 month.   More than 50%  of the time spent in psychoeducation, counseling and coordination of care.    This note was generated in part or whole with voice recognition software. Voice regonition is usually quite accurate but there are transcription errors that can and very often do occur. I apologize for any typographical errors that were not detected and corrected.   Rainey Pines, MD  11/24/2018, 9:42 AM

## 2018-11-28 ENCOUNTER — Ambulatory Visit (INDEPENDENT_AMBULATORY_CARE_PROVIDER_SITE_OTHER): Payer: BLUE CROSS/BLUE SHIELD

## 2018-11-28 VITALS — BP 112/70 | Temp 97.9°F | Resp 16 | Ht 62.0 in | Wt 262.2 lb

## 2018-11-28 DIAGNOSIS — Z0000 Encounter for general adult medical examination without abnormal findings: Secondary | ICD-10-CM

## 2018-11-28 NOTE — Progress Notes (Addendum)
Subjective:   Heather Jefferson is a 48 y.o. female who presents for an Initial Medicare Annual Wellness Visit.  Review of Systems      Cardiac Risk Factors include: diabetes mellitus;hypertension;obesity (BMI >30kg/m2)     Objective:    Today's Vitals   11/28/18 0816 11/28/18 0818  BP: 112/70   Resp: 16   Temp: 97.9 F (36.6 C)   TempSrc: Oral   Weight: 262 lb 3.2 oz (118.9 kg)   Height: 5\' 2"  (1.575 m)   PainSc:  4    Body mass index is 47.96 kg/m.  Advanced Directives 11/28/2018 10/16/2018 04/04/2018 08/26/2017 05/24/2017 04/15/2017 03/26/2017  Does Patient Have a Medical Advance Directive? No No Yes No No No No  Does patient want to make changes to medical advance directive? - - No - Patient declined - - - -  Would patient like information on creating a medical advance directive? No - Patient declined No - Patient declined - No - Patient declined Yes (MAU/Ambulatory/Procedural Areas - Information given) - No - Patient declined  Some encounter information is confidential and restricted. Go to Review Flowsheets activity to see all data.    Current Medications (verified) Outpatient Encounter Medications as of 11/28/2018  Medication Sig  . amLODipine (NORVASC) 5 MG tablet Take 5 mg by mouth daily.  . ARIPiprazole (ABILIFY) 20 MG tablet Take 1 tablet (20 mg total) by mouth daily.  . butalbital-acetaminophen-caffeine (FIORICET WITH CODEINE) 50-325-40-30 MG capsule Take 1 capsule by mouth every 6 (six) hours as needed for headache.  . clonazePAM (KLONOPIN) 0.5 MG tablet Take 1 tablet (0.5 mg total) by mouth at bedtime.  Marland Kitchen desvenlafaxine (PRISTIQ) 100 MG 24 hr tablet Take 1 tablet (100 mg total) by mouth daily.  . hydroxychloroquine (PLAQUENIL) 200 MG tablet Take 200 mg by mouth 2 (two) times daily.  Marland Kitchen levothyroxine (SYNTHROID, LEVOTHROID) 25 MCG tablet Take 1 tablet (25 mcg total) by mouth daily. And 50 mcg on Sundays  . MIRENA, 52 MG, 20 MCG/24HR IUD   . OZEMPIC, 1 MG/DOSE, 2  MG/1.5ML SOPN INJECT 1MG  SUBCUTANEOUSLY ONCE A WEEK.  . prednisoLONE acetate (PRED FORTE) 1 % ophthalmic suspension Place 1 drop into the right eye daily.   . SUMAtriptan (IMITREX) 20 MG/ACT nasal spray Place 1 spray (20 mg total) into the nose every 2 (two) hours as needed for migraine.  . topiramate (TOPAMAX) 100 MG tablet Take 1 tablet (100 mg total) by mouth daily.  . Vitamin D, Ergocalciferol, (DRISDOL) 1.25 MG (50000 UT) CAPS capsule Take 1 capsule (50,000 Units total) by mouth every 7 (seven) days.  . [DISCONTINUED] amLODipine (NORVASC) 2.5 MG tablet Take 2.5 mg by mouth daily.   No facility-administered encounter medications on file as of 11/28/2018.     Allergies (verified) Amoxicillin-pot clavulanate; Losartan; and Metformin and related   History: Past Medical History:  Diagnosis Date  . ADHD (attention deficit hyperactivity disorder)   . Allergy   . Anemia   . Anxiety   . Depression   . Diabetes mellitus, type II ([place])   . Fibromyalgia   . Generalized anxiety disorder   . Headache   . HIV infection ([place])   . Hypertension   . Major depressive disorder, recurrent episode, moderate ([place])   . Migraine with acute onset aura   . Persistent disorder of initiating or maintaining sleep   . PTSD (post-traumatic stress disorder)   . Restless leg   . Seizure disorder ([place])   . Thyroid disease   .  Vitamin D deficiency    Past Surgical History:  Procedure Laterality Date  . EYE SURGERY    . TUBAL LIGATION     Family History  Problem Relation Age of Onset  . Hypertension Mother   . Heart attack Mother   . CAD Mother   . Diabetes Father   . Hypertension Father   . Hypertension Sister   . Anxiety disorder Brother   . Depression Brother    Social History   Socioeconomic History  . Marital status: Married    Spouse name: roberto  . Number of children: 2  . Years of education: Not on file  . Highest education level: 10th grade  Occupational History  . Occupation:  disabled  Social Needs  . Financial resource strain: Not hard at all  . Food insecurity:    Worry: Never true    Inability: Never true  . Transportation needs:    Medical: No    Non-medical: No  Tobacco Use  . Smoking status: Never Smoker  . Smokeless tobacco: Never Used  Substance and Sexual Activity  . Alcohol use: Yes    Alcohol/week: 0.0 standard drinks    Comment: social drinker  . Drug use: No  . Sexual activity: Not Currently    Partners: Male    Birth control/protection: Other-see comments, I.U.D.    Comment: husband has ED  Lifestyle  . Physical activity:    Days per week: 0 days    Minutes per session: 0 min  . Stress: Rather much  Relationships  . Social connections:    Talks on phone: More than three times a week    Gets together: Once a week    Attends religious service: More than 4 times per year    Active member of club or organization: Yes    Attends meetings of clubs or organizations: More than 4 times per year    Relationship status: Married  Other Topics Concern  . 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    Tobacco Counseling Counseling given: Not Answered   Clinical Intake:  Pre-visit preparation completed: Yes  Pain : 0-10 Pain Score: 4  Pain Type: Chronic pain(fibromyalgia) Pain Location: Neck([place]) Pain Orientation: Left, Right, Mid Pain Descriptors / Indicators: Aching(stiffness) Pain Onset: More than a month ago Pain Frequency: Constant     BMI - recorded: 47.96 Nutritional Status: BMI > 30  Obese Nutritional Risks: None Diabetes: No(prediabetes)  How often do you need to have someone help you when you read instructions, pamphlets, or other written materials from your doctor or pharmacy?: 1 - Never What is the last grade level you completed in school?: 10th grade  Interpreter Needed?: No  Information entered by ::  Clemetine Marker LPN   Activities of Daily Living In your present state of health, do you have any difficulty performing the following activities: 11/28/2018 07/08/2018  Hearing? N N  Vision? N N  Comment wears glasses -  Difficulty concentrating or making decisions? N Y  Comment - concetrating  Walking or climbing stairs? N N  Dressing or bathing? N N  Doing errands, shopping? N -  Preparing Food and eating ? N -  Using the Toilet? N -  In the past six months, have you accidently leaked urine? N -  Do you have problems with loss of bowel control? N -  Managing  your Medications? N -  Managing your Finances? N -  Housekeeping or managing your Housekeeping? N -  Some recent data might be hidden     Immunizations and Health Maintenance Immunization History  Administered Date(s) Administered  . Influenza,inj,Quad PF,6+ Mos 05/17/2015, 10/16/2017, 10/08/2018  . Influenza-Unspecified 07/14/2014  . Pneumococcal Polysaccharide-23 05/25/2013  . Tdap 12/30/2009, 09/30/2018   There are no preventive care reminders to display for this patient.  Patient Care Team: Steele Sizer, MD as PCP - General  Indicate any recent Medical Services you may have received from other than [facility] providers in the past year (date may be approximate).     Assessment:   This is a routine wellness examination for Heather Jefferson.  Hearing/Vision screen Hearing Screening Comments: Pt denies hearing difficulty Vision Screening Comments: Annual vision screenings at [place] issues and exercise activities discussed: Current Exercise Habits: The patient does not participate in regular exercise at present, Exercise limited by: Other - see comments(fibromyalgia)  Goals   None    Depression Screen PHQ 2/9 Scores 11/28/2018 10/08/2018 07/08/2018 07/08/2018 05/07/2018 02/04/2018 10/18/2016  PHQ - 2 Score 6 - 4 4 4 4 2   PHQ- 9 Score 16 - 21 21 15 18 8   Not completed - under the care of psychiatrist  - - -  - -    Fall Risk Fall Risk  11/28/2018 10/08/2018 07/08/2018 05/07/2018 02/04/2018  Falls in the past year? 0 0 No No No  Number falls in past yr: 0 0 - - -  Injury with Fall? 0 0 - - -  Comment - - - - -  Follow up Falls prevention discussed - - - -    FALL RISK PREVENTION PERTAINING TO THE HOME:  Any stairs in or around the home? Yes  If so, do they handrails? No  front steps outside  Home free of loose throw rugs in walkways, pet beds, electrical cords, etc? Yes  Adequate lighting in your home to reduce risk of falls? Yes   ASSISTIVE DEVICES UTILIZED TO PREVENT FALLS:  Life alert? No  Use of a cane, walker or w/c? No  Grab bars in the bathroom? No  Shower chair or bench in shower? No  Elevated toilet seat or a handicapped toilet? No   DME ORDERS:  DME order needed?  No   TIMED UP AND GO:  Was the test performed? Yes .  Length of time to ambulate 10 feet: 6 sec.   GAIT:  Appearance of gait: Gait stead-fast and without the use of an assistive device.   Education: Fall risk prevention has been discussed.  Intervention(s) required? Yes - needs hand rail front steps  Cognitive Function:     6CIT Screen 11/28/2018  What Year? 0 points  What month? 0 points  What time? 0 points  Count back from 20 0 points  Months in reverse 0 points  Repeat phrase 0 points  Total Score 0    Screening Tests Health Maintenance  Topic Date Due  . TETANUS/TDAP  12/31/2019  . PAP SMEAR-Modifier  07/09/2023  . INFLUENZA VACCINE  Completed  . HIV Screening  Completed    Qualifies for Shingles Vaccine? Yes  Zostavax completed per patient. Due for Shingrix. Education has been provided regarding the importance of this vaccine. Pt has been advised to call insurance company to determine out of pocket expense. Advised may also receive vaccine at local pharmacy or Health Dept. Verbalized acceptance and understanding.  Tdap: Up  to date  Flu Vaccine: Up to date  Pneumococcal Vaccine: due  age 26  Cancer Screenings:  Colorectal Screening: due age 32  Mammogram: Completed 10/08/18. Repeat every year  Bone Density: due age 1  Lung Cancer Screening: (Low Dose CT Chest recommended if Age 17-80 years, 30 pack-year currently smoking OR have quit w/in 15years.) does not qualify.   Additional Screening:  Hepatitis C Screening: does qualify; Completed 07/14/18  Vision Screening: Recommended annual ophthalmology exams for early detection of glaucoma and other disorders of the eye. Is the patient up to date with their annual eye exam?  Yes  Who is the provider or what is the name of the office in which the pt attends annual eye exams? [facility]   Dental Screening: Recommended annual dental exams for proper oral hygiene  Community Resource Referral:  CRR required this visit?  No      Plan:    I have personally reviewed and addressed the Medicare Annual Wellness questionnaire and have noted the following in the patient's chart:  A. Medical and social history B. Use of alcohol, tobacco or illicit drugs  C. Current medications and supplements D. Functional ability and status E.  Nutritional status F.  Physical activity G. Advance directives H. List of other physicians I.  Hospitalizations, surgeries, and ER visits in previous 12 months J.  [place] such as hearing and vision if needed, cognitive and depression L. Referrals and appointments   In addition, I have reviewed and discussed with patient certain preventive protocols, quality metrics, and best practice recommendations. A written personalized care plan for preventive services as well as general preventive health recommendations were provided to patient.   Signed,  Clemetine Marker, LPN Nurse Health Advisor   Nurse Notes: pt states she lost her father in January and in counseling for that as well as medication for bipolar depression. PHQ9 16 today but aware of resources if needed. She is  otherwise doing well and appreciative of visit today.

## 2018-11-28 NOTE — Patient Instructions (Signed)
Heather Jefferson , Thank you for taking time to come for your Medicare Wellness Visit. I appreciate your ongoing commitment to your health goals. Please review the following plan we discussed and let me know if I can assist you in the future.   Screening recommendations/referrals: Mammogram: done 10/08/18 Recommended yearly ophthalmology/optometry visit for glaucoma screening and checkup Recommended yearly dental visit for hygiene and checkup  Vaccinations: Influenza vaccine: done 10/08/18 Pneumococcal vaccine: done 01/22/13 Tdap vaccine: done 09/30/18 Shingles vaccine: Shingrix discussed. Please contact your pharmacy for coverage information.     Advanced directives: Advance directive discussed with you today. Even though you declined this today please call our office should you change your mind and we can give you the proper paperwork for you to fill out.  Conditions/risks identified: Recommend healthy eating and water intake.   Next appointment: 01/14/19 9:20 Dr. Ancil Boozer  Preventive Care 40-64 Years, Female Preventive care refers to lifestyle choices and visits with your health care provider that can promote health and wellness. What does preventive care include?  A yearly physical exam. This is also called an annual well check.  Dental exams once or twice a year.  Routine eye exams. Ask your health care provider how often you should have your eyes checked.  Personal lifestyle choices, including:  Daily care of your teeth and gums.  Regular physical activity.  Eating a healthy diet.  Avoiding tobacco and drug use.  Limiting alcohol use.  Practicing safe sex.  Taking low-dose aspirin daily starting at age 68.  Taking vitamin and mineral supplements as recommended by your health care provider. What happens during an annual well check? The services and screenings done by your health care provider during your annual well check will depend on your age, overall health, lifestyle  risk factors, and family history of disease. Counseling  Your health care provider may ask you questions about your:  Alcohol use.  Tobacco use.  Drug use.  Emotional well-being.  Home and relationship well-being.  Sexual activity.  Eating habits.  Work and work Statistician.  Method of birth control.  Menstrual cycle.  Pregnancy history. Screening  You may have the following tests or measurements:  Height, weight, and BMI.  Blood pressure.  Lipid and cholesterol levels. These may be checked every 5 years, or more frequently if you are over 17 years old.  Skin check.  Lung cancer screening. You may have this screening every year starting at age 43 if you have a 30-pack-year history of smoking and currently smoke or have quit within the past 15 years.  Fecal occult blood test (FOBT) of the stool. You may have this test every year starting at age 79.  Flexible sigmoidoscopy or colonoscopy. You may have a sigmoidoscopy every 5 years or a colonoscopy every 10 years starting at age 2.  Hepatitis C blood test.  Hepatitis B blood test.  Sexually transmitted disease (STD) testing.  Diabetes screening. This is done by checking your blood sugar (glucose) after you have not eaten for a while (fasting). You may have this done every 1-3 years.  Mammogram. This may be done every 1-2 years. Talk to your health care provider about when you should start having regular mammograms. This may depend on whether you have a family history of breast cancer.  BRCA-related cancer screening. This may be done if you have a family history of breast, ovarian, tubal, or peritoneal cancers.  Pelvic exam and Pap test. This may be done every 3 years starting  at age 90. Starting at age 85, this may be done every 5 years if you have a Pap test in combination with an HPV test.  Bone density scan. This is done to screen for osteoporosis. You may have this scan if you are at high risk for  osteoporosis. Discuss your test results, treatment options, and if necessary, the need for more tests with your health care provider. Vaccines  Your health care provider may recommend certain vaccines, such as:  Influenza vaccine. This is recommended every year.  Tetanus, diphtheria, and acellular pertussis (Tdap, Td) vaccine. You may need a Td booster every 10 years.  Zoster vaccine. You may need this after age 25.  Pneumococcal 13-valent conjugate (PCV13) vaccine. You may need this if you have certain conditions and were not previously vaccinated.  Pneumococcal polysaccharide (PPSV23) vaccine. You may need one or two doses if you smoke cigarettes or if you have certain conditions. Talk to your health care provider about which screenings and vaccines you need and how often you need them. This information is not intended to replace advice given to you by your health care provider. Make sure you discuss any questions you have with your health care provider. Document Released: 10/07/2015 Document Revised: 05/30/2016 Document Reviewed: 07/12/2015 Elsevier Interactive Patient Education  2017 [employer] Prevention in the Home Falls can cause injuries. They can happen to people of all ages. There are many things you can do to make your home safe and to help prevent falls. What can I do on the outside of my home?  Regularly fix the edges of walkways and driveways and fix any cracks.  Remove anything that might make you trip as you walk through a door, such as a raised step or threshold.  Trim any bushes or trees on the path to your home.  Use bright outdoor lighting.  Clear any walking paths of anything that might make someone trip, such as rocks or tools.  Regularly check to see if handrails are loose or broken. Make sure that both sides of any steps have handrails.  Any raised decks and porches should have guardrails on the edges.  Have any leaves, snow, or ice cleared  regularly.  Use sand or salt on walking paths during winter.  Clean up any spills in your garage right away. This includes oil or grease spills. What can I do in the bathroom?  Use night lights.  Install grab bars by the toilet and in the tub and shower. Do not use towel bars as grab bars.  Use non-skid mats or decals in the tub or shower.  If you need to sit down in the shower, use a plastic, non-slip stool.  Keep the floor dry. Clean up any water that spills on the floor as soon as it happens.  Remove soap buildup in the tub or shower regularly.  Attach bath mats securely with double-sided non-slip rug tape.  Do not have throw rugs and other things on the floor that can make you trip. What can I do in the bedroom?  Use night lights.  Make sure that you have a light by your bed that is easy to reach.  Do not use any sheets or blankets that are too big for your bed. They should not hang down onto the floor.  Have a firm chair that has side arms. You can use this for support while you get dressed.  Do not have throw rugs and other  things on the floor that can make you trip. What can I do in the kitchen?  Clean up any spills right away.  Avoid walking on wet floors.  Keep items that you use a lot in easy-to-reach places.  If you need to reach something above you, use a strong step stool that has a grab bar.  Keep electrical cords out of the way.  Do not use floor polish or wax that makes floors slippery. If you must use wax, use non-skid floor wax.  Do not have throw rugs and other things on the floor that can make you trip. What can I do with my stairs?  Do not leave any items on the stairs.  Make sure that there are handrails on both sides of the stairs and use them. Fix handrails that are broken or loose. Make sure that handrails are as long as the stairways.  Check any carpeting to make sure that it is firmly attached to the stairs. Fix any carpet that is loose  or worn.  Avoid having throw rugs at the top or bottom of the stairs. If you do have throw rugs, attach them to the floor with carpet tape.  Make sure that you have a light switch at the top of the stairs and the bottom of the stairs. If you do not have them, ask someone to add them for you. What else can I do to help prevent falls?  Wear shoes that:  Do not have high heels.  Have rubber bottoms.  Are comfortable and fit you well.  Are closed at the toe. Do not wear sandals.  If you use a stepladder:  Make sure that it is fully opened. Do not climb a closed stepladder.  Make sure that both sides of the stepladder are locked into place.  Ask someone to hold it for you, if possible.  Clearly mark and make sure that you can see:  Any grab bars or handrails.  First and last steps.  Where the edge of each step is.  Use tools that help you move around (mobility aids) if they are needed. These include:  Canes.  Walkers.  Scooters.  Crutches.  Turn on the lights when you go into a dark area. Replace any light bulbs as soon as they burn out.  Set up your furniture so you have a clear path. Avoid moving your furniture around.  If any of your floors are uneven, fix them.  If there are any pets around you, be aware of where they are.  Review your medicines with your doctor. Some medicines can make you feel dizzy. This can increase your chance of falling. Ask your doctor what other things that you can do to help prevent falls. This information is not intended to replace advice given to you by your health care provider. Make sure you discuss any questions you have with your health care provider. Document Released: 07/07/2009 Document Revised: 02/16/2016 Document Reviewed: 10/15/2014 Elsevier Interactive Patient Education  2017 [employer].

## 2018-12-15 ENCOUNTER — Other Ambulatory Visit: Payer: Self-pay | Admitting: Family Medicine

## 2018-12-15 DIAGNOSIS — E8881 Metabolic syndrome: Secondary | ICD-10-CM

## 2018-12-15 NOTE — Telephone Encounter (Signed)
Refill request for general medication. Ozempic to [facility]  Last office visit 10/08/2018   Follow up on 01/14/2019

## 2019-01-14 ENCOUNTER — Other Ambulatory Visit: Payer: Self-pay

## 2019-01-14 ENCOUNTER — Encounter: Payer: Self-pay | Admitting: Family Medicine

## 2019-01-14 ENCOUNTER — Ambulatory Visit (INDEPENDENT_AMBULATORY_CARE_PROVIDER_SITE_OTHER): Payer: BLUE CROSS/BLUE SHIELD | Admitting: Family Medicine

## 2019-01-14 DIAGNOSIS — G43009 Migraine without aura, not intractable, without status migrainosus: Secondary | ICD-10-CM

## 2019-01-14 DIAGNOSIS — E039 Hypothyroidism, unspecified: Secondary | ICD-10-CM | POA: Diagnosis not present

## 2019-01-14 DIAGNOSIS — I10 Essential (primary) hypertension: Secondary | ICD-10-CM

## 2019-01-14 DIAGNOSIS — M05741 Rheumatoid arthritis with rheumatoid factor of right hand without organ or systems involvement: Secondary | ICD-10-CM

## 2019-01-14 DIAGNOSIS — F3160 Bipolar disorder, current episode mixed, unspecified: Secondary | ICD-10-CM

## 2019-01-14 DIAGNOSIS — E8881 Metabolic syndrome: Secondary | ICD-10-CM

## 2019-01-14 DIAGNOSIS — M05742 Rheumatoid arthritis with rheumatoid factor of left hand without organ or systems involvement: Secondary | ICD-10-CM | POA: Diagnosis not present

## 2019-01-14 DIAGNOSIS — E559 Vitamin D deficiency, unspecified: Secondary | ICD-10-CM

## 2019-01-14 MED ORDER — SEMAGLUTIDE (1 MG/DOSE) 2 MG/1.5ML ~~LOC~~ SOPN: 1.0000 mg | PEN_INJECTOR | SUBCUTANEOUS | 2 refills | Status: DC

## 2019-01-14 MED ORDER — SUMATRIPTAN 20 MG/ACT NA SOLN: 20.0000 mg | NASAL | 2 refills | Status: DC | PRN
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
## 2019-01-15 ENCOUNTER — Encounter: Payer: Self-pay | Admitting: Family Medicine

## 2019-01-19 DIAGNOSIS — I7300 Raynaud's syndrome without gangrene: Secondary | ICD-10-CM | POA: Diagnosis not present

## 2019-01-19 DIAGNOSIS — M059 Rheumatoid arthritis with rheumatoid factor, unspecified: Secondary | ICD-10-CM | POA: Diagnosis not present

## 2019-01-19 DIAGNOSIS — Z79899 Other long term (current) drug therapy: Secondary | ICD-10-CM | POA: Diagnosis not present

## 2019-01-19 DIAGNOSIS — M797 Fibromyalgia: Secondary | ICD-10-CM | POA: Diagnosis not present

## 2019-01-26 ENCOUNTER — Other Ambulatory Visit: Payer: Self-pay

## 2019-01-26 ENCOUNTER — Ambulatory Visit (INDEPENDENT_AMBULATORY_CARE_PROVIDER_SITE_OTHER): Payer: BLUE CROSS/BLUE SHIELD | Admitting: Psychiatry

## 2019-01-26 ENCOUNTER — Telehealth: Payer: Self-pay

## 2019-01-26 ENCOUNTER — Encounter: Payer: Self-pay | Admitting: Psychiatry

## 2019-01-26 DIAGNOSIS — F3160 Bipolar disorder, current episode mixed, unspecified: Secondary | ICD-10-CM

## 2019-01-26 DIAGNOSIS — F4312 Post-traumatic stress disorder, chronic: Secondary | ICD-10-CM | POA: Diagnosis not present

## 2019-01-26 MED ORDER — ARIPIPRAZOLE 20 MG PO TABS: 20.0000 mg | ORAL_TABLET | Freq: Every day | ORAL | 1 refills | Status: DC

## 2019-01-26 MED ORDER — DESVENLAFAXINE SUCCINATE ER 50 MG PO TB24: 50.0000 mg | ORAL_TABLET | Freq: Every day | ORAL | 1 refills | Status: DC

## 2019-01-26 MED ORDER — TOPIRAMATE 100 MG PO TABS: 100.0000 mg | ORAL_TABLET | Freq: Every day | ORAL | 1 refills | Status: DC

## 2019-01-26 MED ORDER — CLONAZEPAM 0.5 MG PO TABS: 0.5000 mg | ORAL_TABLET | Freq: Every day | ORAL | 2 refills | Status: DC

## 2019-01-26 NOTE — Telephone Encounter (Signed)
received a request for a pa for desvenlafaxine succinate er 50mg  .

## 2019-01-26 NOTE — Progress Notes (Signed)
Patient ID: Heather Jefferson, female   DOB: 1970/10/07, 48 y.o.   MRN: [id number]    Patient is a 48 year old female with history of bipolar disorder including PTSD who was seen on video for follow-up. She reported that she has been doing well. She is concerned about her weight gain has followed upwith her PCP. They are starting her on medication which has Wellbutrin and wants to know about her medications. We discussed about taking her Pristiq 100 mg and causing increase in the manic symptoms with a combination of Wellbutrin. She also reported that she has history of anxiety on Wellbutrin. Discussed the patient about decreasing the dose of Pristiq and she agreed with the plan. She reported that she has been compliant with her medications. She denied having any suicidal ideations or plans. She is able to contract for safety.   Plan.  patient will continue on herTopamax and Abilify as prescribed.  I will decrease the dose of Pristiq 50 mg Q daily. Klonopin prescription called in at the pharmacy with two refills.    Follow-up in two months.   I discussed the assessment and treatment plan with the patient. The patient was provided an opportunity to ask questions and all were answered. The patient agreed with the plan and demonstrated an understanding of the instructions.   The patient was advised to call back or seek an in-person evaluation if the symptoms worsen or if the condition fails to improve as anticipated.   I provided 15 minutes of -face-to-face time during this encounter

## 2019-01-26 NOTE — Telephone Encounter (Signed)
went online to [email or web address] and submited prior authorization.  it was approved from  12-27-18 to 01-26-20

## 2019-02-09 DIAGNOSIS — H524 Presbyopia: Secondary | ICD-10-CM | POA: Diagnosis not present

## 2019-03-19 ENCOUNTER — Other Ambulatory Visit: Payer: Self-pay

## 2019-03-19 ENCOUNTER — Ambulatory Visit (INDEPENDENT_AMBULATORY_CARE_PROVIDER_SITE_OTHER): Payer: BC Managed Care – PPO | Admitting: Family Medicine

## 2019-03-19 ENCOUNTER — Encounter: Payer: Self-pay | Admitting: Family Medicine

## 2019-03-19 VITALS — BP 128/90 | HR 96 | Temp 98.2°F | Resp 16 | Ht 62.0 in | Wt 268.9 lb

## 2019-03-19 DIAGNOSIS — I10 Essential (primary) hypertension: Secondary | ICD-10-CM | POA: Diagnosis not present

## 2019-03-19 MED ORDER — CONTRAVE 8-90 MG PO TB12: 2.0000 | ORAL_TABLET | Freq: Two times a day (BID) | ORAL | 0 refills | Status: DC

## 2019-03-19 NOTE — Patient Instructions (Addendum)
Start pristiq  50 mg when you get to Contrave 1 tablet twice daily  Download Myfitnesspal app and log all your calories Try to have more protein and less carbohydrates
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
## 2019-03-30 ENCOUNTER — Ambulatory Visit (INDEPENDENT_AMBULATORY_CARE_PROVIDER_SITE_OTHER): Payer: BC Managed Care – PPO | Admitting: Psychiatry

## 2019-03-30 ENCOUNTER — Other Ambulatory Visit: Payer: Self-pay

## 2019-03-30 ENCOUNTER — Encounter: Payer: Self-pay | Admitting: Psychiatry

## 2019-03-30 DIAGNOSIS — F4312 Post-traumatic stress disorder, chronic: Secondary | ICD-10-CM | POA: Diagnosis not present

## 2019-03-30 DIAGNOSIS — F3160 Bipolar disorder, current episode mixed, unspecified: Secondary | ICD-10-CM | POA: Diagnosis not present

## 2019-03-30 MED ORDER — TOPIRAMATE 100 MG PO TABS: 100.0000 mg | ORAL_TABLET | Freq: Every day | ORAL | 1 refills | Status: DC

## 2019-03-30 MED ORDER — DESVENLAFAXINE SUCCINATE ER 50 MG PO TB24: 50.0000 mg | ORAL_TABLET | Freq: Every day | ORAL | 1 refills | Status: DC

## 2019-03-30 MED ORDER — ARIPIPRAZOLE 20 MG PO TABS: 20.0000 mg | ORAL_TABLET | Freq: Every day | ORAL | 1 refills | Status: DC

## 2019-03-30 NOTE — Progress Notes (Signed)
Patient ID: Heather Jefferson, female   DOB: [birth year], 48 y.o.   MRN: [id number]   Patient is a 48 year old femalewith history of PTSD and mood symptoms who was seen for follow-up. She reported that she has been feeling depressed and has been grieving her father who passed away in 2022-10-21. She reported that she continues to feel depressed and anxiousand has been taking her medications. She does not sleep well at night. Patient reported that she was a started on Contrave by her primary care physician recently to help her and was weight. We discussed about the interaction between her medications as she is also on Pristiq and Abilify and also takes PRN Imitrex to help with her headaches. She reported that she will start taking melatonin to help her sleep as she is losing her sleep due to the new medication. She reported that she has not lost any weight.  Patient reported that she does not have any suicidal or homicidal ideations or plans.  Plan Continue medications as prescribed. Patient to start taking melatonin on a PRN basis to help her sleep. Follow up in one month earlier depending on her symptoms.  I discussed the assessment and treatment plan with the patient. The patient was provided an opportunity to ask questions and all were answered. The patient agreed with the plan and demonstrated an understanding of the instructions.  The patient was advised to call back or seek an in-person evaluation if the symptoms worsen or if the condition fails to improve as anticipated.  I provided21minutes of non-face-to-face time during this encounter.

## 2019-04-02 ENCOUNTER — Other Ambulatory Visit: Payer: Self-pay | Admitting: Family Medicine

## 2019-04-02 DIAGNOSIS — E039 Hypothyroidism, unspecified: Secondary | ICD-10-CM

## 2019-04-02 NOTE — Telephone Encounter (Signed)
Refill request for thyroid medication. Levothyroxine   Last Physical: 03/19/2019   Lab Results  Component Value Date   TSH 1.340 05/07/2018    Follow up 05/04/2019

## 2019-04-28 DIAGNOSIS — E876 Hypokalemia: Secondary | ICD-10-CM | POA: Diagnosis not present

## 2019-04-28 DIAGNOSIS — I10 Essential (primary) hypertension: Secondary | ICD-10-CM | POA: Diagnosis not present

## 2019-04-30 ENCOUNTER — Encounter: Payer: Self-pay | Admitting: Family Medicine

## 2019-05-04 ENCOUNTER — Encounter (INDEPENDENT_AMBULATORY_CARE_PROVIDER_SITE_OTHER): Payer: Self-pay

## 2019-05-04 ENCOUNTER — Encounter: Payer: Self-pay | Admitting: Family Medicine

## 2019-05-04 ENCOUNTER — Ambulatory Visit (INDEPENDENT_AMBULATORY_CARE_PROVIDER_SITE_OTHER): Payer: BC Managed Care – PPO | Admitting: Psychiatry

## 2019-05-04 ENCOUNTER — Telehealth: Payer: Self-pay

## 2019-05-04 ENCOUNTER — Encounter: Payer: Self-pay | Admitting: Psychiatry

## 2019-05-04 ENCOUNTER — Other Ambulatory Visit: Payer: Self-pay

## 2019-05-04 ENCOUNTER — Ambulatory Visit (INDEPENDENT_AMBULATORY_CARE_PROVIDER_SITE_OTHER): Payer: BC Managed Care – PPO | Admitting: Family Medicine

## 2019-05-04 DIAGNOSIS — F3160 Bipolar disorder, current episode mixed, unspecified: Secondary | ICD-10-CM

## 2019-05-04 DIAGNOSIS — E559 Vitamin D deficiency, unspecified: Secondary | ICD-10-CM | POA: Diagnosis not present

## 2019-05-04 DIAGNOSIS — E039 Hypothyroidism, unspecified: Secondary | ICD-10-CM | POA: Diagnosis not present

## 2019-05-04 DIAGNOSIS — G43009 Migraine without aura, not intractable, without status migrainosus: Secondary | ICD-10-CM | POA: Diagnosis not present

## 2019-05-04 DIAGNOSIS — E7800 Pure hypercholesterolemia, unspecified: Secondary | ICD-10-CM | POA: Diagnosis not present

## 2019-05-04 DIAGNOSIS — R739 Hyperglycemia, unspecified: Secondary | ICD-10-CM | POA: Diagnosis not present

## 2019-05-04 DIAGNOSIS — F4312 Post-traumatic stress disorder, chronic: Secondary | ICD-10-CM

## 2019-05-04 DIAGNOSIS — E8881 Metabolic syndrome: Secondary | ICD-10-CM | POA: Diagnosis not present

## 2019-05-04 DIAGNOSIS — I10 Essential (primary) hypertension: Secondary | ICD-10-CM

## 2019-05-04 DIAGNOSIS — M059 Rheumatoid arthritis with rheumatoid factor, unspecified: Secondary | ICD-10-CM

## 2019-05-04 DIAGNOSIS — M797 Fibromyalgia: Secondary | ICD-10-CM | POA: Diagnosis not present

## 2019-05-04 MED ORDER — CLONAZEPAM 0.5 MG PO TABS: 0.5000 mg | ORAL_TABLET | Freq: Every day | ORAL | 2 refills | Status: DC

## 2019-05-04 MED ORDER — ARIPIPRAZOLE 20 MG PO TABS: 20.0000 mg | ORAL_TABLET | Freq: Every day | ORAL | 1 refills | Status: DC

## 2019-05-04 MED ORDER — DESVENLAFAXINE SUCCINATE ER 50 MG PO TB24: 50.0000 mg | ORAL_TABLET | Freq: Every day | ORAL | 1 refills | Status: DC

## 2019-05-04 MED ORDER — TOPIRAMATE 100 MG PO TABS: 100.0000 mg | ORAL_TABLET | Freq: Every day | ORAL | 1 refills | Status: DC

## 2019-05-04 MED ORDER — VITAMIN D (ERGOCALCIFEROL) 1.25 MG (50000 UNIT) PO CAPS: 50000.0000 [IU] | ORAL_CAPSULE | ORAL | 1 refills | Status: DC

## 2019-05-04 MED ORDER — CONTRAVE 8-90 MG PO TB12: 2.0000 | ORAL_TABLET | Freq: Two times a day (BID) | ORAL | 2 refills | Status: DC

## 2019-05-04 MED ORDER — OZEMPIC (1 MG/DOSE) 2 MG/1.5ML ~~LOC~~ SOPN: 1.0000 mg | PEN_INJECTOR | SUBCUTANEOUS | 2 refills | Status: DC

## 2019-05-04 NOTE — Telephone Encounter (Signed)
you need to call the pharmacy about klonopin they will not fill until they speak with you.
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
## 2019-05-04 NOTE — Progress Notes (Signed)
Patient ID: Heather Jefferson, female   DOB: 06-30-1971, 48 y.o.   MRN: [id number]   Patient is a 48 year old female with history of bipolar who was evaluated for follow-up.Patient reported that she has been taking the medications prescribed by her PCP to help her lose weight including naltrexone and Wellbutrin combination. Patient reported that she continues to have problems with sleep and she is able to sleep initially but then she will lose her sleep and wake up in 5 to 6 hours. Patient stated that she is going to follow up with her PCP and discuss about the weight loss as she has been trying to lose weight. Patient reported that she has been staying at home. She's compliant with her medication and she takes Topamax and Klonopin at bedtime. She denied having any adverse effects from the medications. She is able to contract for safety  She denied having any suicidal or homicidal ideation the class. She denied having any perceptual disturbances.  Plan I will refill her medications. I called in a prescription for Klonopin at the local pharmacy.  She will follow-up in three months or later depending on her symptoms.   I connected with patient via telemedicine application and verified that I am speaking with the correct person using two identifiers.  I discussed the limitations of evaluation and management by telemedicine and the availability of in person appointments. The patient expressed understanding and agreed to proceed.   I discussed the assessment and treatment plan with the patient. The patient was provided an opportunity to ask questions and all were answered. The patient agreed with the plan and demonstrated an understanding of the instructions.   The patient was advised to call back or seek an in-person evaluation if the symptoms worsen or if the condition fails to improve as anticipated.   I provided 15 minutes of non-face-to-face time during this encounter.

## 2019-05-05 LAB — COMPLETE METABOLIC PANEL WITH GFR
AG Ratio: 1.5 (calc) (ref 1.0–2.5)
ALT: 14 U/L (ref 6–29)
AST: 14 U/L (ref 10–35)
Albumin: 4.3 g/dL (ref 3.6–5.1)
Alkaline phosphatase (APISO): 108 U/L (ref 31–125)
BUN: 8 mg/dL (ref 7–25)
CO2: 28 mmol/L (ref 20–32)
Calcium: 9.6 mg/dL (ref 8.6–10.2)
Chloride: 106 mmol/L (ref 98–110)
Creat: 0.9 mg/dL (ref 0.50–1.10)
GFR, Est African American: 87 mL/min/{1.73_m2} (ref >=60)
GFR, Est Non African American: 75 mL/min/{1.73_m2} (ref >=60)
Globulin: 2.9 g/dL (calc) (ref 1.9–3.7)
Glucose, Bld: 84 mg/dL (ref 65–99)
Potassium: 4 mmol/L (ref 3.5–5.3)
Sodium: 141 mmol/L (ref 135–146)
Total Bilirubin: 0.4 mg/dL (ref 0.2–1.2)
Total Protein: 7.2 g/dL (ref 6.1–8.1)

## 2019-05-05 LAB — CBC WITH DIFFERENTIAL/PLATELET
Absolute Monocytes: 310 cells/uL (ref 200–950)
Basophils Absolute: 40 cells/uL (ref 0–200)
Basophils Relative: 0.8 %
Eosinophils Absolute: 60 cells/uL (ref 15–500)
Eosinophils Relative: 1.2 %
HCT: 42.2 % (ref 35.0–45.0)
Hemoglobin: 14.1 g/dL (ref 11.7–15.5)
Lymphs Abs: 1945 cells/uL (ref 850–3900)
MCH: 29.2 pg (ref 27.0–33.0)
MCHC: 33.4 g/dL (ref 32.0–36.0)
MCV: 87.4 fL (ref 80.0–100.0)
MPV: 11.6 fL (ref 7.5–12.5)
Monocytes Relative: 6.2 %
Neutro Abs: 2645 cells/uL (ref 1500–7800)
Neutrophils Relative %: 52.9 %
Platelets: 196 10*3/uL (ref 140–400)
RBC: 4.83 10*6/uL (ref 3.80–5.10)
RDW: 13.4 % (ref 11.0–15.0)
Total Lymphocyte: 38.9 %
WBC: 5 10*3/uL (ref 3.8–10.8)

## 2019-05-05 LAB — TSH: TSH: 2.22 mIU/L

## 2019-05-05 LAB — HEMOGLOBIN A1C
Hgb A1c MFr Bld: 5.2 % of total Hgb (ref <5.7)
Mean Plasma Glucose: 103 (calc)
eAG (mmol/L): 5.7 (calc)

## 2019-05-05 LAB — SEDIMENTATION RATE: Sed Rate: 11 mm/h (ref 0–20)

## 2019-05-05 LAB — LIPID PANEL
Cholesterol: 200 mg/dL — ABNORMAL HIGH (ref <200)
HDL: 48 mg/dL — ABNORMAL LOW (ref >=50)
LDL Cholesterol (Calc): 125 mg/dL (calc) — ABNORMAL HIGH
Non-HDL Cholesterol (Calc): 152 mg/dL (calc) — ABNORMAL HIGH (ref <130)
Total CHOL/HDL Ratio: 4.2 (calc) (ref <5.0)
Triglycerides: 156 mg/dL — ABNORMAL HIGH (ref <150)

## 2019-05-06 ENCOUNTER — Encounter: Payer: Self-pay | Admitting: Family Medicine

## 2019-05-21 DIAGNOSIS — M059 Rheumatoid arthritis with rheumatoid factor, unspecified: Secondary | ICD-10-CM | POA: Diagnosis not present

## 2019-05-21 DIAGNOSIS — Z79899 Other long term (current) drug therapy: Secondary | ICD-10-CM | POA: Diagnosis not present

## 2019-05-21 DIAGNOSIS — M2550 Pain in unspecified joint: Secondary | ICD-10-CM | POA: Insufficient documentation

## 2019-05-21 DIAGNOSIS — M797 Fibromyalgia: Secondary | ICD-10-CM | POA: Diagnosis not present

## 2019-06-30 ENCOUNTER — Other Ambulatory Visit: Payer: Self-pay | Admitting: Family Medicine

## 2019-06-30 DIAGNOSIS — E039 Hypothyroidism, unspecified: Secondary | ICD-10-CM

## 2019-07-13 ENCOUNTER — Ambulatory Visit (INDEPENDENT_AMBULATORY_CARE_PROVIDER_SITE_OTHER): Payer: BC Managed Care – PPO | Admitting: Psychiatry

## 2019-07-13 ENCOUNTER — Other Ambulatory Visit: Payer: Self-pay

## 2019-07-13 ENCOUNTER — Ambulatory Visit: Payer: Medicare Other | Admitting: Psychiatry

## 2019-07-13 ENCOUNTER — Encounter: Payer: Self-pay | Admitting: Psychiatry

## 2019-07-13 DIAGNOSIS — F3130 Bipolar disorder, current episode depressed, mild or moderate severity, unspecified: Secondary | ICD-10-CM

## 2019-07-13 DIAGNOSIS — F4312 Post-traumatic stress disorder, chronic: Secondary | ICD-10-CM | POA: Diagnosis not present

## 2019-07-13 MED ORDER — DESVENLAFAXINE SUCCINATE ER 100 MG PO TB24: 100.0000 mg | ORAL_TABLET | Freq: Every day | ORAL | 1 refills | Status: DC

## 2019-07-13 NOTE — Progress Notes (Signed)
[facility] MD OP Progress Note  I connected with  Heather Jefferson on 07/13/19 by a video enabled telemedicine application and verified that I am speaking with the correct person using two identifiers.   I discussed the limitations of evaluation and management by telemedicine. The patient expressed understanding and agreed to proceed.   07/13/2019 8:29 AM Heather Jefferson  MRN:  [id number]  Chief Complaint:  "I have been feeling depressed lately."  HPI: This is a 48 y/o female with hx of Bipolar disorder and chronic PTSD now seen for follow up. Pt reported that she has started to feel sad with crying spells lately. She feels low in energy levels and poor concentration. She is still taking Contrave for weight loss prescribed by her PCP and she feels he is making decent progress in her weight loss.  Pt has tried several different antidepressant options in the past including- Prozac, Lexapro, Zoloft, Effexor. She is currently taking pristiq 50 mg daily. She informed she was on 100 mg dose but dose was decreased to 50 mg when she started Contrave.  Pt was agreeable to the recommendation of increasing the dose of Pristiq back to 100 mg daily for optimal symptom control.  She denied any nightmares of flashbacks. She denied any other issues or concerns.  Visit Diagnosis: Chronic post-traumatic stress disorder (PTSD)  Bipolar I disorder, most recent episode depressed ([place])    Past Psychiatric History: Chronic post-traumatic stress disorder (PTSD)  Bipolar I disorder, most recent episode depressed [facility])    Past Medical History:  Past Medical History:  Diagnosis Date  . ADHD (attention deficit hyperactivity disorder)   . Allergy   . Anemia   . Anxiety   . Depression   . Diabetes mellitus, type II ([place])   . Fibromyalgia   . Generalized anxiety disorder   . Headache   . HIV infection ([place])   . Hypertension   . Major depressive disorder, recurrent episode, moderate ([place])   .  Migraine with acute onset aura   . Persistent disorder of initiating or maintaining sleep   . PTSD (post-traumatic stress disorder)   . Restless leg   . Seizure disorder ([place])   . Thyroid disease   . Vitamin D deficiency     Past Surgical History:  Procedure Laterality Date  . EYE SURGERY    . TUBAL LIGATION      Family Psychiatric History: depression, anxiety in siblings  Family History:  Family History  Problem Relation Age of Onset  . Hypertension Mother   . Heart attack Mother   . CAD Mother   . Diabetes Father   . Hypertension Father   . Hypertension Sister   . Anxiety disorder Brother   . Depression Brother     Social History:  Social History   Socioeconomic History  . Marital status: Married    Spouse name: roberto  . Number of children: 2  . Years of education: Not on file  . Highest education level: 10th grade  Occupational History  . Occupation: disabled  Social Needs  . Financial resource strain: Not hard at all  . Food insecurity    Worry: Never true    Inability: Never true  . Transportation needs    Medical: No    Non-medical: No  Tobacco Use  . Smoking status: Never Smoker  . Smokeless tobacco: Never Used  Substance and Sexual Activity  . Alcohol use: Yes    Alcohol/week: 0.0 standard drinks  Comment: social drinker  . Drug use: No  . Sexual activity: Not Currently    Partners: Male    Birth control/protection: Other-see comments, I.U.D.    Comment: husband has ED  Lifestyle  . Physical activity    Days per week: 0 days    Minutes per session: 0 min  . Stress: Rather much  Relationships  . Social connections    Talks on phone: More than three times a week    Gets together: Once a week    Attends religious service: More than 4 times per year    Active member of club or organization: Yes    Attends meetings of clubs or organizations: More than 4 times per year    Relationship status: Married  Other Topics Concern  . 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 Amoxicillin-Pot Clavulanate Hives  . Losartan Cough  . Metformin And Related Diarrhea and Nausea And Vomiting    Metabolic Disorder Labs: Lab Results  Component Value Date   HGBA1C 5.2 05/04/2019   MPG 103 05/04/2019   MPG 123 10/18/2017   No results found for: PROLACTIN Lab Results  Component Value Date   CHOL 200 (H) 05/04/2019   TRIG 156 (H) 05/04/2019   HDL 48 (L) 05/04/2019   CHOLHDL 4.2 05/04/2019   VLDL 38 (H) 10/18/2016   LDLCALC 125 (H) 05/04/2019   LDLCALC 115 (H) 07/14/2018   Lab Results  Component Value Date   TSH 2.22 05/04/2019   TSH 1.340 05/07/2018    Therapeutic Level Labs: No results found for: LITHIUM No results found for: VALPROATE No components found for:  CBMZ  Current Medications: Current Outpatient Medications  Medication Sig Dispense Refill  . amLODipine (NORVASC) 5 MG tablet Take 5 mg by mouth daily.    . ARIPiprazole (ABILIFY) 20 MG tablet Take 1 tablet (20 mg total) by mouth daily. 90 tablet 1  . clonazePAM (KLONOPIN) 0.5 MG tablet Take 1 tablet (0.5 mg total) by mouth at bedtime. 30 tablet 2  . desvenlafaxine (PRISTIQ) 50 MG 24 hr tablet Take 1 tablet (50 mg total) by mouth daily. 90 tablet 1  . hydroxychloroquine (PLAQUENIL) 200 MG tablet Take 200 mg by mouth 2 (two) times daily.    Marland Kitchen levothyroxine (SYNTHROID) 25 MCG tablet TAKE 1 TABLET DAILY AND 2 TABLETS (50 MCG)  ON SUNDAYS 102 tablet 0  . MIRENA, 52 MG, 20 MCG/24HR IUD     . Naltrexone-buPROPion HCl ER (CONTRAVE) 8-90 MG TB12 Take 2 tablets by mouth 2 (two) times daily. 120 tablet 2  . prednisoLONE acetate (PRED FORTE) 1 % ophthalmic suspension Place 1 drop into the right eye daily.     . Semaglutide, 1 MG/DOSE, (OZEMPIC, 1 MG/DOSE,) 2 MG/1.5ML SOPN Inject 1 mg into the skin once a week. 3 mL 2  .  SUMAtriptan (IMITREX) 20 MG/ACT nasal spray Place 1 spray (20 mg total) into the nose every 2 (two) hours as needed for migraine. 3 Inhaler 2  . topiramate (TOPAMAX) 100 MG tablet Take 1 tablet (100 mg total) by mouth daily. 90 tablet 1  . Vitamin D, Ergocalciferol, (DRISDOL) 1.25 MG (50000 UT) CAPS capsule Take 1 capsule (50,000 Units total) by mouth every 7 (seven) days. 12 capsule 1   No current facility-administered medications for this visit.  Musculoskeletal: Strength & Muscle Tone: Unable to assess due to telemed visit [place]: Unable to assess due to telemed visit Patient leans: Unable to assess due to telemed visit  Psychiatric Specialty Exam: ROS  There were no vitals taken for this visit.There is no height or weight on file to calculate BMI.  General Appearance: Well Groomed  Eye Contact:  Good  Speech:  Clear and Coherent  Volume:  Normal  Mood:  Depressed  Affect:  Congruent  Thought Process:  Goal Directed, Linear and Descriptions of Associations: Intact  Orientation:  Full (Time, Place, and Person)  Thought Content: Logical   Suicidal Thoughts:  No  Homicidal Thoughts:  No  Memory:  Recent;   Good Remote;   Good  Judgement:  Fair  Insight:  Fair  Psychomotor Activity:  Negative  Concentration:  Concentration: Good and Attention Span: Good  Recall:  Good  Fund of Knowledge: Good  Language: Good  Akathisia:  No  Handed:  Right  AIMS (if indicated): not done  Assets:  Communication Skills Desire for Improvement Financial Resources/Insurance Housing Social Support Transportation  ADL's:  Intact  Cognition: WNL  Sleep:  Fair   Screenings: GAD-7     Office Visit from 07/08/2018 in [facility] Office Visit from 05/07/2018 in [facility] Office Visit from 02/04/2018 in [facility] Office Visit from 04/16/2016 in [facility]  Total GAD-7 Score  16  6  14  19     PHQ2-9      Office Visit from 05/04/2019 in [facility] Office Visit from 03/19/2019 in [facility] Office Visit from 01/14/2019 in [facility] from 11/28/2018 in [facility] Office Visit from 07/08/2018 in [facility]  PHQ-2 Total Score  4  3  5  6  4   PHQ-9 Total Score  15  15  14  16  21        Assessment and Plan: 48 y/o female with hx of Bipolar d/o and PTSD now complaining of relapse of depressive symptoms. Will increase the dose of Pristiq to 100 mg, pt has been on this dose in the past with good response.  1. Chronic post-traumatic stress disorder (PTSD) Increase Pristiq 100 mg daily  2. Bipolar I disorder, most recent episode depressed ([facility]) Increase Pristiq 100 mg daily Abilify 20 mg daily Topamx 100 mg daily Clonazepam 0.5 mg HS PRN.  Continue rest of the regimen. Pt informed that she recently got all prescriptions refilled and needs refill for increased doe of pristiq only.  F/up in 2 months.   [place] Crane, MD 07/13/2019, 8:29 AM

## 2019-08-04 ENCOUNTER — Encounter: Payer: Self-pay | Admitting: Family Medicine

## 2019-08-04 ENCOUNTER — Other Ambulatory Visit: Payer: Self-pay

## 2019-08-04 ENCOUNTER — Ambulatory Visit (INDEPENDENT_AMBULATORY_CARE_PROVIDER_SITE_OTHER): Payer: BC Managed Care – PPO | Admitting: Family Medicine

## 2019-08-04 DIAGNOSIS — M05742 Rheumatoid arthritis with rheumatoid factor of left hand without organ or systems involvement: Secondary | ICD-10-CM | POA: Diagnosis not present

## 2019-08-04 DIAGNOSIS — F3160 Bipolar disorder, current episode mixed, unspecified: Secondary | ICD-10-CM | POA: Diagnosis not present

## 2019-08-04 DIAGNOSIS — E8881 Metabolic syndrome: Secondary | ICD-10-CM | POA: Diagnosis not present

## 2019-08-04 DIAGNOSIS — Z23 Encounter for immunization: Secondary | ICD-10-CM | POA: Diagnosis not present

## 2019-08-04 DIAGNOSIS — I10 Essential (primary) hypertension: Secondary | ICD-10-CM | POA: Diagnosis not present

## 2019-08-04 DIAGNOSIS — E039 Hypothyroidism, unspecified: Secondary | ICD-10-CM | POA: Diagnosis not present

## 2019-08-04 DIAGNOSIS — M05741 Rheumatoid arthritis with rheumatoid factor of right hand without organ or systems involvement: Secondary | ICD-10-CM | POA: Diagnosis not present

## 2019-08-04 DIAGNOSIS — M797 Fibromyalgia: Secondary | ICD-10-CM

## 2019-08-04 DIAGNOSIS — G43009 Migraine without aura, not intractable, without status migrainosus: Secondary | ICD-10-CM

## 2019-08-04 MED ORDER — CONTRAVE 8-90 MG PO TB12: 2.0000 | ORAL_TABLET | Freq: Two times a day (BID) | ORAL | 2 refills | Status: DC

## 2019-08-04 MED ORDER — SUMATRIPTAN 20 MG/ACT NA SOLN: 20.0000 mg | NASAL | 2 refills | Status: DC | PRN

## 2019-08-04 MED ORDER — OZEMPIC (1 MG/DOSE) 2 MG/1.5ML ~~LOC~~ SOPN: 1.0000 mg | PEN_INJECTOR | SUBCUTANEOUS | 2 refills | Status: DC

## 2019-08-04 MED ORDER — PREGABALIN 50 MG PO CAPS: 50.0000 mg | ORAL_CAPSULE | Freq: Three times a day (TID) | ORAL | 0 refills | Status: DC
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
## 2019-08-13 ENCOUNTER — Other Ambulatory Visit: Payer: Self-pay | Admitting: Psychiatry

## 2019-08-31 NOTE — Telephone Encounter (Signed)
Pt need refills on her mediation

## 2019-09-08 ENCOUNTER — Ambulatory Visit (INDEPENDENT_AMBULATORY_CARE_PROVIDER_SITE_OTHER): Payer: BC Managed Care – PPO | Admitting: Family Medicine

## 2019-09-08 ENCOUNTER — Other Ambulatory Visit: Payer: Self-pay

## 2019-09-08 ENCOUNTER — Encounter: Payer: Self-pay | Admitting: Family Medicine

## 2019-09-08 VITALS — BP 126/72 | HR 90 | Temp 98.3°F | Resp 14 | Ht 62.0 in | Wt 258.6 lb

## 2019-09-08 DIAGNOSIS — R21 Rash and other nonspecific skin eruption: Secondary | ICD-10-CM | POA: Diagnosis not present

## 2019-09-08 DIAGNOSIS — Z23 Encounter for immunization: Secondary | ICD-10-CM

## 2019-09-08 MED ORDER — ZOSTER VAC RECOMB ADJUVANTED 50 MCG/0.5ML IM SUSR: 0.5000 mL | Freq: Once | INTRAMUSCULAR | 1 refills | Status: AC

## 2019-09-08 MED ORDER — TRIAMCINOLONE ACETONIDE 0.1 % EX OINT: 1.0000 "application " | TOPICAL_OINTMENT | Freq: Two times a day (BID) | CUTANEOUS | 0 refills | Status: DC

## 2019-09-08 MED ORDER — VALACYCLOVIR HCL 1 G PO TABS: 1000.0000 mg | ORAL_TABLET | Freq: Two times a day (BID) | ORAL | 0 refills | Status: AC
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
## 2019-09-08 NOTE — Patient Instructions (Signed)
Apply the steroid ointment to affected area 2x a day for a few days, as soon as things start to improve, can decrease the dose to applying only daily for up to a week.  If the rash spreads or worsens in the next few days - or looks like typical shingles blisters - start the valtrex as soon as you notice that.     Shingles  Shingles, which is also known as herpes zoster, is an infection that causes a painful skin rash and fluid-filled blisters. It is caused by a virus. Shingles only develops in people who:  Have had chickenpox.  Have been given a medicine to protect against chickenpox (have been vaccinated). Shingles is rare in this group. What are the causes? Shingles is caused by varicella-zoster virus (VZV). This is the same virus that causes chickenpox. After a person is exposed to VZV, the virus stays in the body in an inactive (dormant) state. Shingles develops if the virus is reactivated. This can happen many years after the first (initial) exposure to VZV. It is not known what causes this virus to be reactivated. What increases the risk? People who have had chickenpox or received the chickenpox vaccine are at risk for shingles. Shingles infection is more common in people who:  Are older than age 79.  Have a weakened disease-fighting system (immune system), such as people with: ? HIV. ? AIDS. ? Cancer.  Are taking medicines that weaken the immune system, such as transplant medicines.  Are experiencing a lot of stress. What are the signs or symptoms? Early symptoms of this condition include itching, tingling, and pain in an area on your skin. Pain may be described as burning, stabbing, or throbbing. A few days or weeks after early symptoms start, a painful red rash appears. The rash is usually on one side of the body and has a band-like or belt-like pattern. The rash eventually turns into fluid-filled blisters that break open, change into scabs, and dry up in about 2-3 weeks. At  any time during the infection, you may also develop:  A fever.  Chills.  A headache.  An upset stomach. How is this diagnosed? This condition is diagnosed with a skin exam. Skin or fluid samples may be taken from the blisters before a diagnosis is made. These samples are examined under a microscope or sent to a lab for testing. How is this treated? The rash may last for several weeks. There is not a specific cure for this condition. Your health care provider will probably prescribe medicines to help you manage pain, recover more quickly, and avoid long-term problems. Medicines may include:  Antiviral drugs.  Anti-inflammatory drugs.  Pain medicines.  Anti-itching medicines (antihistamines). If the area involved is on your face, you may be referred to a specialist, such as an eye doctor (ophthalmologist) or an ear, nose, and throat (ENT) doctor (otolaryngologist) to help you avoid eye problems, chronic pain, or disability. Follow these instructions at home: Medicines  Take over-the-counter and prescription medicines only as told by your health care provider.  Apply an anti-itch cream or numbing cream to the affected area as told by your health care provider. Relieving itching and discomfort   Apply cold, wet cloths (cold compresses) to the area of the rash or blisters as told by your health care provider.  Cool baths can be soothing. Try adding baking soda or dry oatmeal to the water to reduce itching. Do not bathe in hot water. Blister and rash care  Keep your rash covered with a loose bandage (dressing). Wear loose-fitting clothing to help ease the pain of material rubbing against the rash.  Keep your rash and blisters clean by washing the area with mild soap and cool water as told by your health care provider.  Check your rash every day for signs of infection. Check for: ? More redness, swelling, or pain. ? Fluid or blood. ? Warmth. ? Pus or a bad smell.  Do not  scratch your rash or pick at your blisters. To help avoid scratching: ? Keep your fingernails clean and cut short. ? Wear gloves or mittens while you sleep, if scratching is a problem. General instructions  Rest as told by your health care provider.  Keep all follow-up visits as told by your health care provider. This is important.  Wash your hands often with soap and water. If soap and water are not available, use hand sanitizer. Doing this lowers your chance of getting a bacterial skin infection.  Before your blisters change into scabs, your shingles infection can cause chickenpox in people who have never had it or have never been vaccinated against it. To prevent this from happening, avoid contact with other people, especially: ? Babies. ? Pregnant women. ? Children who have eczema. ? Elderly people who have transplants. ? People who have chronic illnesses, such as cancer or AIDS. Contact a health care provider if:  Your pain is not relieved with prescribed medicines.  Your pain does not get better after the rash heals.  You have signs of infection in the rash area, such as: ? More redness, swelling, or pain around the rash. ? Fluid or blood coming from the rash. ? The rash area feeling warm to the touch. ? Pus or a bad smell coming from the rash. Get help right away if:  The rash is on your face or nose.  You have facial pain, pain around your eye area, or loss of feeling on one side of your face.  You have difficulty seeing.  You have ear pain or have ringing in your ear.  You have a loss of taste.  Your condition gets worse. Summary  Shingles, which is also known as herpes zoster, is an infection that causes a painful skin rash and fluid-filled blisters.  This condition is diagnosed with a skin exam. Skin or fluid samples may be taken from the blisters and examined before the diagnosis is made.  Keep your rash covered with a loose bandage (dressing). Wear  loose-fitting clothing to help ease the pain of material rubbing against the rash.  Before your blisters change into scabs, your shingles infection can cause chickenpox in people who have never had it or have never been vaccinated against it. This information is not intended to replace advice given to you by your health care provider. Make sure you discuss any questions you have with your health care provider. Document Released: 09/10/2005 Document Revised: 01/02/2019 Document Reviewed: 05/15/2017 Elsevier Patient Education  2020 [employer].

## 2019-09-09 ENCOUNTER — Ambulatory Visit (INDEPENDENT_AMBULATORY_CARE_PROVIDER_SITE_OTHER): Payer: BC Managed Care – PPO | Admitting: Psychiatry

## 2019-09-09 ENCOUNTER — Encounter: Payer: Self-pay | Admitting: Psychiatry

## 2019-09-09 DIAGNOSIS — F4312 Post-traumatic stress disorder, chronic: Secondary | ICD-10-CM | POA: Diagnosis not present

## 2019-09-09 DIAGNOSIS — F3176 Bipolar disorder, in full remission, most recent episode depressed: Secondary | ICD-10-CM | POA: Diagnosis not present

## 2019-09-09 MED ORDER — ARIPIPRAZOLE 20 MG PO TABS: 20.0000 mg | ORAL_TABLET | Freq: Every day | ORAL | 0 refills | Status: DC

## 2019-09-09 MED ORDER — CLONAZEPAM 0.5 MG PO TABS: 0.5000 mg | ORAL_TABLET | Freq: Every day | ORAL | 2 refills | Status: DC

## 2019-09-09 MED ORDER — TOPIRAMATE 100 MG PO TABS: 100.0000 mg | ORAL_TABLET | Freq: Every day | ORAL | 0 refills | Status: DC

## 2019-09-09 MED ORDER — DESVENLAFAXINE SUCCINATE ER 100 MG PO TB24: 100.0000 mg | ORAL_TABLET | Freq: Every day | ORAL | 0 refills | Status: DC

## 2019-09-09 NOTE — Progress Notes (Signed)
[place] MD OP Progress Note  I connected with  Heather Jefferson on 09/09/19 by a video enabled telemedicine application and verified that I am speaking with the correct person using two identifiers.   I discussed the limitations of evaluation and management by telemedicine. The patient expressed understanding and agreed to proceed.   09/09/2019 8:36 AM Heather Jefferson  MRN:  [id number]  Chief Complaint:  "I am doing better."  HPI: This is a 48 y/o female with hx of Bipolar disorder and chronic PTSD now seen for follow up. Pt reported that she is doing well on the 100 mg dose of Pristiq. She feels things are progressing well. Her mood has been stable. She is still taking Contrave for weight loss and recently noticed losing 5 lbs. She denied any other issues or concerns.  Visit Diagnosis: Chronic post-traumatic stress disorder (PTSD)  Bipolar 1 disorder, depressed, full remission ([place])   Past Psychiatric History: Chronic post-traumatic stress disorder (PTSD)  Bipolar 1 disorder, depressed, full remission ([place])   Past Medical History:  Past Medical History:  Diagnosis Date  . ADHD (attention deficit hyperactivity disorder)   . Allergy   . Anemia   . Anxiety   . Depression   . Diabetes mellitus, type II ([place])   . Fibromyalgia   . Generalized anxiety disorder   . Headache   . HIV infection ([place])   . Hypertension   . Major depressive disorder, recurrent episode, moderate ([place])   . Migraine with acute onset aura   . Persistent disorder of initiating or maintaining sleep   . PTSD (post-traumatic stress disorder)   . Restless leg   . Seizure disorder ([place])   . Thyroid disease   . Vitamin D deficiency     Past Surgical History:  Procedure Laterality Date  . EYE SURGERY    . TUBAL LIGATION      Family Psychiatric History: depression, anxiety in siblings  Family History:  Family History  Problem Relation Age of Onset  . Hypertension Mother   . Heart attack Mother    . CAD Mother   . Diabetes Father   . Hypertension Father   . Hypertension Sister   . Anxiety disorder Brother   . Depression Brother     Social History:  Social History   Socioeconomic History  . Marital status: Married    Spouse name: Heather Jefferson  . Number of children: 2  . Years of education: Not on file  . Highest education level: 10th grade  Occupational History  . Occupation: disabled  Tobacco Use  . Smoking status: Never Smoker  . Smokeless tobacco: Never Used  Substance and Sexual Activity  . Alcohol use: Yes    Alcohol/week: 0.0 standard drinks    Comment: social drinker  . Drug use: No  . Sexual activity: Not Currently    Partners: Male    Birth control/protection: Other-see comments, I.U.D.    Comment: husband has ED  Other Topics Concern  . 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 Difficulty of Paying Living Expenses: Not on file  Food Insecurity:   . Worried About Charity fundraiser in the Last Year: Not on file  . Ran Out of Food in the Last Year: Not on file  Transportation Needs:   .  Lack of Transportation (Medical): Not on file  . Lack of Transportation (Non-Medical): Not on file  Physical Activity:   . Days of Exercise per Week: Not on file  . Minutes of Exercise per Session: Not on file  Stress: Stress Concern Present  . Feeling of Stress : Rather much  Social Connections: Unknown  . Frequency of Communication with Friends and Family: More than three times a week  . Frequency of Social Gatherings with Friends and Family: Not on file  . Attends Religious Services: Not on file  . Active Member of Clubs or Organizations: Not on file  . Attends Archivist Meetings: Not on file  . Marital Status: Not on file    Allergies:  Allergies  Allergen Reactions  .  Amoxicillin-Pot Clavulanate Hives  . Losartan Cough  . Metformin And Related Diarrhea and Nausea And Vomiting    Metabolic Disorder Labs: Lab Results  Component Value Date   HGBA1C 5.2 05/04/2019   MPG 103 05/04/2019   MPG 123 10/18/2017   No results found for: PROLACTIN Lab Results  Component Value Date   CHOL 200 (H) 05/04/2019   TRIG 156 (H) 05/04/2019   HDL 48 (L) 05/04/2019   CHOLHDL 4.2 05/04/2019   VLDL 38 (H) 10/18/2016   LDLCALC 125 (H) 05/04/2019   LDLCALC 115 (H) 07/14/2018   Lab Results  Component Value Date   TSH 2.22 05/04/2019   TSH 1.340 05/07/2018    Therapeutic Level Labs: No results found for: LITHIUM No results found for: VALPROATE No components found for:  CBMZ  Current Medications: Current Outpatient Medications  Medication Sig Dispense Refill  . amLODipine (NORVASC) 5 MG tablet Take 5 mg by mouth daily.    . ARIPiprazole (ABILIFY) 20 MG tablet Take 1 tablet (20 mg total) by mouth daily. 90 tablet 1  . clonazePAM (KLONOPIN) 0.5 MG tablet TAKE 1 TABLET BY MOUTH AT BEDTIME 30 tablet 0  . desvenlafaxine (PRISTIQ) 100 MG 24 hr tablet Take 1 tablet (100 mg total) by mouth daily. 90 tablet 1  . hydroxychloroquine (PLAQUENIL) 200 MG tablet Take 200 mg by mouth 2 (two) times daily.    Marland Kitchen levothyroxine (SYNTHROID) 25 MCG tablet TAKE 1 TABLET DAILY AND 2 TABLETS (50 MCG)  ON SUNDAYS 102 tablet 0  . MIRENA, 52 MG, 20 MCG/24HR IUD     . Naltrexone-buPROPion HCl ER (CONTRAVE) 8-90 MG TB12 Take 2 tablets by mouth 2 (two) times daily. 120 tablet 2  . prednisoLONE acetate (PRED FORTE) 1 % ophthalmic suspension Place 1 drop into the right eye daily.     . pregabalin (LYRICA) 50 MG capsule Take 1 capsule (50 mg total) by mouth 3 (three) times daily. 90 capsule 0  . Semaglutide, 1 MG/DOSE, (OZEMPIC, 1 MG/DOSE,) 2 MG/1.5ML SOPN Inject 1 mg into the skin once a week. 3 mL 2  . SUMAtriptan (IMITREX) 20 MG/ACT nasal spray Place 1 spray (20 mg total) into the nose every 2  (two) hours as needed for migraine. 3 Inhaler 2  . topiramate (TOPAMAX) 100 MG tablet Take 1 tablet (100 mg total) by mouth daily. 90 tablet 1  . triamcinolone ointment (KENALOG) 0.1 % Apply 1 application topically 2 (two) times daily. For up to one week 30 g 0  . valACYclovir (VALTREX) 1000 MG tablet Take 1 tablet (1,000 mg total) by mouth 2 (two) times daily for 7 days. 14 tablet 0  . Vitamin D, Ergocalciferol, (DRISDOL) 1.25 MG (50000 UT) CAPS capsule  Take 1 capsule (50,000 Units total) by mouth every 7 (seven) days. 12 capsule 1  . [START ON 10/09/2019] Zoster Vaccine Adjuvanted [facility]) injection Inject 0.5 mLs into the muscle once for 1 dose. And repeat once more in 2-6 months 0.5 mL 1   No current facility-administered medications for this visit.     Musculoskeletal: Strength & Muscle Tone: Unable to assess due to telemed visit [place]: Unable to assess due to telemed visit Patient leans: Unable to assess due to telemed visit  Psychiatric Specialty Exam: ROS  There were no vitals taken for this visit.There is no height or weight on file to calculate BMI.  General Appearance: Well Groomed  Eye Contact:  Good  Speech:  Clear and Coherent  Volume:  Normal  Mood:  Euthymic  Affect:  Congruent  Thought Process:  Goal Directed, Linear and Descriptions of Associations: Intact  Orientation:  Full (Time, Place, and Person)  Thought Content: Logical   Suicidal Thoughts:  No  Homicidal Thoughts:  No  Memory:  Recent;   Good Remote;   Good  Judgement:  Fair  Insight:  Fair  Psychomotor Activity:  Negative  Concentration:  Concentration: Good and Attention Span: Good  Recall:  Good  Fund of Knowledge: Good  Language: Good  Akathisia:  No  Handed:  Right  AIMS (if indicated): not done  Assets:  Communication Skills Desire for Improvement Financial Resources/Insurance Housing Social Support Transportation  ADL's:  Intact  Cognition: WNL  Sleep:  Good    Screenings: GAD-7     Office Visit from 08/04/2019 in [facility] Office Visit from 07/08/2018 in [facility] Office Visit from 05/07/2018 in [facility] Office Visit from 02/04/2018 in [facility] Office Visit from 04/16/2016 in [facility]  Total GAD-7 Score  16  16  6  14  19     PHQ2-9     Office Visit from 09/08/2019 in [facility] Office Visit from 08/04/2019 in [facility] Office Visit from 05/04/2019 in [facility] Office Visit from 03/19/2019 in [facility] Office Visit from 01/14/2019 in [facility]  PHQ-2 Total Score  1  3  4  3  5   PHQ-9 Total Score  1  16  15  15  14        Assessment and Plan: Pt doing well on her current combination of medications.  1. Chronic post-traumatic stress disorder (PTSD)  - desvenlafaxine (PRISTIQ) 100 MG 24 hr tablet; Take 1 tablet (100 mg total) by mouth daily.  Dispense: 90 tablet; Refill: 0 - ARIPiprazole (ABILIFY) 20 MG tablet; Take 1 tablet (20 mg total) by mouth daily.  Dispense: 90 tablet; Refill: 0 - topiramate (TOPAMAX) 100 MG tablet; Take 1 tablet (100 mg total) by mouth daily.  Dispense: 90 tablet; Refill: 0  2. Bipolar 1 disorder, depressed, full remission (HCC)  - desvenlafaxine (PRISTIQ) 100 MG 24 hr tablet; Take 1 tablet (100 mg total) by mouth daily.  Dispense: 90 tablet; Refill: 0 - ARIPiprazole (ABILIFY) 20 MG tablet; Take 1 tablet (20 mg total) by mouth daily.  Dispense: 90 tablet; Refill: 0 - topiramate (TOPAMAX) 100 MG tablet; Take 1 tablet (100 mg total) by mouth daily.  Dispense: 90 tablet; Refill: 0 - clonazePAM (KLONOPIN) 0.5 MG tablet; Take 1 tablet (0.5 mg total) by mouth at bedtime.  Dispense: 30 tablet; Refill: 2  Continue same  medication regimen. Follow up in 3 months.   [place] Crane, MD 09/09/2019, 8:36 AM

## 2019-09-27 ENCOUNTER — Other Ambulatory Visit: Payer: Self-pay | Admitting: Family Medicine

## 2019-09-27 DIAGNOSIS — E039 Hypothyroidism, unspecified: Secondary | ICD-10-CM

## 2019-10-02 ENCOUNTER — Other Ambulatory Visit: Payer: Self-pay | Admitting: Family Medicine

## 2019-10-02 DIAGNOSIS — M797 Fibromyalgia: Secondary | ICD-10-CM

## 2019-10-29 DIAGNOSIS — E876 Hypokalemia: Secondary | ICD-10-CM | POA: Diagnosis not present

## 2019-10-29 DIAGNOSIS — R600 Localized edema: Secondary | ICD-10-CM | POA: Diagnosis not present

## 2019-10-29 DIAGNOSIS — I10 Essential (primary) hypertension: Secondary | ICD-10-CM | POA: Diagnosis not present

## 2019-11-04 ENCOUNTER — Other Ambulatory Visit: Payer: Self-pay

## 2019-11-04 ENCOUNTER — Encounter: Payer: Self-pay | Admitting: Family Medicine

## 2019-11-04 ENCOUNTER — Ambulatory Visit (INDEPENDENT_AMBULATORY_CARE_PROVIDER_SITE_OTHER): Payer: Medicare Other | Admitting: Family Medicine

## 2019-11-04 VITALS — BP 122/81 | Wt 265.0 lb

## 2019-11-04 DIAGNOSIS — F3160 Bipolar disorder, current episode mixed, unspecified: Secondary | ICD-10-CM

## 2019-11-04 DIAGNOSIS — E559 Vitamin D deficiency, unspecified: Secondary | ICD-10-CM | POA: Diagnosis not present

## 2019-11-04 DIAGNOSIS — G43009 Migraine without aura, not intractable, without status migrainosus: Secondary | ICD-10-CM

## 2019-11-04 DIAGNOSIS — M797 Fibromyalgia: Secondary | ICD-10-CM

## 2019-11-04 DIAGNOSIS — E039 Hypothyroidism, unspecified: Secondary | ICD-10-CM

## 2019-11-04 DIAGNOSIS — E8881 Metabolic syndrome: Secondary | ICD-10-CM

## 2019-11-04 DIAGNOSIS — I10 Essential (primary) hypertension: Secondary | ICD-10-CM

## 2019-11-04 DIAGNOSIS — M059 Rheumatoid arthritis with rheumatoid factor, unspecified: Secondary | ICD-10-CM

## 2019-11-04 MED ORDER — PREGABALIN 50 MG PO CAPS: 50.0000 mg | ORAL_CAPSULE | Freq: Three times a day (TID) | ORAL | 0 refills | Status: DC

## 2019-11-04 MED ORDER — VITAMIN D (ERGOCALCIFEROL) 1.25 MG (50000 UNIT) PO CAPS: 50000.0000 [IU] | ORAL_CAPSULE | ORAL | 1 refills | Status: DC

## 2019-11-04 MED ORDER — CONTRAVE 8-90 MG PO TB12: 2.0000 | ORAL_TABLET | Freq: Two times a day (BID) | ORAL | 2 refills | Status: DC

## 2019-11-04 MED ORDER — SUMATRIPTAN 20 MG/ACT NA SOLN: 20.0000 mg | NASAL | 2 refills | Status: DC | PRN
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
## 2019-11-20 DIAGNOSIS — M059 Rheumatoid arthritis with rheumatoid factor, unspecified: Secondary | ICD-10-CM | POA: Diagnosis not present

## 2019-11-20 DIAGNOSIS — M25511 Pain in right shoulder: Secondary | ICD-10-CM | POA: Diagnosis not present

## 2019-11-20 DIAGNOSIS — M797 Fibromyalgia: Secondary | ICD-10-CM | POA: Diagnosis not present

## 2019-11-20 DIAGNOSIS — Z79899 Other long term (current) drug therapy: Secondary | ICD-10-CM | POA: Diagnosis not present

## 2019-11-25 DIAGNOSIS — H04123 Dry eye syndrome of bilateral lacrimal glands: Secondary | ICD-10-CM | POA: Diagnosis not present

## 2019-12-02 ENCOUNTER — Ambulatory Visit (INDEPENDENT_AMBULATORY_CARE_PROVIDER_SITE_OTHER): Payer: Medicare Other | Admitting: Psychiatry

## 2019-12-02 ENCOUNTER — Ambulatory Visit: Payer: Medicare Other | Admitting: Psychiatry

## 2019-12-02 ENCOUNTER — Encounter: Payer: Self-pay | Admitting: Psychiatry

## 2019-12-02 ENCOUNTER — Other Ambulatory Visit: Payer: Self-pay

## 2019-12-02 DIAGNOSIS — F3130 Bipolar disorder, current episode depressed, mild or moderate severity, unspecified: Secondary | ICD-10-CM | POA: Diagnosis not present

## 2019-12-02 DIAGNOSIS — F4312 Post-traumatic stress disorder, chronic: Secondary | ICD-10-CM | POA: Diagnosis not present

## 2019-12-02 NOTE — Progress Notes (Signed)
[facility] MD OP Progress Note  I connected with  Heather Jefferson on 12/02/19 by a video enabled telemedicine application and verified that I am speaking with the correct person using two identifiers.   I discussed the limitations of evaluation and management by telemedicine. The patient expressed understanding and agreed to proceed.   12/02/2019 10:37 AM Heather Jefferson  MRN:  [id number]  Chief Complaint:  "I have been crying a lot lately."  HPI: This is a 49 y/o female with hx of Bipolar disorder and chronic PTSD now seen for follow up.  Patient reported that she has been crying a lot lately and has been feeling more depressed than usual.  Upon probing she reported that she recently found out that the person that she thought was her father was actually not her biological father.  She stated that she had known him since age of 42 and buried him last year.  She stated recently when she found out that he may not have been her biological father she has felt very sad and helpless.  She found out that she has a sister who shares the same genetic make-up as her.  She stated a lot of family numbers undergoing DNA tests and she is looking for closure in regards to this. She stated that she thinks maybe once this stress is done and over with she may feel better so she would like to continue her regimen the same way for now.   Visit Diagnosis: Bipolar I disorder, most recent episode depressed ([place])  Chronic post-traumatic stress disorder (PTSD)   Past Psychiatric History: Bipolar I disorder, most recent episode depressed ([place])  Chronic post-traumatic stress disorder (PTSD)   Past Medical History:  Past Medical History:  Diagnosis Date  . ADHD (attention deficit hyperactivity disorder)   . Allergy   . Anemia   . Anxiety   . Depression   . Diabetes mellitus, type II ([place])   . Fibromyalgia   . Generalized anxiety disorder   . Headache   . HIV infection ([place])   . Hypertension   . Major  depressive disorder, recurrent episode, moderate ([place])   . Migraine with acute onset aura   . Persistent disorder of initiating or maintaining sleep   . PTSD (post-traumatic stress disorder)   . Restless leg   . Seizure disorder ([place])   . Thyroid disease   . Vitamin D deficiency     Past Surgical History:  Procedure Laterality Date  . EYE SURGERY    . TUBAL LIGATION      Family Psychiatric History: depression, anxiety in siblings  Family History:  Family History  Problem Relation Age of Onset  . Hypertension Mother   . Heart attack Mother   . CAD Mother   . Diabetes Father   . Hypertension Father   . Hypertension Sister   . Anxiety disorder Brother   . Depression Brother     Social History:  Social History   Socioeconomic History  . Marital status: Married    Spouse name: roberto  . Number of children: 2  . Years of education: Not on file  . Highest education level: 10th grade  Occupational History  . Occupation: disabled  Tobacco Use  . Smoking status: Never Smoker  . Smokeless tobacco: Never Used  Substance and Sexual Activity  . Alcohol use: Yes    Alcohol/week: 0.0 standard drinks    Comment: social drinker  . Drug use: No  . Sexual activity: Not  Currently    Partners: Male    Birth control/protection: Other-see comments, I.U.D.    Comment: husband has ED  Other Topics Concern  . 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 Difficulty of Paying Living Expenses: Not on file  Food Insecurity:   . Worried About Charity fundraiser in the Last Year: Not on file  . Ran Out of Food in the Last Year: Not on file  Transportation Needs:   . Lack of Transportation (Medical): Not on file  . Lack of Transportation (Non-Medical): Not on file  Physical Activity:   . Days of Exercise per Week:  Not on file  . Minutes of Exercise per Session: Not on file  Stress:   . Feeling of Stress : Not on file  Social Connections:   . Frequency of Communication with Friends and Family: Not on file  . Frequency of Social Gatherings with Friends and Family: Not on file  . Attends Religious Services: Not on file  . Active Member of Clubs or Organizations: Not on file  . Attends Archivist Meetings: Not on file  . Marital Status: Not on file    Allergies:  Allergies  Allergen Reactions  . Amoxicillin-Pot Clavulanate Hives  . Losartan Cough  . Metformin And Related Diarrhea and Nausea And Vomiting    Metabolic Disorder Labs: Lab Results  Component Value Date   HGBA1C 5.2 05/04/2019   MPG 103 05/04/2019   MPG 123 10/18/2017   No results found for: PROLACTIN Lab Results  Component Value Date   CHOL 200 (H) 05/04/2019   TRIG 156 (H) 05/04/2019   HDL 48 (L) 05/04/2019   CHOLHDL 4.2 05/04/2019   VLDL 38 (H) 10/18/2016   LDLCALC 125 (H) 05/04/2019   LDLCALC 115 (H) 07/14/2018   Lab Results  Component Value Date   TSH 2.22 05/04/2019   TSH 1.340 05/07/2018    Therapeutic Level Labs: No results found for: LITHIUM No results found for: VALPROATE No components found for:  CBMZ  Current Medications: Current Outpatient Medications  Medication Sig Dispense Refill  . amLODipine (NORVASC) 5 MG tablet Take 5 mg by mouth daily.    . ARIPiprazole (ABILIFY) 20 MG tablet Take 1 tablet (20 mg total) by mouth daily. 90 tablet 0  . clonazePAM (KLONOPIN) 0.5 MG tablet Take 1 tablet (0.5 mg total) by mouth at bedtime. 30 tablet 2  . desvenlafaxine (PRISTIQ) 100 MG 24 hr tablet Take 1 tablet (100 mg total) by mouth daily. 90 tablet 0  . hydroxychloroquine (PLAQUENIL) 200 MG tablet Take 200 mg by mouth 2 (two) times daily.    Marland Kitchen levothyroxine (SYNTHROID) 25 MCG tablet TAKE 1 TABLET DAILY AND 2 TABLETS (50 MCG)  ON SUNDAYS 102 tablet 0  . MIRENA, 52 MG, 20 MCG/24HR IUD     .  Naltrexone-buPROPion HCl ER (CONTRAVE) 8-90 MG TB12 Take 2 tablets by mouth 2 (two) times daily. 120 tablet 2  . prednisoLONE acetate (PRED FORTE) 1 % ophthalmic suspension Place 1 drop into the right eye daily.     . pregabalin (LYRICA) 50 MG capsule Take 1 capsule (50 mg total) by mouth 3 (three) times daily. 270 capsule 0  . Semaglutide, 1 MG/DOSE, (OZEMPIC, 1 MG/DOSE,) 2 MG/1.5ML SOPN Inject 1 mg into the  skin once a week. 3 mL 2  . SUMAtriptan (IMITREX) 20 MG/ACT nasal spray Place 1 spray (20 mg total) into the nose every 2 (two) hours as needed for migraine. 3 Inhaler 2  . topiramate (TOPAMAX) 100 MG tablet Take 1 tablet (100 mg total) by mouth daily. 90 tablet 0  . triamcinolone ointment (KENALOG) 0.1 % Apply 1 application topically 2 (two) times daily. For up to one week 30 g 0  . Vitamin D, Ergocalciferol, (DRISDOL) 1.25 MG (50000 UNIT) CAPS capsule Take 1 capsule (50,000 Units total) by mouth every 7 (seven) days. 12 capsule 1   No current facility-administered medications for this visit.     Musculoskeletal: Strength & Muscle Tone: Unable to assess due to telemed visit Mineral: Unable to assess due to telemed visit Patient leans: Unable to assess due to telemed visit  Psychiatric Specialty Exam: ROS  There were no vitals taken for this visit.There is no height or weight on file to calculate BMI.  General Appearance: Well Groomed  Eye Contact:  Good  Speech:  Clear and Coherent  Volume:  Normal  Mood:  Depressed  Affect:  Sad  Thought Process:  Goal Directed, Linear and Descriptions of Associations: Intact  Orientation:  Full (Time, Place, and Person)  Thought Content: Logical   Suicidal Thoughts:  No  Homicidal Thoughts:  No  Memory:  Recent;   Good Remote;   Good  Judgement:  Fair  Insight:  Fair  Psychomotor Activity:  Negative  Concentration:  Concentration: Good and Attention Span: Good  Recall:  Good  Fund of Knowledge: Good  Language: Good  Akathisia:   No  Handed:  Right  AIMS (if indicated): not done  Assets:  Communication Skills Desire for Improvement Financial Resources/Insurance Housing Social Support Transportation  ADL's:  Intact  Cognition: WNL  Sleep:  Good   Screenings: GAD-7     Office Visit from 08/04/2019 in [facility] Office Visit from 07/08/2018 in [facility] Office Visit from 05/07/2018 in [facility] Office Visit from 02/04/2018 in [facility] Office Visit from 04/16/2016 in [facility]  Total GAD-7 Score  16  16  6  14  19     PHQ2-9     Office Visit from 11/04/2019 in [facility] Office Visit from 09/08/2019 in [facility] Office Visit from 08/04/2019 in [facility] Office Visit from 05/04/2019 in [facility] Office Visit from 03/19/2019 in [facility]  PHQ-2 Total Score  3  1  3  4  3   PHQ-9 Total Score  20  1  16  15  15        Assessment and Plan: Pt undergoing stress due to ongoing life circumstances.  We will continue the same regimen for now and touch base in 2 months to see how she is doing.  1. Chronic post-traumatic stress disorder (PTSD)  - desvenlafaxine (PRISTIQ) 100 MG 24 hr tablet; Take 1 tablet (100 mg total) by mouth daily.  Dispense: 90 tablet; Refill: 0 - ARIPiprazole (ABILIFY) 20 MG tablet; Take 1 tablet (20 mg total) by mouth daily.  Dispense: 90 tablet; Refill: 0 - topiramate (TOPAMAX) 100 MG tablet; Take 1 tablet (100 mg total) by mouth daily.  Dispense: 90 tablet; Refill: 0  2. Bipolar 1 disorder, depressed, full remission (H[facility])  - desvenlafaxine (PRISTIQ) 100 MG 24 hr tablet; Take 1 tablet (  100 mg total) by mouth daily.  Dispense: 90 tablet; Refill: 0 - ARIPiprazole (ABILIFY) 20 MG tablet; Take 1 tablet (20 mg total) by mouth daily.  Dispense: 90 tablet; Refill: 0 - topiramate (TOPAMAX) 100 MG  tablet; Take 1 tablet (100 mg total) by mouth daily.  Dispense: 90 tablet; Refill: 0 - clonazePAM (KLONOPIN) 0.5 MG tablet; Take 1 tablet (0.5 mg total) by mouth at bedtime.  Dispense: 30 tablet; Refill: 2  Continue same medication regimen. Follow up in 2 months.   [place] Crane, MD 12/02/2019, 10:37 AM

## 2019-12-03 ENCOUNTER — Other Ambulatory Visit: Payer: Self-pay

## 2019-12-03 ENCOUNTER — Telehealth: Payer: Self-pay

## 2019-12-03 ENCOUNTER — Ambulatory Visit (INDEPENDENT_AMBULATORY_CARE_PROVIDER_SITE_OTHER): Payer: Medicare Other

## 2019-12-03 VITALS — BP 109/95 | HR 81 | Ht 62.0 in | Wt 253.0 lb

## 2019-12-03 DIAGNOSIS — Z0000 Encounter for general adult medical examination without abnormal findings: Secondary | ICD-10-CM | POA: Diagnosis not present

## 2019-12-03 DIAGNOSIS — Z1231 Encounter for screening mammogram for malignant neoplasm of breast: Secondary | ICD-10-CM | POA: Diagnosis not present

## 2019-12-03 DIAGNOSIS — Z1211 Encounter for screening for malignant neoplasm of colon: Secondary | ICD-10-CM | POA: Diagnosis not present

## 2019-12-03 NOTE — Patient Instructions (Signed)
Heather Jefferson , Thank you for taking time to come for your Medicare Wellness Visit. I appreciate your ongoing commitment to your health goals. Please review the following plan we discussed and let me know if I can assist you in the future.   Screening recommendations/referrals: Colonoscopy: Referral sent to [facility] today for screening colonoscopy. They will contact you for an appointment.  Mammogram: done 10/08/18. Please call [phone] to schedule your mammogram.   Recommended yearly ophthalmology/optometry visit for glaucoma screening and checkup Recommended yearly dental visit for hygiene and checkup  Vaccinations: Influenza vaccine: done 08/04/19 Pneumococcal vaccine: done 05/25/13 Tdap vaccine: done 09/30/18 Shingles vaccine: Shingrix discussed. Please contact your pharmacy for coverage information.   Advanced directives: Advance directive discussed with you today. I have provided a copy for you to complete at home and have notarized. Once this is complete please bring a copy in to our office so we can scan it into your chart.  Conditions/risks identified: Recommend healthy eating and physical activity for desired weight loss.   Next appointment: Please follow up in one year for your Medicare Annual Wellness visit.    Preventive Care 40-64 Years, Female Preventive care refers to lifestyle choices and visits with your health care provider that can promote health and wellness. What does preventive care include?  A yearly physical exam. This is also called an annual well check.  Dental exams once or twice a year.  Routine eye exams. Ask your health care provider how often you should have your eyes checked.  Personal lifestyle choices, including:  Daily care of your teeth and gums.  Regular physical activity.  Eating a healthy diet.  Avoiding tobacco and drug use.  Limiting alcohol use.  Practicing safe sex.  Taking low-dose aspirin daily starting at age  29.  Taking vitamin and mineral supplements as recommended by your health care provider. What happens during an annual well check? The services and screenings done by your health care provider during your annual well check will depend on your age, overall health, lifestyle risk factors, and family history of disease. Counseling  Your health care provider may ask you questions about your:  Alcohol use.  Tobacco use.  Drug use.  Emotional well-being.  Home and relationship well-being.  Sexual activity.  Eating habits.  Work and work Statistician.  Method of birth control.  Menstrual cycle.  Pregnancy history. Screening  You may have the following tests or measurements:  Height, weight, and BMI.  Blood pressure.  Lipid and cholesterol levels. These may be checked every 5 years, or more frequently if you are over 45 years old.  Skin check.  Lung cancer screening. You may have this screening every year starting at age 45 if you have a 30-pack-year history of smoking and currently smoke or have quit within the past 15 years.  Fecal occult blood test (FOBT) of the stool. You may have this test every year starting at age 58.  Flexible sigmoidoscopy or colonoscopy. You may have a sigmoidoscopy every 5 years or a colonoscopy every 10 years starting at age 61.  Hepatitis C blood test.  Hepatitis B blood test.  Sexually transmitted disease (STD) testing.  Diabetes screening. This is done by checking your blood sugar (glucose) after you have not eaten for a while (fasting). You may have this done every 1-3 years.  Mammogram. This may be done every 1-2 years. Talk to your health care provider about when you should start having regular mammograms. This may depend  on whether you have a family history of breast cancer.  BRCA-related cancer screening. This may be done if you have a family history of breast, ovarian, tubal, or peritoneal cancers.  Pelvic exam and Pap test. This  may be done every 3 years starting at age 75. Starting at age 41, this may be done every 5 years if you have a Pap test in combination with an HPV test.  Bone density scan. This is done to screen for osteoporosis. You may have this scan if you are at high risk for osteoporosis. Discuss your test results, treatment options, and if necessary, the need for more tests with your health care provider. Vaccines  Your health care provider may recommend certain vaccines, such as:  Influenza vaccine. This is recommended every year.  Tetanus, diphtheria, and acellular pertussis (Tdap, Td) vaccine. You may need a Td booster every 10 years.  Zoster vaccine. You may need this after age 59.  Pneumococcal 13-valent conjugate (PCV13) vaccine. You may need this if you have certain conditions and were not previously vaccinated.  Pneumococcal polysaccharide (PPSV23) vaccine. You may need one or two doses if you smoke cigarettes or if you have certain conditions. Talk to your health care provider about which screenings and vaccines you need and how often you need them. This information is not intended to replace advice given to you by your health care provider. Make sure you discuss any questions you have with your health care provider. Document Released: 10/07/2015 Document Revised: 05/30/2016 Document Reviewed: 07/12/2015 Elsevier Interactive Patient Education  2017 [place] Prevention in the Home Falls can cause injuries. They can happen to people of all ages. There are many things you can do to make your home safe and to help prevent falls. What can I do on the outside of my home?  Regularly fix the edges of walkways and driveways and fix any cracks.  Remove anything that might make you trip as you walk through a door, such as a raised step or threshold.  Trim any bushes or trees on the path to your home.  Use bright outdoor lighting.  Clear any walking paths of anything that might  make someone trip, such as rocks or tools.  Regularly check to see if handrails are loose or broken. Make sure that both sides of any steps have handrails.  Any raised decks and porches should have guardrails on the edges.  Have any leaves, snow, or ice cleared regularly.  Use sand or salt on walking paths during winter.  Clean up any spills in your garage right away. This includes oil or grease spills. What can I do in the bathroom?  Use night lights.  Install grab bars by the toilet and in the tub and shower. Do not use towel bars as grab bars.  Use non-skid mats or decals in the tub or shower.  If you need to sit down in the shower, use a plastic, non-slip stool.  Keep the floor dry. Clean up any water that spills on the floor as soon as it happens.  Remove soap buildup in the tub or shower regularly.  Attach bath mats securely with double-sided non-slip rug tape.  Do not have throw rugs and other things on the floor that can make you trip. What can I do in the bedroom?  Use night lights.  Make sure that you have a light by your bed that is easy to reach.  Do not use any  sheets or blankets that are too big for your bed. They should not hang down onto the floor.  Have a firm chair that has side arms. You can use this for support while you get dressed.  Do not have throw rugs and other things on the floor that can make you trip. What can I do in the kitchen?  Clean up any spills right away.  Avoid walking on wet floors.  Keep items that you use a lot in easy-to-reach places.  If you need to reach something above you, use a strong step stool that has a grab bar.  Keep electrical cords out of the way.  Do not use floor polish or wax that makes floors slippery. If you must use wax, use non-skid floor wax.  Do not have throw rugs and other things on the floor that can make you trip. What can I do with my stairs?  Do not leave any items on the stairs.  Make sure  that there are handrails on both sides of the stairs and use them. Fix handrails that are broken or loose. Make sure that handrails are as long as the stairways.  Check any carpeting to make sure that it is firmly attached to the stairs. Fix any carpet that is loose or worn.  Avoid having throw rugs at the top or bottom of the stairs. If you do have throw rugs, attach them to the floor with carpet tape.  Make sure that you have a light switch at the top of the stairs and the bottom of the stairs. If you do not have them, ask someone to add them for you. What else can I do to help prevent falls?  Wear shoes that:  Do not have high heels.  Have rubber bottoms.  Are comfortable and fit you well.  Are closed at the toe. Do not wear sandals.  If you use a stepladder:  Make sure that it is fully opened. Do not climb a closed stepladder.  Make sure that both sides of the stepladder are locked into place.  Ask someone to hold it for you, if possible.  Clearly mark and make sure that you can see:  Any grab bars or handrails.  First and last steps.  Where the edge of each step is.  Use tools that help you move around (mobility aids) if they are needed. These include:  Canes.  Walkers.  Scooters.  Crutches.  Turn on the lights when you go into a dark area. Replace any light bulbs as soon as they burn out.  Set up your furniture so you have a clear path. Avoid moving your furniture around.  If any of your floors are uneven, fix them.  If there are any pets around you, be aware of where they are.  Review your medicines with your doctor. Some medicines can make you feel dizzy. This can increase your chance of falling. Ask your doctor what other things that you can do to help prevent falls. This information is not intended to replace advice given to you by your health care provider. Make sure you discuss any questions you have with your health care provider. Document  Released: 07/07/2009 Document Revised: 02/16/2016 Document Reviewed: 10/15/2014 Elsevier Interactive Patient Education  2017 [employer].

## 2019-12-03 NOTE — Progress Notes (Signed)
Subjective:   Heather Jefferson is a 49 y.o. female who presents for Medicare Annual (Subsequent) preventive examination.  Virtual Visit via Video Note  I connected with Heather Jefferson on 12/03/19 at  8:00 AM EST by a video enabled telemedicine application and verified that I am speaking with the correct person using two identifiers.  Annual wellness visit completed via virtual visit due to current Covid-19 pandemic.   Location: Patient: home Provider: office   I discussed the limitations of evaluation and management by telemedicine and the availability of in person appointments. The patient expressed understanding and agreed to proceed.  Some vital signs may be absent or patient reported.   Clemetine Marker, LPN    Review of Systems:   Cardiac Risk Factors include: obesity (BMI >30kg/m2);hypertension;diabetes mellitus     Objective:     Vitals: BP (!) 109/95   Pulse 81   Ht 5\' 2"  (1.575 m)   Wt 253 lb (114.8 kg)   BMI 46.27 kg/m   Body mass index is 46.27 kg/m.  Advanced Directives 12/03/2019 11/28/2018 10/16/2018 04/04/2018 08/26/2017 05/24/2017 04/15/2017  Does Patient Have a Medical Advance Directive? No No No Yes No No No  Does patient want to make changes to medical advance directive? - - - No - Patient declined - - -  Would patient like information on creating a medical advance directive? Yes (MAU/Ambulatory/Procedural Areas - Information given) No - Patient declined No - Patient declined - No - Patient declined Yes (MAU/Ambulatory/Procedural Areas - Information given) -  Some encounter information is confidential and restricted. Go to Review Flowsheets activity to see all data.    Tobacco Social History   Tobacco Use  Smoking Status Never Smoker  Smokeless Tobacco Never Used     Counseling given: Not Answered   Clinical Intake:  Pre-visit preparation completed: Yes  Pain : No/denies pain     BMI - recorded: 46.27 Nutritional Status: BMI > 30  Obese  Nutritional Risks: None Diabetes: No  How often do you need to have someone help you when you read instructions, pamphlets, or other written materials from your doctor or pharmacy?: 1 - Never  Interpreter Needed?: No  Information entered by :: Clemetine Marker LPN  Past Medical History:  Diagnosis Date  . ADHD (attention deficit hyperactivity disorder)   . Allergy   . Anemia   . Anxiety   . Depression   . Diabetes mellitus, type II ([place])   . Fibromyalgia   . Generalized anxiety disorder   . Headache   . HIV infection ([place])   . Hypertension   . Major depressive disorder, recurrent episode, moderate ([place])   . Migraine with acute onset aura   . Persistent disorder of initiating or maintaining sleep   . PTSD (post-traumatic stress disorder)   . Restless leg   . Seizure disorder ([place])   . Thyroid disease   . Vitamin D deficiency    Past Surgical History:  Procedure Laterality Date  . EYE SURGERY    . TUBAL LIGATION     Family History  Problem Relation Age of Onset  . Hypertension Mother   . Heart attack Mother   . CAD Mother   . Diabetes Father   . Hypertension Father   . Hypertension Sister   . Anxiety disorder Brother   . Depression Brother    Social History   Socioeconomic History  . Marital status: Married    Spouse name: roberto  . Number of  children: 2  . Years of education: Not on file  . Highest education level: 10th grade  Occupational History  . Occupation: disabled  Tobacco Use  . Smoking status: Never Smoker  . Smokeless tobacco: Never Used  Substance and Sexual Activity  . Alcohol use: Yes    Alcohol/week: 0.0 standard drinks  . Drug use: No  . Sexual activity: Not Currently    Partners: Male    Birth control/protection: Other-see comments, I.U.D.    Comment: husband has ED  Other Topics Concern  . 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 Difficulty of Paying Living Expenses: Not hard at all  Food Insecurity: No Food Insecurity  . Worried About Charity fundraiser in the Last Year: Never true  . Ran Out of Food in the Last Year: Never true  Transportation Needs: No Transportation Needs  . Lack of Transportation (Medical): No  . Lack of Transportation (Non-Medical): No  Physical Activity: Inactive  . Days of Exercise per Week: 0 days  . Minutes of Exercise per Session: 0 min  Stress: Stress Concern Present  . Feeling of Stress : Rather much  Social Connections: Not Isolated  . Frequency of Communication with Friends and Family: More than three times a week  . Frequency of Social Gatherings with Friends and Family: Once a week  . Attends Religious Services: More than 4 times per year  . Active Member of Clubs or Organizations: Yes  . Attends Archivist Meetings: More than 4 times per year  . Marital Status: Married    Outpatient Encounter Medications as of 12/03/2019  Medication Sig  . amLODipine (NORVASC) 5 MG tablet Take 5 mg by mouth daily.  . ARIPiprazole (ABILIFY) 20 MG tablet Take 1 tablet (20 mg total) by mouth daily.  . clonazePAM (KLONOPIN) 0.5 MG tablet Take 1 tablet (0.5 mg total) by mouth at bedtime.  Marland Kitchen desvenlafaxine (PRISTIQ) 100 MG 24 hr tablet Take 1 tablet (100 mg total) by mouth daily.  . hydroxychloroquine (PLAQUENIL) 200 MG tablet Take 200 mg by mouth 2 (two) times daily.  Marland Kitchen levothyroxine (SYNTHROID) 25 MCG tablet TAKE 1 TABLET DAILY AND 2 TABLETS (50 MCG)  ON SUNDAYS  . MIRENA, 52 MG, 20 MCG/24HR IUD   . Naltrexone-buPROPion HCl ER (CONTRAVE) 8-90 MG TB12 Take 2 tablets by mouth 2 (two) times daily.  Marland Kitchen ofloxacin (OCUFLOX) 0.3 % ophthalmic solution Place 1 drop into the right eye 4 (four) times daily.  . prednisoLONE acetate (PRED FORTE) 1 % ophthalmic suspension Place 1 drop into the right eye daily.   .  pregabalin (LYRICA) 50 MG capsule Take 1 capsule (50 mg total) by mouth 3 (three) times daily.  . Semaglutide, 1 MG/DOSE, (OZEMPIC, 1 MG/DOSE,) 2 MG/1.5ML SOPN Inject 1 mg into the skin once a week.  . SUMAtriptan (IMITREX) 20 MG/ACT nasal spray Place 1 spray (20 mg total) into the nose every 2 (two) hours as needed for migraine.  . topiramate (TOPAMAX) 100 MG tablet Take 1 tablet (100 mg total) by mouth daily.  . Vitamin D, Ergocalciferol, (DRISDOL) 1.25 MG (50000 UNIT) CAPS capsule Take 1 capsule (50,000 Units total) by mouth every 7 (seven) days.  . [DISCONTINUED] ARIPiprazole (ABILIFY) 20 MG tablet Take 1 tablet (20 mg total)  by mouth daily.  . [DISCONTINUED] clonazePAM (KLONOPIN) 0.5 MG tablet Take 1 tablet (0.5 mg total) by mouth at bedtime.  . [DISCONTINUED] desvenlafaxine (PRISTIQ) 50 MG 24 hr tablet Take 1 tablet (50 mg total) by mouth daily.  . [DISCONTINUED] levothyroxine (SYNTHROID) 25 MCG tablet TAKE 1 TABLET DAILY AND 2 TABLETS (50 MCG)  ON SUNDAYS  . [DISCONTINUED] Naltrexone-buPROPion HCl ER (CONTRAVE) 8-90 MG TB12 Take 2 tablets by mouth 2 (two) times daily.  . [DISCONTINUED] Semaglutide, 1 MG/DOSE, (OZEMPIC, 1 MG/DOSE,) 2 MG/1.5ML SOPN Inject 1 mg into the skin once a week.  . [DISCONTINUED] SUMAtriptan (IMITREX) 20 MG/ACT nasal spray Place 1 spray (20 mg total) into the nose every 2 (two) hours as needed for migraine.  . [DISCONTINUED] topiramate (TOPAMAX) 100 MG tablet Take 1 tablet (100 mg total) by mouth daily.  . [DISCONTINUED] triamcinolone ointment (KENALOG) 0.1 % Apply 1 application topically 2 (two) times daily. For up to one week  . [DISCONTINUED] Vitamin D, Ergocalciferol, (DRISDOL) 1.25 MG (50000 UT) CAPS capsule Take 1 capsule (50,000 Units total) by mouth every 7 (seven) days.   No facility-administered encounter medications on file as of 12/03/2019.    Activities of Daily Living In your present state of health, do you have any difficulty performing the following  activities: 12/03/2019 11/04/2019  Hearing? N N  Comment declines hearing aids -  Vision? N N  Difficulty concentrating or making decisions? Tempie Donning  Walking or climbing stairs? N N  Dressing or bathing? N N  Doing errands, shopping? N N  Preparing Food and eating ? N -  Using the Toilet? N -  In the past six months, have you accidently leaked urine? N -  Do you have problems with loss of bowel control? N -  Managing your Medications? N -  Managing your Finances? N -  Housekeeping or managing your Housekeeping? N -  Some recent data might be hidden    Patient Care Team: Steele Sizer, MD as PCP - General    Assessment:   This is a routine wellness examination for Heather Jefferson.  Exercise Activities and Dietary recommendations Current Exercise Habits: The patient does not participate in regular exercise at present, Exercise limited by: None identified  Goals    . Weight (lb) < 225 lb (102.1 kg)     Pt states she would like to lose 25 lbs over the next year with healthy eating and exercise       Fall Risk Fall Risk  12/03/2019 11/04/2019 09/08/2019 08/04/2019 05/04/2019  Falls in the past year? 1 1 0 0 0  Number falls in past yr: 1 0 0 0 0  Injury with Fall? 1 1 0 0 0  Comment - - - - -  Risk for fall due to : History of fall(s);Impaired balance/gait - - - -  Follow up Falls prevention discussed - - - -   FALL RISK PREVENTION PERTAINING TO THE HOME:  Any stairs in or around the home? Yes  If so, do they handrails? No   A few steps outside  Home free of loose throw rugs in walkways, pet beds, electrical cords, etc? Yes  Adequate lighting in your home to reduce risk of falls? Yes   ASSISTIVE DEVICES UTILIZED TO PREVENT FALLS:  Life alert? No  Use of a cane, walker or w/c? No  Grab bars in the bathroom? No  Shower chair or bench in shower? No  Elevated toilet seat or a handicapped toilet? No  DME ORDERS:  DME order needed?  No   TIMED UP AND GO:  Was the test performed?  No . Virtual visit.   Education: Fall risk prevention has been discussed.  Intervention(s) required? No    Depression Screen PHQ 2/9 Scores 12/03/2019 11/04/2019 09/08/2019 08/04/2019  PHQ - 2 Score 6 3 1 3   PHQ- 9 Score 24 20 1 16   Not completed - - - -     Cognitive Function     6CIT Screen 12/03/2019 11/28/2018  What Year? 0 points 0 points  What month? 0 points 0 points  What time? 0 points 0 points  Count back from 20 0 points 0 points  Months in reverse 0 points 0 points  Repeat phrase 4 points 0 points  Total Score 4 0    Immunization History  Administered Date(s) Administered  . Influenza,inj,Quad PF,6+ Mos 05/17/2015, 10/16/2017, 10/08/2018, 08/04/2019  . Influenza-Unspecified 07/14/2014  . Pneumococcal Polysaccharide-23 05/25/2013  . Tdap 12/30/2009, 09/30/2018    Qualifies for Shingles Vaccine? No  . Pt will be 49 in April.   Tdap: Up to date  Flu Vaccine: Up to date  Pneumococcal Vaccine: Up to date   Screening Tests Health Maintenance  Topic Date Due  . MAMMOGRAM  10/09/2019  . PAP SMEAR-Modifier  07/09/2023  . TETANUS/TDAP  09/30/2028  . INFLUENZA VACCINE  Completed  . HIV Screening  Completed    Cancer Screenings:  Colorectal Screening: Due this year. Referral to GI placed today. Pt aware the office will call re: appt.  Mammogram: Completed 10/08/18. Repeat every year. Ordered today. Pt provided with contact information and advised to call to schedule appt.   Bone Density: due age 54  Lung Cancer Screening: (Low Dose CT Chest recommended if Age 50-80 years, 30 pack-year currently smoking OR have quit w/in 15years.) does not qualify.    Additional Screening:  Hepatitis C Screening: does not qualify;   Vision Screening: Recommended annual ophthalmology exams for early detection of glaucoma and other disorders of the eye. Is the patient up to date with their annual eye exam?  Yes  Who is the provider or what is the name of the office in  which the pt attends annual eye exams? [facility]  Dental Screening: Recommended annual dental exams for proper oral hygiene  Community Resource Referral:  CRR required this visit?  No      Plan:     I have personally reviewed and addressed the Medicare Annual Wellness questionnaire and have noted the following in the patient's chart:  A. Medical and social history B. Use of alcohol, tobacco or illicit drugs  C. Current medications and supplements D. Functional ability and status E.  Nutritional status F.  Physical activity G. Advance directives H. List of other physicians I.  Hospitalizations, surgeries, and ER visits in previous 12 months J.  [place] such as hearing and vision if needed, cognitive and depression L. Referrals and appointments   In addition, I have reviewed and discussed with patient certain preventive protocols, quality metrics, and best practice recommendations. A written personalized care plan for preventive services as well as general preventive health recommendations were provided to patient.   Signed,  Clemetine Marker, LPN Nurse Health Advisor   Nurse Notes: PHQ 9 of 24. Pt states she has felt more anxious and depressed due to personal things going on in her life. She did have an appt with Dr. Toy Care yesterday and will follow up in 2  months to determine if any medication changes need to be made.

## 2019-12-03 NOTE — Telephone Encounter (Signed)
Gastroenterology Pre-Procedure Review  Request Date: May 7th, 2021 Requesting Physician: Dr. Vicente Males  PATIENT REVIEW QUESTIONS: The patient responded to the following health history questions as indicated:    1. Are you having any GI issues? no 2. Do you have a personal history of Polyps? no 3. Do you have a family history of Colon Cancer or Polyps? no 4. Diabetes Mellitus? no 5. Joint replacements in the past 12 months?no 6. Major health problems in the past 3 months?no 7. Any artificial heart valves, MVP, or defibrillator?no    MEDICATIONS & ALLERGIES:    Patient reports the following regarding taking any anticoagulation/antiplatelet therapy:   Plavix, Coumadin, Eliquis, Xarelto, Lovenox, Pradaxa, Brilinta, or Effient? no Aspirin? no  Patient confirms/reports the following medications:  Current Outpatient Medications  Medication Sig Dispense Refill  . amLODipine (NORVASC) 5 MG tablet Take 5 mg by mouth daily.    . ARIPiprazole (ABILIFY) 20 MG tablet Take 1 tablet (20 mg total) by mouth daily. 90 tablet 0  . clonazePAM (KLONOPIN) 0.5 MG tablet Take 1 tablet (0.5 mg total) by mouth at bedtime. 30 tablet 2  . desvenlafaxine (PRISTIQ) 100 MG 24 hr tablet Take 1 tablet (100 mg total) by mouth daily. 90 tablet 0  . hydroxychloroquine (PLAQUENIL) 200 MG tablet Take 200 mg by mouth 2 (two) times daily.    Marland Kitchen levothyroxine (SYNTHROID) 25 MCG tablet TAKE 1 TABLET DAILY AND 2 TABLETS (50 MCG)  ON SUNDAYS 102 tablet 0  . MIRENA, 52 MG, 20 MCG/24HR IUD     . Naltrexone-buPROPion HCl ER (CONTRAVE) 8-90 MG TB12 Take 2 tablets by mouth 2 (two) times daily. 120 tablet 2  . ofloxacin (OCUFLOX) 0.3 % ophthalmic solution Place 1 drop into the right eye 4 (four) times daily.    . prednisoLONE acetate (PRED FORTE) 1 % ophthalmic suspension Place 1 drop into the right eye daily.     . pregabalin (LYRICA) 50 MG capsule Take 1 capsule (50 mg total) by mouth 3 (three) times daily. 270 capsule 0  . Semaglutide,  1 MG/DOSE, (OZEMPIC, 1 MG/DOSE,) 2 MG/1.5ML SOPN Inject 1 mg into the skin once a week. 3 mL 2  . SUMAtriptan (IMITREX) 20 MG/ACT nasal spray Place 1 spray (20 mg total) into the nose every 2 (two) hours as needed for migraine. 3 Inhaler 2  . topiramate (TOPAMAX) 100 MG tablet Take 1 tablet (100 mg total) by mouth daily. 90 tablet 0  . Vitamin D, Ergocalciferol, (DRISDOL) 1.25 MG (50000 UNIT) CAPS capsule Take 1 capsule (50,000 Units total) by mouth every 7 (seven) days. 12 capsule 1   No current facility-administered medications for this visit.    Patient confirms/reports the following allergies:  Allergies  Allergen Reactions  . Amoxicillin-Pot Clavulanate Hives  . Losartan Cough  . Metformin And Related Diarrhea and Nausea And Vomiting    No orders of the defined types were placed in this encounter.   AUTHORIZATION INFORMATION Primary Insurance: 1D#: Group #:  Secondary Insurance: 1D#: Group #:  SCHEDULE INFORMATION: Date: Friday May 7th, 2021 Time: Location:[facility]

## 2019-12-04 ENCOUNTER — Other Ambulatory Visit: Payer: Self-pay | Admitting: Family Medicine

## 2019-12-04 DIAGNOSIS — E8881 Metabolic syndrome: Secondary | ICD-10-CM

## 2019-12-14 ENCOUNTER — Telehealth: Payer: Self-pay | Admitting: Family Medicine

## 2019-12-17 NOTE — Telephone Encounter (Signed)
Erroneous Entry  

## 2019-12-25 ENCOUNTER — Other Ambulatory Visit: Payer: Self-pay | Admitting: Family Medicine

## 2019-12-25 DIAGNOSIS — E039 Hypothyroidism, unspecified: Secondary | ICD-10-CM

## 2020-01-07 ENCOUNTER — Telehealth: Payer: Self-pay

## 2020-01-07 DIAGNOSIS — F4312 Post-traumatic stress disorder, chronic: Secondary | ICD-10-CM

## 2020-01-07 DIAGNOSIS — F3176 Bipolar disorder, in full remission, most recent episode depressed: Secondary | ICD-10-CM

## 2020-01-07 MED ORDER — CLONAZEPAM 0.5 MG PO TABS: 0.5000 mg | ORAL_TABLET | Freq: Every day | ORAL | 2 refills | Status: DC

## 2020-01-07 MED ORDER — DESVENLAFAXINE SUCCINATE ER 100 MG PO TB24: 100.0000 mg | ORAL_TABLET | Freq: Every day | ORAL | 0 refills | Status: DC

## 2020-01-07 NOTE — Telephone Encounter (Signed)
pt called states that she needs two medications sent to 2 [facility]part[facility] places. k[facility]ipin [facility] and the [facility].

## 2020-01-07 NOTE — Telephone Encounter (Signed)
Done

## 2020-01-18 NOTE — Progress Notes (Signed)
Patient is a 49 year old female patient of Dr. Ancil Boozer Her last follow-up office visit was 11/04/2019 She has a significant past medical history as noted in her active problem list. She presents today with

## 2020-01-19 ENCOUNTER — Ambulatory Visit (INDEPENDENT_AMBULATORY_CARE_PROVIDER_SITE_OTHER): Payer: Medicare Other | Admitting: Family Medicine

## 2020-01-19 ENCOUNTER — Encounter: Payer: Self-pay | Admitting: Family Medicine

## 2020-01-19 ENCOUNTER — Other Ambulatory Visit: Payer: Self-pay

## 2020-01-19 VITALS — BP 118/62 | HR 86 | Temp 97.3°F | Resp 16 | Ht 62.0 in | Wt 274.2 lb

## 2020-01-19 DIAGNOSIS — R58 Hemorrhage, not elsewhere classified: Secondary | ICD-10-CM

## 2020-01-19 DIAGNOSIS — I83891 Varicose veins of right lower extremities with other complications: Secondary | ICD-10-CM | POA: Diagnosis not present
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
## 2020-01-27 ENCOUNTER — Telehealth (INDEPENDENT_AMBULATORY_CARE_PROVIDER_SITE_OTHER): Payer: Medicare Other | Admitting: Psychiatry

## 2020-01-27 ENCOUNTER — Other Ambulatory Visit
Admission: RE | Admit: 2020-01-27 | Discharge: 2020-01-27 | Disposition: A | Discharge status: Home / Self Care | Payer: BC Managed Care – PPO | Source: Ambulatory Visit | Attending: Gastroenterology | Admitting: Gastroenterology

## 2020-01-27 ENCOUNTER — Encounter: Payer: Self-pay | Admitting: Psychiatry

## 2020-01-27 ENCOUNTER — Other Ambulatory Visit: Payer: Self-pay

## 2020-01-27 DIAGNOSIS — F3175 Bipolar disorder, in partial remission, most recent episode depressed: Secondary | ICD-10-CM

## 2020-01-27 DIAGNOSIS — F4312 Post-traumatic stress disorder, chronic: Secondary | ICD-10-CM

## 2020-01-27 DIAGNOSIS — F3176 Bipolar disorder, in full remission, most recent episode depressed: Secondary | ICD-10-CM

## 2020-01-27 DIAGNOSIS — Z20822 Contact with and (suspected) exposure to covid-19: Secondary | ICD-10-CM | POA: Insufficient documentation

## 2020-01-27 DIAGNOSIS — Z01812 Encounter for preprocedural laboratory examination: Secondary | ICD-10-CM | POA: Diagnosis not present

## 2020-01-27 LAB — SARS CORONAVIRUS 2 (TAT 6-24 HRS): SARS Coronavirus 2: NEGATIVE

## 2020-01-27 MED ORDER — CLONAZEPAM 0.5 MG PO TABS: 0.5000 mg | ORAL_TABLET | Freq: Every day | ORAL | 1 refills | Status: DC

## 2020-01-27 MED ORDER — TOPIRAMATE 100 MG PO TABS: 100.0000 mg | ORAL_TABLET | Freq: Every day | ORAL | 0 refills | Status: DC

## 2020-01-27 MED ORDER — DESVENLAFAXINE SUCCINATE ER 100 MG PO TB24: 100.0000 mg | ORAL_TABLET | Freq: Every day | ORAL | 0 refills | Status: DC

## 2020-01-27 MED ORDER — ARIPIPRAZOLE 20 MG PO TABS: 20.0000 mg | ORAL_TABLET | Freq: Every day | ORAL | 0 refills | Status: DC

## 2020-01-27 NOTE — Progress Notes (Addendum)
[facility] MD OP Progress Note  I connected with  Heather Jefferson on 01/27/20 by a video enabled telemedicine application and verified that I am speaking with the correct person using two identifiers.   I discussed the limitations of evaluation and management by telemedicine. The patient expressed understanding and agreed to proceed.    01/27/2020 11:22 AM Heather Jefferson  MRN:  [id number]  Chief Complaint:  "Things are a little better."  HPI: This is a 49 y/o female with hx of Bipolar disorder and chronic PTSD now seen for follow up.  Patient reported that she is doing well overall in the event of recently finding out her father was not her biological father. She states, "Things are a little better. I don't think I'm near closure but I'm maintaining." She reports that her medication have been helpful in managing psychiatric conditions.She was encouraged to focus on her personal wellness, self care, and her mental heath.  At times she reports that she wake up around three AM however notes she is able to go back to sleep. She requested that medications not be adjusted at this time.  Patient agreeable to continue her regimen the same way for now. No other acute concerns noted at this time.    Visit Diagnosis: Chronic post-traumatic stress disorder (PTSD) - Plan: ARIPiprazole (ABILIFY) 20 MG tablet, desvenlafaxine (PRISTIQ) 100 MG 24 hr tablet, topiramate (TOPAMAX) 100 MG tablet  Bipolar 1 disorder, depressed, partial remission (HCC) - Plan: ARIPiprazole (ABILIFY) 20 MG tablet, desvenlafaxine (PRISTIQ) 100 MG 24 hr tablet, topiramate (TOPAMAX) 100 MG tablet  Bipolar 1 disorder, depressed, full remission ([place]) - Plan: clonazePAM (KLONOPIN) 0.5 MG tablet   Past Psychiatric History: Chronic post-traumatic stress disorder (PTSD) - Plan: ARIPiprazole (ABILIFY) 20 MG tablet, desvenlafaxine (PRISTIQ) 100 MG 24 hr tablet, topiramate (TOPAMAX) 100 MG tablet  Bipolar 1 disorder, depressed, partial  remission (HCC) - Plan: ARIPiprazole (ABILIFY) 20 MG tablet, desvenlafaxine (PRISTIQ) 100 MG 24 hr tablet, topiramate (TOPAMAX) 100 MG tablet  Bipolar 1 disorder, depressed, full remission (HCC) - Plan: clonazePAM (KLONOPIN) 0.5 MG tablet   Past Medical History:  Past Medical History:  Diagnosis Date  . ADHD (attention deficit hyperactivity disorder)   . Allergy   . Anemia   . Anxiety   . Depression   . Diabetes mellitus, type II ([place])   . Fibromyalgia   . Generalized anxiety disorder   . Headache   . HIV infection ([place])   . Hypertension   . Major depressive disorder, recurrent episode, moderate ([place])   . Migraine with acute onset aura   . Persistent disorder of initiating or maintaining sleep   . PTSD (post-traumatic stress disorder)   . Restless leg   . Seizure disorder ([place])   . Thyroid disease   . Vitamin D deficiency     Past Surgical History:  Procedure Laterality Date  . EYE SURGERY    . TUBAL LIGATION      Family Psychiatric History: depression, anxiety in siblings  Family History:  Family History  Problem Relation Age of Onset  . Hypertension Mother   . Heart attack Mother   . CAD Mother   . Diabetes Father   . Hypertension Father   . Hypertension Sister   . Anxiety disorder Brother   . Depression Brother     Social History:  Social History   Socioeconomic History  . Marital status: Married    Spouse name: roberto  . Number of children: 2  . Years  of education: Not on file  . Highest education level: 10th grade  Occupational History  . Occupation: disabled  Tobacco Use  . Smoking status: Never Smoker  . Smokeless tobacco: Never Used  Substance and Sexual Activity  . Alcohol use: Yes    Alcohol/week: 0.0 standard drinks  . Drug use: No  . Sexual activity: Not Currently    Partners: Male    Birth control/protection: Other-see comments, I.U.D.    Comment: husband has ED  Other Topics Concern  . 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 Difficulty of Paying Living Expenses: Not hard at all  Food Insecurity: No Food Insecurity  . Worried About Charity fundraiser in the Last Year: Never true  . Ran Out of Food in the Last Year: Never true  Transportation Needs: No Transportation Needs  . Lack of Transportation (Medical): No  . Lack of Transportation (Non-Medical): No  Physical Activity: Inactive  . Days of Exercise per Week: 0 days  . Minutes of Exercise per Session: 0 min  Stress: Stress Concern Present  . Feeling of Stress : Rather much  Social Connections: Not Isolated  . Frequency of Communication with Friends and Family: More than three times a week  . Frequency of Social Gatherings with Friends and Family: Once a week  . Attends Religious Services: More than 4 times per year  . Active Member of Clubs or Organizations: Yes  . Attends Archivist Meetings: More than 4 times per year  . Marital Status: Married    Allergies:  Allergies  Allergen Reactions  . Amoxicillin-Pot Clavulanate Hives  . Losartan Cough  . Metformin And Related Diarrhea and Nausea And Vomiting    Metabolic Disorder Labs: Lab Results  Component Value Date   HGBA1C 5.2 05/04/2019   MPG 103 05/04/2019   MPG 123 10/18/2017   No results found for: PROLACTIN Lab Results  Component Value Date   CHOL 200 (H) 05/04/2019   TRIG 156 (H) 05/04/2019   HDL 48 (L) 05/04/2019   CHOLHDL 4.2 05/04/2019   VLDL 38 (H) 10/18/2016   LDLCALC 125 (H) 05/04/2019   LDLCALC 115 (H) 07/14/2018   Lab Results  Component Value Date   TSH 2.22 05/04/2019   TSH 1.340 05/07/2018    Therapeutic Level Labs: No results found for: LITHIUM No results found for: VALPROATE No components found for:  CBMZ  Current Medications: Current Outpatient Medications  Medication  Sig Dispense Refill  . amLODipine (NORVASC) 5 MG tablet Take 5 mg by mouth daily.    . ARIPiprazole (ABILIFY) 20 MG tablet Take 1 tablet (20 mg total) by mouth daily. 90 tablet 0  . clonazePAM (KLONOPIN) 0.5 MG tablet Take 1 tablet (0.5 mg total) by mouth at bedtime. 30 tablet 1  . desvenlafaxine (PRISTIQ) 100 MG 24 hr tablet Take 1 tablet (100 mg total) by mouth daily. 90 tablet 0  . hydroxychloroquine (PLAQUENIL) 200 MG tablet Take 200 mg by mouth 2 (two) times daily.    Marland Kitchen levothyroxine (SYNTHROID) 25 MCG tablet TAKE 1 TABLET DAILY AND 2 TABLETS (50 MCG)  ON SUNDAYS 102 tablet 3  . MIRENA, 52 MG, 20 MCG/24HR IUD     . Naltrexone-buPROPion HCl ER (CONTRAVE) 8-90 MG TB12 Take 2 tablets by  mouth 2 (two) times daily. 120 tablet 2  . ofloxacin (OCUFLOX) 0.3 % ophthalmic solution Place 1 drop into the right eye 4 (four) times daily.    Marland Kitchen OZEMPIC, 1 MG/DOSE, 2 MG/1.5ML SOPN INJECT 1 MG INTO THE SKIN  ONCE A WEEK (Patient not taking: Reported on 01/19/2020) 4 mL 0  . prednisoLONE acetate (PRED FORTE) 1 % ophthalmic suspension Place 1 drop into the right eye daily.     . pregabalin (LYRICA) 50 MG capsule Take 1 capsule (50 mg total) by mouth 3 (three) times daily. 270 capsule 0  . SUMAtriptan (IMITREX) 20 MG/ACT nasal spray Place 1 spray (20 mg total) into the nose every 2 (two) hours as needed for migraine. 3 Inhaler 2  . topiramate (TOPAMAX) 100 MG tablet Take 1 tablet (100 mg total) by mouth daily. 90 tablet 0  . Vitamin D, Ergocalciferol, (DRISDOL) 1.25 MG (50000 UNIT) CAPS capsule Take 1 capsule (50,000 Units total) by mouth every 7 (seven) days. 12 capsule 1   No current facility-administered medications for this visit.     Musculoskeletal: Strength & Muscle Tone: Unable to assess due to telemed visit [place]: Unable to assess due to telemed visit Patient leans: Unable to assess due to telemed visit  Psychiatric Specialty Exam: ROS  There were no vitals taken for this visit.There is  no height or weight on file to calculate BMI.  General Appearance: Well Groomed  Eye Contact:  Good  Speech:  Clear and Coherent  Volume:  Normal  Mood:  Depressed  Affect:  Sad  Thought Process:  Goal Directed, Linear and Descriptions of Associations: Intact  Orientation:  Full (Time, Place, and Person)  Thought Content: Logical   Suicidal Thoughts:  No  Homicidal Thoughts:  No  Memory:  Recent;   Good Remote;   Good  Judgement:  Fair  Insight:  Fair  Psychomotor Activity:  Negative  Concentration:  Concentration: Good and Attention Span: Good  Recall:  Good  Fund of Knowledge: Good  Language: Good  Akathisia:  No  Handed:  Right  AIMS (if indicated): not done  Assets:  Communication Skills Desire for Improvement Financial Resources/Insurance Housing Social Support Transportation  ADL's:  Intact  Cognition: WNL  Sleep:  Good   Screenings: GAD-7     Office Visit from 08/04/2019 in [facility] Office Visit from 07/08/2018 in [facility] Office Visit from 05/07/2018 in [facility] Office Visit from 02/04/2018 in [facility] Office Visit from 04/16/2016 in [facility]  Total GAD-7 Score  16  16  6  14  19     PHQ2-9     Office Visit from 01/19/2020 in [place] from 12/03/2019 in [facility] Office Visit from 11/04/2019 in [facility] Office Visit from 09/08/2019 in [facility] Office Visit from 08/04/2019 in [facility]  PHQ-2 Total Score  3  6  3  1  3   PHQ-9 Total Score  17  24  20  1  16        Assessment and Plan: Patientt undergoing stress due to ongoing life circumstances. She however reports that things are improving. Patient agreeable to continue the same medication regimen and followup in two  months to see how she is doing.  1. Chronic post-traumatic  stress disorder (PTSD)  - ARIPiprazole (ABILIFY) 20 MG tablet; Take 1 tablet (20  mg total) by mouth daily.  Dispense: 90 tablet; Refill: 0 - desvenlafaxine (PRISTIQ) 100 MG 24 hr tablet; Take 1 tablet (100 mg total) by mouth daily.  Dispense: 90 tablet; Refill: 0 - topiramate (TOPAMAX) 100 MG tablet; Take 1 tablet (100 mg total) by mouth daily.  Dispense: 90 tablet; Refill: 0  2. Bipolar 1 disorder, depressed, partial remission (HCC)  - ARIPiprazole (ABILIFY) 20 MG tablet; Take 1 tablet (20 mg total) by mouth daily.  Dispense: 90 tablet; Refill: 0 - desvenlafaxine (PRISTIQ) 100 MG 24 hr tablet; Take 1 tablet (100 mg total) by mouth daily.  Dispense: 90 tablet; Refill: 0 - topiramate (TOPAMAX) 100 MG tablet; Take 1 tablet (100 mg total) by mouth daily.  Dispense: 90 tablet; Refill: 0  3. Bipolar 1 disorder, depressed, full remission (HCC) - clonazePAM (KLONOPIN) 0.5 MG tablet; Take 1 tablet (0.5 mg total) by mouth at bedtime.  Dispense: 30 tablet; Refill: 1   Continue same medication regimen. Follow up in 2 months.   Salley Slaughter, NP 01/27/2020, 11:22 AM    I saw and managed the patient with NP B. Ronne Binning.  [place] Crane, MD 01/27/2020 11:47 AM

## 2020-01-28 ENCOUNTER — Encounter: Payer: Self-pay | Admitting: Gastroenterology

## 2020-01-29 ENCOUNTER — Encounter: Payer: Self-pay | Admitting: Gastroenterology

## 2020-01-29 ENCOUNTER — Ambulatory Visit
Admission: RE | Admit: 2020-01-29 | Discharge: 2020-01-29 | Disposition: A | Discharge status: Home / Self Care | Payer: BC Managed Care – PPO | Attending: Gastroenterology | Admitting: Gastroenterology

## 2020-01-29 ENCOUNTER — Other Ambulatory Visit: Payer: Self-pay

## 2020-01-29 ENCOUNTER — Ambulatory Visit: Payer: BC Managed Care – PPO | Admitting: Certified Registered"

## 2020-01-29 ENCOUNTER — Encounter
Admission: RE | Disposition: A | Discharge status: Home / Self Care | Payer: Self-pay | Source: Home / Self Care | Attending: Gastroenterology

## 2020-01-29 DIAGNOSIS — I10 Essential (primary) hypertension: Secondary | ICD-10-CM | POA: Diagnosis not present

## 2020-01-29 DIAGNOSIS — D123 Benign neoplasm of transverse colon: Secondary | ICD-10-CM | POA: Diagnosis not present

## 2020-01-29 DIAGNOSIS — Z79899 Other long term (current) drug therapy: Secondary | ICD-10-CM | POA: Insufficient documentation

## 2020-01-29 DIAGNOSIS — Z1211 Encounter for screening for malignant neoplasm of colon: Secondary | ICD-10-CM

## 2020-01-29 DIAGNOSIS — Z79890 Hormone replacement therapy (postmenopausal): Secondary | ICD-10-CM | POA: Diagnosis not present

## 2020-01-29 DIAGNOSIS — K635 Polyp of colon: Secondary | ICD-10-CM

## 2020-01-29 DIAGNOSIS — Z8249 Family history of ischemic heart disease and other diseases of the circulatory system: Secondary | ICD-10-CM | POA: Insufficient documentation

## 2020-01-29 DIAGNOSIS — G4730 Sleep apnea, unspecified: Secondary | ICD-10-CM | POA: Diagnosis not present

## 2020-01-29 DIAGNOSIS — E119 Type 2 diabetes mellitus without complications: Secondary | ICD-10-CM | POA: Insufficient documentation

## 2020-01-29 DIAGNOSIS — Z21 Asymptomatic human immunodeficiency virus [HIV] infection status: Secondary | ICD-10-CM | POA: Diagnosis not present

## 2020-01-29 DIAGNOSIS — F4310 Post-traumatic stress disorder, unspecified: Secondary | ICD-10-CM | POA: Insufficient documentation

## 2020-01-29 DIAGNOSIS — F419 Anxiety disorder, unspecified: Secondary | ICD-10-CM | POA: Insufficient documentation

## 2020-01-29 DIAGNOSIS — E039 Hypothyroidism, unspecified: Secondary | ICD-10-CM | POA: Insufficient documentation

## 2020-01-29 DIAGNOSIS — F329 Major depressive disorder, single episode, unspecified: Secondary | ICD-10-CM | POA: Insufficient documentation

## 2020-01-29 HISTORY — PX: COLONOSCOPY WITH PROPOFOL: SHX5780

## 2020-01-29 HISTORY — DX: Hypothyroidism, unspecified: E03.9

## 2020-01-29 SURGERY — COLONOSCOPY WITH PROPOFOL
Anesthesia: General

## 2020-01-29 MED ORDER — SODIUM CHLORIDE 0.9 % IV SOLN: INTRAVENOUS | Status: DC

## 2020-01-29 MED ORDER — LABETALOL HCL 5 MG/ML IV SOLN
20.0000 mg | Freq: Once | INTRAVENOUS | Status: AC
  Administered 2020-01-29: 20 mg via INTRAVENOUS
  Filled 2020-01-29: qty 4

## 2020-01-29 MED ORDER — PROPOFOL 500 MG/50ML IV EMUL
INTRAVENOUS | Status: DC | PRN
  Administered 2020-01-29: 100 ug/kg/min via INTRAVENOUS

## 2020-01-29 MED ORDER — PROPOFOL 10 MG/ML IV BOLUS
INTRAVENOUS | Status: DC | PRN
  Administered 2020-01-29: 50 mg via INTRAVENOUS
  Administered 2020-01-29: 30 mg via INTRAVENOUS
  Administered 2020-01-29: 20 mg via INTRAVENOUS

## 2020-01-29 MED ORDER — LIDOCAINE HCL (PF) 2 % IJ SOLN
INTRAMUSCULAR | Status: AC
  Filled 2020-01-29: qty 10

## 2020-01-29 MED ORDER — LABETALOL HCL 5 MG/ML IV SOLN
INTRAVENOUS | Status: AC
  Filled 2020-01-29: qty 4

## 2020-01-29 MED ORDER — PROPOFOL 500 MG/50ML IV EMUL
INTRAVENOUS | Status: AC
  Filled 2020-01-29: qty 50

## 2020-01-29 MED ORDER — LIDOCAINE HCL (CARDIAC) PF 100 MG/5ML IV SOSY
PREFILLED_SYRINGE | INTRAVENOUS | Status: DC | PRN
  Administered 2020-01-29: 60 mg via INTRAVENOUS

## 2020-01-29 NOTE — Anesthesia Preprocedure Evaluation (Signed)
Anesthesia Evaluation  Patient identified by MRN, date of birth, ID band Patient awake    Reviewed: Allergy & Precautions, H&P , NPO status , Patient's Chart, lab work & pertinent test results, reviewed documented beta blocker date and time   Airway Mallampati: II   Neck ROM: full    Dental  (+) Poor Dentition   Pulmonary sleep apnea ,    Pulmonary exam normal        Cardiovascular Exercise Tolerance: Good hypertension, On Medications negative cardio ROS Normal cardiovascular exam Rhythm:regular Rate:Normal     Neuro/Psych  Headaches, Seizures -,  PSYCHIATRIC DISORDERS Anxiety Depression Bipolar Disorder  Neuromuscular disease    GI/Hepatic Neg liver ROS, GERD  Medicated,  Endo/Other  diabetesHypothyroidism   Renal/GU negative Renal ROS  negative genitourinary   Musculoskeletal   Abdominal   Peds  Hematology  (+) Blood dyscrasia, anemia ,   Anesthesia Other Findings Past Medical History: No date: ADHD (attention deficit hyperactivity disorder) No date: Allergy No date: Anemia No date: Anxiety No date: Depression No date: Diabetes mellitus, type II (HCC) No date: Fibromyalgia No date: Generalized anxiety disorder No date: Headache No date: HIV infection (HCC) No date: Hypertension No date: Hypothyroidism No date: Major depressive disorder, recurrent episode, moderate (HCC) No date: Migraine with acute onset aura No date: Persistent disorder of initiating or maintaining sleep No date: PTSD (post-traumatic stress disorder) No date: Restless leg No date: Seizure disorder ([place]) No date: Thyroid disease No date: Vitamin D deficiency Past Surgical History: No date: EYE SURGERY No date: TUBAL LIGATION BMI    Body Mass Index: 49.57 kg/m     Reproductive/Obstetrics negative OB ROS                             Anesthesia Physical Anesthesia Plan  ASA: III  Anesthesia Plan:  General   Post-op Pain Management:    Induction:   PONV Risk Score and Plan:   Airway Management Planned:   Additional Equipment:   Intra-op Plan:   Post-operative Plan:   Informed Consent: I have reviewed the patients History and Physical, chart, labs and discussed the procedure including the risks, benefits and alternatives for the proposed anesthesia with the patient or authorized representative who has indicated his/her understanding and acceptance.     Dental Advisory Given  Plan Discussed with: CRNA  Anesthesia Plan Comments:         Anesthesia Quick Evaluation

## 2020-01-29 NOTE — Progress Notes (Signed)
Patient hypertensive post - op. Asymptomatic. She has blood pressure medicines at home which she has not taken today yet. Seeing as she is asymptomatic, advised it is OK for her to be discharged and resume her medicine at home. Advised to seek emergency help for any emergency symptoms such as headache and vision changes or weakness/numbness. She has a blood pressure cuff at home.

## 2020-01-29 NOTE — H&P (Signed)
Jonathon Bellows, MD [address] [place], [place], [place], [address] [address], [place], [place], [place], [address] Phone: [phone]  Fax: [phone]  Primary Care Physician:  Steele Sizer, MD   Pre-Procedure History & Physical: HPI:  RICHA CADIENTE is a 49 y.o. female is here for an colonoscopy.   Past Medical History:  Diagnosis Date  . ADHD (attention deficit hyperactivity disorder)   . Allergy   . Anemia   . Anxiety   . Depression   . Diabetes mellitus, type II ([place])   . Fibromyalgia   . Generalized anxiety disorder   . Headache   . HIV infection ([place])   . Hypertension   . Hypothyroidism   . Major depressive disorder, recurrent episode, moderate ([place])   . Migraine with acute onset aura   . Persistent disorder of initiating or maintaining sleep   . PTSD (post-traumatic stress disorder)   . Restless leg   . Seizure disorder ([place])   . Thyroid disease   . Vitamin D deficiency     Past Surgical History:  Procedure Laterality Date  . EYE SURGERY    . TUBAL LIGATION      Prior to Admission medications   Medication Sig Start Date End Date Taking? Authorizing Provider  amLODipine (NORVASC) 5 MG tablet Take 5 mg by mouth daily. 10/21/18  Yes Murlean Iba, MD  ARIPiprazole (ABILIFY) 20 MG tablet Take 1 tablet (20 mg total) by mouth daily. 01/27/20  Yes [place] Crane, MD  clonazePAM (KLONOPIN) 0.5 MG tablet Take 1 tablet (0.5 mg total) by mouth at bedtime. 01/27/20  Yes [place] Crane, MD  desvenlafaxine (PRISTIQ) 100 MG 24 hr tablet Take 1 tablet (100 mg total) by mouth daily. 01/27/20  Yes [place] Crane, MD  hydroxychloroquine (PLAQUENIL) 200 MG tablet Take 200 mg by mouth 2 (two) times daily.   Yes Behalal-Bock, Christele, MD  levothyroxine (SYNTHROID) 25 MCG tablet TAKE 1 TABLET DAILY AND 2 TABLETS (50 MCG)  ON SUNDAYS 12/25/19  Yes Sowles, Drue Stager, MD  Naltrexone-buPROPion HCl ER (CONTRAVE) 8-90 MG TB12 Take 2 tablets by mouth 2 (two) times daily.  11/04/19  Yes Sowles, Drue Stager, MD  prednisoLONE acetate (PRED FORTE) 1 % ophthalmic suspension Place 1 drop into the right eye daily.    Yes [provider]  pregabalin (LYRICA) 50 MG capsule Take 1 capsule (50 mg total) by mouth 3 (three) times daily. 11/04/19  Yes Sowles, Drue Stager, MD  Vitamin D, Ergocalciferol, (DRISDOL) 1.25 MG (50000 UNIT) CAPS capsule Take 1 capsule (50,000 Units total) by mouth every 7 (seven) days. 11/04/19  Yes Sowles, Drue Stager, MD  MIRENA, 52 MG, 20 MCG/24HR IUD  05/10/17   [provider]  ofloxacin (OCUFLOX) 0.3 % ophthalmic solution Place 1 drop into the right eye 4 (four) times daily. 11/25/19   [provider]  OZEMPIC, 1 MG/DOSE, 2 MG/1.5ML SOPN INJECT 1 MG INTO THE SKIN  ONCE A WEEK Patient not taking: Reported on 01/19/2020 12/04/19   Steele Sizer, MD  SUMAtriptan (IMITREX) 20 MG/ACT nasal spray Place 1 spray (20 mg total) into the nose every 2 (two) hours as needed for migraine. 11/04/19   Steele Sizer, MD  topiramate (TOPAMAX) 100 MG tablet Take 1 tablet (100 mg total) by mouth daily. 01/27/20   [place] Crane, MD    Allergies as of 12/03/2019 - Review Complete 12/03/2019  Allergen Reaction Noted  . Amoxicillin-pot clavulanate Hives 01/12/2016  . Losartan Cough 05/14/2015  . Metformin and  related Diarrhea and Nausea And Vomiting 05/07/2018    Family History  Problem Relation Age of Onset  . Hypertension Mother   . Heart attack Mother   . CAD Mother   . Diabetes Father   . Hypertension Father   . Hypertension Sister   . Anxiety disorder Brother   . Depression Brother     Social History   Socioeconomic History  . Marital status: Married    Spouse name: roberto  . Number of children: 2  . Years of education: Not on file  . Highest education level: 10th grade  Occupational History  . Occupation: disabled  Tobacco Use  . Smoking status: Never Smoker  . Smokeless tobacco: Never Used  Substance and Sexual Activity  .  Alcohol use: Yes    Alcohol/week: 0.0 standard drinks    Comment: none last 24 hrs  . Drug use: No  . Sexual activity: Not Currently    Partners: Male    Birth control/protection: Other-see comments, I.U.D.    Comment: husband has ED  Other Topics Concern  . 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 Difficulty of Paying Living Expenses: Not hard at all  Food Insecurity: No Food Insecurity  . Worried About Charity fundraiser in the Last Year: Never true  . Ran Out of Food in the Last Year: Never true  Transportation Needs: No Transportation Needs  . Lack of Transportation (Medical): No  . Lack of Transportation (Non-Medical): No  Physical Activity: Inactive  . Days of Exercise per Week: 0 days  . Minutes of Exercise per Session: 0 min  Stress: Stress Concern Present  . Feeling of Stress : Rather much  Social Connections: Not Isolated  . Frequency of Communication with Friends and Family: More than three times a week  . Frequency of Social Gatherings with Friends and Family: Once a week  . Attends Religious Services: More than 4 times per year  . Active Member of Clubs or Organizations: Yes  . Attends Archivist Meetings: More than 4 times per year  . Marital Status: Married  Human resources officer Violence: Not At Risk  . Fear of Current or Ex-Partner: No  . Emotionally Abused: No  . Physically Abused: No  . Sexually Abused: No    Review of Systems: See HPI, otherwise negative ROS  Physical Exam: BP 130/75   Pulse 66   Temp 97.8 F (36.6 C) (Temporal)   Resp 18   Ht 5\' 2"  (1.575 m)   Wt 122.9 kg   SpO2 100%   BMI 49.57 kg/m  General:   Alert,  pleasant and cooperative in NAD Head:  Normocephalic and atraumatic. Neck:  Supple; no masses or thyromegaly. Lungs:  Clear throughout to  auscultation, normal respiratory effort.    Heart:  +S1, +S2, Regular rate and rhythm, No edema. Abdomen:  Soft, nontender and nondistended. Normal bowel sounds, without guarding, and without rebound.   Neurologic:  Alert and  oriented x4;  grossly normal neurologically.  Impression/Plan: JAMELL KOOPMAN is here for an colonoscopy to be performed for Screening colonoscopy average risk   Risks, benefits, limitations, and alternatives regarding  colonoscopy have been reviewed with the patient.  Questions have been answered.  All parties agreeable.   Jonathon Bellows,  MD  01/29/2020, 8:57 AM

## 2020-01-29 NOTE — Op Note (Signed)
[facility] Patient Name: Heather Jefferson Procedure Date: 01/29/2020 9:50 AM MRN: [id number] Account #: [id number] Date of Birth: 1970-12-22 Admit Type: Outpatient Age: 49 Room: [facility] ENDO ROOM 2 Gender: Female Note Status: Finalized Procedure:             Colonoscopy Indications:           Screening for colorectal malignant neoplasm Providers:             Jonathon Bellows MD, MD Referring MD:          Bethena Roys. Sowles, MD (Referring MD) Medicines:             Monitored Anesthesia Care Complications:         No immediate complications. Procedure:             Pre-Anesthesia Assessment:                        - Prior to the procedure, a History and Physical was                         performed, and patient medications, allergies and                         sensitivities were reviewed. The patient's tolerance                         of previous anesthesia was reviewed.                        - The risks and benefits of the procedure and the                         sedation options and risks were discussed with the                         patient. All questions were answered and informed                         consent was obtained.                        - ASA Grade Assessment: II - A patient with mild                         systemic disease.                        After obtaining informed consent, the colonoscope was                         passed under direct vision. Throughout the procedure,                         the patient's blood pressure, pulse, and oxygen                         saturations were monitored continuously. The                         Colonoscope was introduced through the anus  and                         advanced to the the cecum, identified by the                         appendiceal orifice. The colonoscopy was performed                         with ease. The patient tolerated the procedure well.                         The  quality of the bowel preparation was excellent. Findings:      The perianal and digital rectal examinations were normal.      A 6 mm polyp was found in the proximal transverse colon. The polyp was       sessile. The polyp was removed with a cold snare. Resection and       retrieval were complete.      The exam was otherwise without abnormality on direct and retroflexion       views. Impression:            - One 6 mm polyp in the proximal transverse colon,                         removed with a cold snare. Resected and retrieved.                        - The examination was otherwise normal on direct and                         retroflexion views. Recommendation:        - Discharge patient to home (with escort).                        - Resume previous diet.                        - Continue present medications.                        - Await pathology results.                        - Repeat colonoscopy for surveillance based on                         pathology results. Procedure Code(s):     --- Professional ---                        [phone], Colonoscopy, flexible; with removal of                         tumor(s), polyp(s), or other lesion(s) by snare                         technique Diagnosis Code(s):     --- Professional ---                        Z12.11,  Encounter for screening for malignant neoplasm                         of colon                        K63.5, Polyp of colon CPT copyright 2019 American Medical Association. All rights reserved. The codes documented in this report are preliminary and upon coder review may  be revised to meet current compliance requirements. Jonathon Bellows, MD Jonathon Bellows MD, MD 01/29/2020 10:11:52 AM This report has been signed electronically. Number of Addenda: 0 Note Initiated On: 01/29/2020 9:50 AM Scope Withdrawal Time: 0 hours 10 minutes 38 seconds  Total Procedure Duration: 0 hours 14 minutes 13 seconds  Estimated Blood Loss:  Estimated blood  loss: none.      [facility]

## 2020-01-29 NOTE — Progress Notes (Signed)
Patient received in post op after procedure and had slightly elevated BP. Nurse noted on the 3rd BP take, that the diastolic was running over 100 after several adjustments and retakes. Nurse notified anesthesia team and was given the order to give a total of 20mg  Labetalol IV. After full dose given, patient still had high BP over [id number] diastolic and relayed info to anesthesia again.   Patient was asymptomatic and was given direction to take BP meds at home and check BP again. The team felt comfortable with patient's discharge.

## 2020-01-29 NOTE — Transfer of Care (Signed)
Immediate Anesthesia Transfer of Care Note  Patient: Heather Jefferson  Procedure(s) Performed: COLONOSCOPY WITH PROPOFOL (N/A )  Patient Location: PACU and Endoscopy Unit  Anesthesia Type:General  Level of Consciousness: awake and drowsy  Airway & Oxygen Therapy: Patient Spontanous Breathing  Post-op Assessment: Report given to RN and Post -op Vital signs reviewed and stable  Post vital signs: Reviewed and stable  Last Vitals:  Vitals Value Taken Time  BP 134/91 01/29/20 1014  Temp    Pulse    Resp 16 01/29/20 1014  SpO2      Last Pain:  Vitals:   01/29/20 0820  TempSrc: Temporal         Complications: No apparent anesthesia complications

## 2020-02-01 ENCOUNTER — Encounter: Payer: Self-pay | Admitting: *Deleted

## 2020-02-01 ENCOUNTER — Encounter: Payer: Self-pay | Admitting: Gastroenterology

## 2020-02-01 NOTE — Anesthesia Postprocedure Evaluation (Signed)
Anesthesia Post Note  Patient: Heather Jefferson  Procedure(s) Performed: COLONOSCOPY WITH PROPOFOL (N/A )  Patient location during evaluation: PACU Anesthesia Type: General Level of consciousness: awake and alert Pain management: pain level controlled Vital Signs Assessment: post-procedure vital signs reviewed and stable Respiratory status: spontaneous breathing, nonlabored ventilation, respiratory function stable and patient connected to nasal cannula oxygen Cardiovascular status: blood pressure returned to baseline and stable Postop Assessment: no apparent nausea or vomiting Anesthetic complications: no     Last Vitals:  Vitals:   01/29/20 1104 01/29/20 1111  BP: (!) 159/101 (!) 155/103  Pulse: 63 (!) 56  Resp: 17 15  Temp:    SpO2: 100% 100%    Last Pain:  Vitals:   01/30/20 0854  TempSrc:   PainSc: 0-No pain                 Molli Barrows

## 2020-02-04 ENCOUNTER — Other Ambulatory Visit: Payer: Self-pay

## 2020-02-04 ENCOUNTER — Encounter: Payer: Self-pay | Admitting: Family Medicine

## 2020-02-04 ENCOUNTER — Ambulatory Visit (INDEPENDENT_AMBULATORY_CARE_PROVIDER_SITE_OTHER): Payer: Medicare Other | Admitting: Family Medicine

## 2020-02-04 VITALS — BP 124/82 | HR 86 | Temp 97.8°F | Resp 16 | Ht 62.0 in | Wt 275.8 lb

## 2020-02-04 DIAGNOSIS — R202 Paresthesia of skin: Secondary | ICD-10-CM

## 2020-02-04 DIAGNOSIS — E8881 Metabolic syndrome: Secondary | ICD-10-CM

## 2020-02-04 DIAGNOSIS — G43009 Migraine without aura, not intractable, without status migrainosus: Secondary | ICD-10-CM | POA: Diagnosis not present

## 2020-02-04 DIAGNOSIS — M059 Rheumatoid arthritis with rheumatoid factor, unspecified: Secondary | ICD-10-CM | POA: Diagnosis not present

## 2020-02-04 DIAGNOSIS — E039 Hypothyroidism, unspecified: Secondary | ICD-10-CM | POA: Diagnosis not present

## 2020-02-04 DIAGNOSIS — E785 Hyperlipidemia, unspecified: Secondary | ICD-10-CM

## 2020-02-04 DIAGNOSIS — I10 Essential (primary) hypertension: Secondary | ICD-10-CM | POA: Diagnosis not present

## 2020-02-04 DIAGNOSIS — F3160 Bipolar disorder, current episode mixed, unspecified: Secondary | ICD-10-CM

## 2020-02-04 DIAGNOSIS — F331 Major depressive disorder, recurrent, moderate: Secondary | ICD-10-CM | POA: Diagnosis not present

## 2020-02-04 DIAGNOSIS — M797 Fibromyalgia: Secondary | ICD-10-CM | POA: Diagnosis not present

## 2020-02-04 DIAGNOSIS — E559 Vitamin D deficiency, unspecified: Secondary | ICD-10-CM

## 2020-02-04 MED ORDER — VITAMIN D (ERGOCALCIFEROL) 1.25 MG (50000 UNIT) PO CAPS: 50000.0000 [IU] | ORAL_CAPSULE | ORAL | 1 refills | Status: DC

## 2020-02-04 MED ORDER — PREGABALIN 50 MG PO CAPS: 50.0000 mg | ORAL_CAPSULE | Freq: Three times a day (TID) | ORAL | 1 refills | Status: DC
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
## 2020-02-24 DIAGNOSIS — G5623 Lesion of ulnar nerve, bilateral upper limbs: Secondary | ICD-10-CM | POA: Diagnosis not present

## 2020-03-24 ENCOUNTER — Encounter (HOSPITAL_COMMUNITY): Payer: Self-pay | Admitting: Psychiatry

## 2020-03-24 ENCOUNTER — Telehealth (INDEPENDENT_AMBULATORY_CARE_PROVIDER_SITE_OTHER): Payer: BC Managed Care – PPO | Admitting: Psychiatry

## 2020-03-24 ENCOUNTER — Telehealth: Payer: Medicare Other | Admitting: Psychiatry

## 2020-03-24 ENCOUNTER — Other Ambulatory Visit: Payer: Self-pay

## 2020-03-24 DIAGNOSIS — F3130 Bipolar disorder, current episode depressed, mild or moderate severity, unspecified: Secondary | ICD-10-CM

## 2020-03-24 DIAGNOSIS — F3175 Bipolar disorder, in partial remission, most recent episode depressed: Secondary | ICD-10-CM | POA: Diagnosis not present

## 2020-03-24 DIAGNOSIS — F4312 Post-traumatic stress disorder, chronic: Secondary | ICD-10-CM | POA: Diagnosis not present

## 2020-03-24 MED ORDER — TEMAZEPAM 15 MG PO CAPS: 15.0000 mg | ORAL_CAPSULE | Freq: Every evening | ORAL | 1 refills | Status: DC | PRN

## 2020-03-24 MED ORDER — TOPIRAMATE 100 MG PO TABS: 100.0000 mg | ORAL_TABLET | Freq: Every day | ORAL | 0 refills | Status: DC

## 2020-03-24 MED ORDER — DESVENLAFAXINE SUCCINATE ER 100 MG PO TB24: 100.0000 mg | ORAL_TABLET | Freq: Every day | ORAL | 0 refills | Status: DC

## 2020-03-24 MED ORDER — ARIPIPRAZOLE 20 MG PO TABS: 20.0000 mg | ORAL_TABLET | Freq: Every day | ORAL | 0 refills | Status: DC

## 2020-03-24 NOTE — Progress Notes (Signed)
[place] MD OP Progress Note Virtual Visit via Video Note  I connected with Heather Jefferson on 03/24/20 at 11:00 AM EDT by a video enabled telemedicine application and verified that I am speaking with the correct person using two identifiers.  Location: Patient: Home Provider: Clinic   I discussed the limitations of evaluation and management by telemedicine and the availability of in person appointments. The patient expressed understanding and agreed to proceed.  I provided 16 minutes of non-face-to-face time during this encounter.    03/24/2020 11:11 AM STARLET GALLENTINE  MRN:  [id number]  Chief Complaint:  " Things have been stressful."  HPI: Patient reported that things have stayed stressful and she has continued to feel depressed.  She reported that she notices the circumstances that she is dealing with.  She stated that she has made an appointment to see a therapist for next week to discuss coping skills and stress management.  She also reported poor sleep at night.  She was agreeable to trial of temazepam to help with sleep.  She has been using clonazepam 0.5 mg to fall asleep but it does not help much with staying asleep.   Visit Diagnosis: Bipolar I disorder, most recent episode depressed ([place])  Chronic post-traumatic stress disorder (PTSD)   Past Psychiatric History: Bipolar I disorder, most recent episode depressed ([place])  Chronic post-traumatic stress disorder (PTSD)   Past Medical History:  Past Medical History:  Diagnosis Date  . ADHD (attention deficit hyperactivity disorder)   . Allergy   . Anemia   . Anxiety   . Depression   . Diabetes mellitus, type II ([place])   . Fibromyalgia   . Generalized anxiety disorder   . Headache   . HIV infection ([place])   . Hypertension   . Hypothyroidism   . Major depressive disorder, recurrent episode, moderate ([place])   . Migraine with acute onset aura   . Persistent disorder of initiating or maintaining sleep   . PTSD  (post-traumatic stress disorder)   . Restless leg   . Seizure disorder ([place])   . Thyroid disease   . Vitamin D deficiency     Past Surgical History:  Procedure Laterality Date  . COLONOSCOPY WITH PROPOFOL N/A 01/29/2020   Procedure: COLONOSCOPY WITH PROPOFOL;  Surgeon: Jonathon Bellows, MD;  Location: [facility] ENDOSCOPY;  Service: Gastroenterology;  Laterality: N/A;  . EYE SURGERY    . TUBAL LIGATION      Family Psychiatric History: depression, anxiety in siblings  Family History:  Family History  Problem Relation Age of Onset  . Hypertension Mother   . Heart attack Mother   . CAD Mother   . Diabetes Father   . Hypertension Father   . Hypertension Sister   . Anxiety disorder Brother   . Depression Brother     Social History:  Social History   Socioeconomic History  . Marital status: Married    Spouse name: roberto  . Number of children: 2  . Years of education: Not on file  . Highest education level: 10th grade  Occupational History  . Occupation: disabled  Tobacco Use  . Smoking status: Never Smoker  . Smokeless tobacco: Never Used  Vaping Use  . Vaping Use: Never used  Substance and Sexual Activity  . Alcohol use: Yes    Alcohol/week: 0.0 standard drinks    Comment: none last 24 hrs  . Drug use: No  . Sexual activity: Not Currently    Partners: Male  Birth control/protection: Other-see comments, I.U.D.    Comment: husband has ED  Other Topics Concern  . 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 Difficulty of Paying Living Expenses: Not hard at all  Food Insecurity: No Food Insecurity  . Worried About Charity fundraiser in the Last Year: Never true  . Ran Out of Food in the Last Year: Never true  Transportation Needs: No Transportation Needs  . Lack of Transportation (Medical): No   . Lack of Transportation (Non-Medical): No  Physical Activity: Inactive  . Days of Exercise per Week: 0 days  . Minutes of Exercise per Session: 0 min  Stress: Stress Concern Present  . Feeling of Stress : Rather much  Social Connections: Socially Integrated  . Frequency of Communication with Friends and Family: More than three times a week  . Frequency of Social Gatherings with Friends and Family: Once a week  . Attends Religious Services: More than 4 times per year  . Active Member of Clubs or Organizations: Yes  . Attends Archivist Meetings: More than 4 times per year  . Marital Status: Married    Allergies:  Allergies  Allergen Reactions  . Amoxicillin-Pot Clavulanate Hives  . Losartan Cough  . Metformin And Related Diarrhea and Nausea And Vomiting    Metabolic Disorder Labs: Lab Results  Component Value Date   HGBA1C 5.2 05/04/2019   MPG 103 05/04/2019   MPG 123 10/18/2017   No results found for: PROLACTIN Lab Results  Component Value Date   CHOL 200 (H) 05/04/2019   TRIG 156 (H) 05/04/2019   HDL 48 (L) 05/04/2019   CHOLHDL 4.2 05/04/2019   VLDL 38 (H) 10/18/2016   LDLCALC 125 (H) 05/04/2019   LDLCALC 115 (H) 07/14/2018   Lab Results  Component Value Date   TSH 2.22 05/04/2019   TSH 1.340 05/07/2018    Therapeutic Level Labs: No results found for: LITHIUM No results found for: VALPROATE No components found for:  CBMZ  Current Medications: Current Outpatient Medications  Medication Sig Dispense Refill  . amLODipine (NORVASC) 5 MG tablet Take 5 mg by mouth daily.    . ARIPiprazole (ABILIFY) 20 MG tablet Take 1 tablet (20 mg total) by mouth daily. 90 tablet 0  . clonazePAM (KLONOPIN) 0.5 MG tablet Take 1 tablet (0.5 mg total) by mouth at bedtime. 30 tablet 1  . desvenlafaxine (PRISTIQ) 100 MG 24 hr tablet Take 1 tablet (100 mg total) by mouth daily. 90 tablet 0  . hydroxychloroquine (PLAQUENIL) 200 MG tablet Take 200 mg by mouth 2 (two) times  daily.    Marland Kitchen levothyroxine (SYNTHROID) 25 MCG tablet TAKE 1 TABLET DAILY AND 2 TABLETS (50 MCG)  ON SUNDAYS 102 tablet 3  . MIRENA, 52 MG, 20 MCG/24HR IUD     . ofloxacin (OCUFLOX) 0.3 % ophthalmic solution Place 1 drop into the right eye 4 (four) times daily.    . prednisoLONE acetate (PRED FORTE) 1 % ophthalmic suspension Place 1 drop into the right eye daily.     . pregabalin (LYRICA) 50 MG capsule Take 1 capsule (50 mg total) by mouth 3 (three) times daily. 270 capsule 1  . SUMAtriptan (IMITREX) 20 MG/ACT nasal spray Place 1 spray (20 mg total) into the nose every 2 (  two) hours as needed for migraine. 3 Inhaler 2  . topiramate (TOPAMAX) 100 MG tablet Take 1 tablet (100 mg total) by mouth daily. 90 tablet 0  . Vitamin D, Ergocalciferol, (DRISDOL) 1.25 MG (50000 UNIT) CAPS capsule Take 1 capsule (50,000 Units total) by mouth every 7 (seven) days. 12 capsule 1   No current facility-administered medications for this visit.     Musculoskeletal: Strength & Muscle Tone: Unable to assess due to telemed visit [place]: Unable to assess due to telemed visit Patient leans: Unable to assess due to telemed visit  Psychiatric Specialty Exam: ROS  There were no vitals taken for this visit.There is no height or weight on file to calculate BMI.  General Appearance: Well Groomed  Eye Contact:  Good  Speech:  Clear and Coherent  Volume:  Normal  Mood:  Depressed  Affect:  Sad  Thought Process:  Goal Directed, Linear and Descriptions of Associations: Intact  Orientation:  Full (Time, Place, and Person)  Thought Content: Logical   Suicidal Thoughts:  No  Homicidal Thoughts:  No  Memory:  Recent;   Good Remote;   Good  Judgement:  Fair  Insight:  Fair  Psychomotor Activity:  Negative  Concentration:  Concentration: Good and Attention Span: Good  Recall:  Good  Fund of Knowledge: Good  Language: Good  Akathisia:  No  Handed:  Right  AIMS (if indicated): not done  Assets:  Communication  Skills Desire for Improvement Financial Resources/Insurance Housing Social Support Transportation  ADL's:  Intact  Cognition: WNL  Sleep:  Good   Screenings: GAD-7     Office Visit from 08/04/2019 in [facility] Office Visit from 07/08/2018 in [facility] Office Visit from 05/07/2018 in [facility] Office Visit from 02/04/2018 in [facility] Office Visit from 04/16/2016 in [facility]  Total GAD-7 Score 16 16 6 14 19     PHQ2-9     Office Visit from 02/04/2020 in [facility] Office Visit from 01/19/2020 in [place] from 12/03/2019 in [facility] Office Visit from 11/04/2019 in [facility] Office Visit from 09/08/2019 in [facility]  PHQ-2 Total Score 6 3 6 3 1   PHQ-9 Total Score 19 17 24 20 1        Assessment and Plan: Patient reported that she still feeling overwhelmed due to her personal psychosocial stressors.  She has set up an appointment to see therapist next week.  She was agreeable to trying temazepam for sleep. Potential side effects of medication and risks vs benefits of treatment vs non-treatment were explained and discussed. All questions were answered.  1. Bipolar I disorder, most recent episode depressed ([facility])  - ARIPiprazole (ABILIFY) 20 MG tablet; Take 1 tablet (20 mg total) by mouth daily.  Dispense: 90 tablet; Refill: 0 - topiramate (TOPAMAX) 100 MG tablet; Take 1 tablet (100 mg total) by mouth daily.  Dispense: 90 tablet; Refill: 0 - desvenlafaxine (PRISTIQ) 100 MG 24 hr tablet; Take 1 tablet (100 mg total) by mouth daily.  Dispense: 90 tablet; Refill: 0 -Start temazepam (RESTORIL) 15 MG capsule; Take 1 capsule (15 mg total) by mouth at bedtime as needed for sleep.  Dispense: 30 capsule; Refill: 1  2. Chronic post-traumatic stress disorder (PTSD)  -  ARIPiprazole (ABILIFY) 20 MG tablet; Take 1 tablet (20 mg total) by mouth daily.  Dispense: 90 tablet; Refill: 0 -  topiramate (TOPAMAX) 100 MG tablet; Take 1 tablet (100 mg total) by mouth daily.  Dispense: 90 tablet; Refill: 0 - desvenlafaxine (PRISTIQ) 100 MG 24 hr tablet; Take 1 tablet (100 mg total) by mouth daily.  Dispense: 90 tablet; Refill: 0    Starting individual therapy next week. Follow up in 2 months.   Nevada Crane, MD 03/24/2020, 11:11 AM

## 2020-03-30 ENCOUNTER — Other Ambulatory Visit: Payer: Self-pay

## 2020-03-30 ENCOUNTER — Ambulatory Visit (INDEPENDENT_AMBULATORY_CARE_PROVIDER_SITE_OTHER): Payer: Medicare Other | Admitting: Licensed Clinical Social Worker

## 2020-03-30 DIAGNOSIS — F3130 Bipolar disorder, current episode depressed, mild or moderate severity, unspecified: Secondary | ICD-10-CM

## 2020-03-30 NOTE — Patient Instructions (Signed)
Caring for Your Mental Health Mental health is emotional, psychological, and social well-being. Mental health is just as important as physical health. In fact, mental and physical health are connected, and you need both to be healthy. Some signs of good mental health (well-being) include:  Being able to attend to tasks at home, school, or work.  Being able to manage stress and emotions.  Practicing self-care, which may include: ? A regular exercise pattern. ? A reasonably healthy diet. ? Supportive and trusting relationships. ? The ability to relax and calm yourself (self-calm).  Having pleasurable hobbies and activities to do.  Believing that you have meaning and purpose in your life.  Recovering and adjusting after facing challenges (resilience). You can take steps to build or strengthen these mentally healthy behaviors. There are resources and support to help you with this. Why is caring for mental health important? Caring for your mental health is a big part of staying healthy. Everyone has times when feelings, thoughts, or situations feel overwhelming. Mental health means having the skills to manage what feels overwhelming. If this sense of being overwhelmed persists, however, you might need some help. If you have some of the following signs, you may need to take better care of your mental health or seek help from a health care provider or mental health professional:  Problems with energy or focus.  Changes in eating habits.  Problems sleeping, such as sleeping too much or not enough.  Emotional distress, such as anger, sadness, depression, or anxiety.  Major changes in your relationships.  Losing interest in life or activities that you used to enjoy. If you have any of these symptoms on most days for 2 weeks or longer:  Talk with a close friend or family member about how you are feeling.  Contact your health care provider to discuss your symptoms.  Consider working with a  Education officer, community. Your health care provider, family, or friends may be able to recommend a therapist. What can I do to promote emotional and mental health? Managing emotions  Learn to identify emotions and deal with them. Recognizing your emotions is the first step in learning to deal with them.  Practice ways to appropriately express feelings. Remember that you can control your feelings. They do not control you.  Practice stress management techniques, such as: ? Relaxation techniques, like breathing or muscle relaxation exercises. ? Exercise. Regular activity can lower your stress level. ? Changing what you can change and accepting what you cannot change.  Build up your resilience so that you can recover and adjust after big problems or challenges. Practice resilient behaviors and attitudes: ? Set and focus on long-term goals. ? Develop and maintain healthy, supportive relationships. ? Learn to accept change and make the best of the situation. ? Take care of yourself physically by eating a healthy diet, getting plenty of sleep, and exercising regularly. ? Develop self-awareness. Ask others to give feedback about how they see you. ? Practice mindfulness meditation to help you stay calm when dealing with daily challenges. ? Learn to respond to situations in healthy ways, rather than reacting with your emotions. ? Keep a positive attitude, and believe in yourself. Your view of yourself affects your mental health. ? Develop your listening and empathy skills. These will help you deal with difficult situations and communications.  Remember that emotions can be used as a good source of communication and are a great source of energy. Try to laugh and find humor in life.  Sleeping  Get the right amount and quality of sleep. Sleep has a big impact on physical and mental health. To improve your sleep: ? Go to bed and wake up around the same time every day. ? Limit screen time before  bedtime. This includes the use of your cell phone, TV, computer, and tablet. ? Keep your bedroom dark and cool. Activity   Exercise or do some physical activity regularly. This helps: ? Keep your body strong, especially during times of stress. ? Get rid of chemicals in your body (hormones) that build up when you are stressed. ? Build up your resilience. Eating and drinking   Eat a healthy diet that includes whole grains, vegetables, fresh fruits, and lean proteins. If you have questions about what foods are best for you, ask your health care provider.  Try not to turn to sweet, salty, or otherwise unhealthy foods when you are tired or unhappy. This can lead to unwanted weight gain and is not a healthy way to cope with emotions. Where to find more information You can find more information about how to care for your mental health from:  National Alliance on Mental Illness (NAMI): www.nami.org  National Institute of Mental Health: www.nimh.nih.gov  Centers for Disease Control and Prevention: www.cdc.gov/hrqol/wellbeing.htm Contact a health care provider if:  You lose interest in being with others or you do not want to leave the house.  You have a hard time completing your normal activities or you have less energy than normal.  You cannot stay focused or you have problems with memory.  You feel that your senses are heightened, and this makes you upset or concerned.  You feel nervous or have rapid mood changes.  You are sleeping or eating more or less than normal.  You question reality or you show odd behavior that disturbs you or others. Get help right away if:  You have thoughts about hurting yourself or others. If you ever feel like you may hurt yourself or others, or have thoughts about taking your own life, get help right away. You can go to your nearest emergency department or call:  Your local emergency services (911 in the [place]  A suicide crisis helpline, such as the  National Suicide Prevention Lifeline at 1-800-273-8255. This is open 24 hours a day. Summary  Mental health is not just the absence of mental illness. It involves understanding your emotions and behaviors, and taking steps to cope with them in a healthy way.  If you have symptoms of mental or emotional distress, get help from family, friends, a health care provider, or a mental health professional.  Practice good mental health behaviors such as stress management skills, self-calming skills, exercise, and healthy sleeping and eating. This information is not intended to replace advice given to you by your health care provider. Make sure you discuss any questions you have with your health care provider. Document Revised: 08/23/2017 Document Reviewed: 01/22/2017 Elsevier Patient Education  2020 Elsevier Inc.  
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
## 2020-04-27 ENCOUNTER — Ambulatory Visit (INDEPENDENT_AMBULATORY_CARE_PROVIDER_SITE_OTHER): Payer: Medicare Other | Admitting: Licensed Clinical Social Worker

## 2020-04-27 ENCOUNTER — Other Ambulatory Visit: Payer: Self-pay

## 2020-04-27 DIAGNOSIS — F4312 Post-traumatic stress disorder, chronic: Secondary | ICD-10-CM

## 2020-04-27 DIAGNOSIS — F3130 Bipolar disorder, current episode depressed, mild or moderate severity, unspecified: Secondary | ICD-10-CM | POA: Diagnosis not present
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
## 2020-05-10 ENCOUNTER — Encounter: Payer: Self-pay | Admitting: Family Medicine

## 2020-05-10 ENCOUNTER — Other Ambulatory Visit: Payer: Self-pay

## 2020-05-10 ENCOUNTER — Ambulatory Visit (INDEPENDENT_AMBULATORY_CARE_PROVIDER_SITE_OTHER): Payer: Medicare Other | Admitting: Family Medicine

## 2020-05-10 DIAGNOSIS — N2581 Secondary hyperparathyroidism of renal origin: Secondary | ICD-10-CM

## 2020-05-10 DIAGNOSIS — G43009 Migraine without aura, not intractable, without status migrainosus: Secondary | ICD-10-CM

## 2020-05-10 DIAGNOSIS — M797 Fibromyalgia: Secondary | ICD-10-CM

## 2020-05-10 DIAGNOSIS — R739 Hyperglycemia, unspecified: Secondary | ICD-10-CM | POA: Diagnosis not present

## 2020-05-10 DIAGNOSIS — I10 Essential (primary) hypertension: Secondary | ICD-10-CM

## 2020-05-10 DIAGNOSIS — E8881 Metabolic syndrome: Secondary | ICD-10-CM | POA: Diagnosis not present

## 2020-05-10 DIAGNOSIS — E785 Hyperlipidemia, unspecified: Secondary | ICD-10-CM

## 2020-05-10 DIAGNOSIS — F3160 Bipolar disorder, current episode mixed, unspecified: Secondary | ICD-10-CM

## 2020-05-10 DIAGNOSIS — E039 Hypothyroidism, unspecified: Secondary | ICD-10-CM

## 2020-05-10 DIAGNOSIS — M059 Rheumatoid arthritis with rheumatoid factor, unspecified: Secondary | ICD-10-CM

## 2020-05-10 DIAGNOSIS — E559 Vitamin D deficiency, unspecified: Secondary | ICD-10-CM | POA: Diagnosis not present

## 2020-05-10 MED ORDER — VITAMIN D (ERGOCALCIFEROL) 1.25 MG (50000 UNIT) PO CAPS: 50000.0000 [IU] | ORAL_CAPSULE | ORAL | 1 refills | Status: DC

## 2020-05-10 MED ORDER — SUMATRIPTAN 20 MG/ACT NA SOLN: 20.0000 mg | NASAL | 2 refills | Status: DC | PRN
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
## 2020-05-11 LAB — COMPLETE METABOLIC PANEL WITH GFR
AG Ratio: 1.5 (calc) (ref 1.0–2.5)
ALT: 14 U/L (ref 6–29)
AST: 13 U/L (ref 10–35)
Albumin: 4.3 g/dL (ref 3.6–5.1)
Alkaline phosphatase (APISO): 163 U/L — ABNORMAL HIGH (ref 37–153)
BUN: 8 mg/dL (ref 7–25)
CO2: 27 mmol/L (ref 20–32)
Calcium: 9.3 mg/dL (ref 8.6–10.4)
Chloride: 105 mmol/L (ref 98–110)
Creat: 0.89 mg/dL (ref 0.50–1.05)
GFR, Est African American: 88 mL/min/{1.73_m2} (ref >=60)
GFR, Est Non African American: 76 mL/min/{1.73_m2} (ref >=60)
Globulin: 2.9 g/dL (calc) (ref 1.9–3.7)
Glucose, Bld: 90 mg/dL (ref 65–99)
Potassium: 4 mmol/L (ref 3.5–5.3)
Sodium: 141 mmol/L (ref 135–146)
Total Bilirubin: 0.3 mg/dL (ref 0.2–1.2)
Total Protein: 7.2 g/dL (ref 6.1–8.1)

## 2020-05-11 LAB — HEMOGLOBIN A1C
Hgb A1c MFr Bld: 5.8 % of total Hgb — ABNORMAL HIGH (ref <5.7)
Mean Plasma Glucose: 120 (calc)
eAG (mmol/L): 6.6 (calc)

## 2020-05-11 LAB — CBC WITH DIFFERENTIAL/PLATELET
Absolute Monocytes: 409 cells/uL (ref 200–950)
Basophils Absolute: 43 cells/uL (ref 0–200)
Basophils Relative: 0.7 %
Eosinophils Absolute: 79 cells/uL (ref 15–500)
Eosinophils Relative: 1.3 %
HCT: 40.6 % (ref 35.0–45.0)
Hemoglobin: 13.8 g/dL (ref 11.7–15.5)
Lymphs Abs: 1830 cells/uL (ref 850–3900)
MCH: 29.6 pg (ref 27.0–33.0)
MCHC: 34 g/dL (ref 32.0–36.0)
MCV: 86.9 fL (ref 80.0–100.0)
MPV: 11.3 fL (ref 7.5–12.5)
Monocytes Relative: 6.7 %
Neutro Abs: 3739 cells/uL (ref 1500–7800)
Neutrophils Relative %: 61.3 %
Platelets: 266 10*3/uL (ref 140–400)
RBC: 4.67 10*6/uL (ref 3.80–5.10)
RDW: 13.3 % (ref 11.0–15.0)
Total Lymphocyte: 30 %
WBC: 6.1 10*3/uL (ref 3.8–10.8)

## 2020-05-11 LAB — LIPID PANEL
Cholesterol: 212 mg/dL — ABNORMAL HIGH (ref <200)
HDL: 53 mg/dL (ref >=50)
LDL Cholesterol (Calc): 124 mg/dL (calc) — ABNORMAL HIGH
Non-HDL Cholesterol (Calc): 159 mg/dL (calc) — ABNORMAL HIGH (ref <130)
Total CHOL/HDL Ratio: 4 (calc) (ref <5.0)
Triglycerides: 231 mg/dL — ABNORMAL HIGH (ref <150)

## 2020-05-11 LAB — VITAMIN D 25 HYDROXY (VIT D DEFICIENCY, FRACTURES): Vit D, 25-Hydroxy: 20 ng/mL — ABNORMAL LOW (ref 30–100)

## 2020-05-11 LAB — TSH: TSH: 3.35 mIU/L

## 2020-05-11 LAB — PARATHYROID HORMONE, INTACT (NO CA): PTH: 125 pg/mL — ABNORMAL HIGH (ref 14–64)

## 2020-05-12 DIAGNOSIS — R200 Anesthesia of skin: Secondary | ICD-10-CM | POA: Diagnosis not present

## 2020-05-16 ENCOUNTER — Telehealth: Payer: Self-pay

## 2020-05-16 ENCOUNTER — Other Ambulatory Visit: Payer: Self-pay | Admitting: Family Medicine

## 2020-05-16 DIAGNOSIS — E213 Hyperparathyroidism, unspecified: Secondary | ICD-10-CM

## 2020-05-16 NOTE — Telephone Encounter (Signed)
I called this patient to inform her that she has been scheduled to have her ultrasound this Friday (May 20, 2020) @ 4pm at the [facility].

## 2020-05-18 ENCOUNTER — Ambulatory Visit: Payer: Medicare Other | Admitting: Licensed Clinical Social Worker

## 2020-05-19 DIAGNOSIS — M059 Rheumatoid arthritis with rheumatoid factor, unspecified: Secondary | ICD-10-CM | POA: Diagnosis not present

## 2020-05-19 DIAGNOSIS — M797 Fibromyalgia: Secondary | ICD-10-CM | POA: Diagnosis not present

## 2020-05-19 DIAGNOSIS — M25561 Pain in right knee: Secondary | ICD-10-CM | POA: Diagnosis not present

## 2020-05-19 DIAGNOSIS — Z79899 Other long term (current) drug therapy: Secondary | ICD-10-CM | POA: Diagnosis not present

## 2020-05-20 ENCOUNTER — Other Ambulatory Visit: Payer: Self-pay

## 2020-05-20 ENCOUNTER — Ambulatory Visit
Admission: RE | Admit: 2020-05-20 | Discharge: 2020-05-20 | Disposition: A | Discharge status: Home / Self Care | Payer: BC Managed Care – PPO | Source: Ambulatory Visit | Attending: Family Medicine | Admitting: Family Medicine

## 2020-05-20 DIAGNOSIS — E034 Atrophy of thyroid (acquired): Secondary | ICD-10-CM | POA: Diagnosis not present

## 2020-05-20 DIAGNOSIS — E213 Hyperparathyroidism, unspecified: Secondary | ICD-10-CM | POA: Insufficient documentation

## 2020-05-22 ENCOUNTER — Other Ambulatory Visit: Payer: Self-pay | Admitting: Family Medicine

## 2020-05-22 DIAGNOSIS — E213 Hyperparathyroidism, unspecified: Secondary | ICD-10-CM

## 2020-05-23 ENCOUNTER — Telehealth (INDEPENDENT_AMBULATORY_CARE_PROVIDER_SITE_OTHER): Payer: BC Managed Care – PPO | Admitting: Psychiatry

## 2020-05-23 ENCOUNTER — Other Ambulatory Visit: Payer: Self-pay

## 2020-05-23 ENCOUNTER — Encounter (HOSPITAL_COMMUNITY): Payer: Self-pay | Admitting: Psychiatry

## 2020-05-23 DIAGNOSIS — F3130 Bipolar disorder, current episode depressed, mild or moderate severity, unspecified: Secondary | ICD-10-CM | POA: Diagnosis not present

## 2020-05-23 DIAGNOSIS — F4312 Post-traumatic stress disorder, chronic: Secondary | ICD-10-CM

## 2020-05-23 MED ORDER — ARIPIPRAZOLE 20 MG PO TABS: 20.0000 mg | ORAL_TABLET | Freq: Every day | ORAL | 0 refills | Status: DC

## 2020-05-23 MED ORDER — DESVENLAFAXINE SUCCINATE ER 100 MG PO TB24: 100.0000 mg | ORAL_TABLET | Freq: Every day | ORAL | 0 refills | Status: DC

## 2020-05-23 MED ORDER — TEMAZEPAM 30 MG PO CAPS: 30.0000 mg | ORAL_CAPSULE | Freq: Every evening | ORAL | 1 refills | Status: DC | PRN

## 2020-05-23 MED ORDER — TOPIRAMATE 100 MG PO TABS: 100.0000 mg | ORAL_TABLET | Freq: Every day | ORAL | 0 refills | Status: DC
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
## 2020-05-24 ENCOUNTER — Other Ambulatory Visit: Payer: Self-pay

## 2020-05-24 ENCOUNTER — Ambulatory Visit (INDEPENDENT_AMBULATORY_CARE_PROVIDER_SITE_OTHER): Payer: BC Managed Care – PPO | Admitting: Licensed Clinical Social Worker

## 2020-05-24 DIAGNOSIS — F4312 Post-traumatic stress disorder, chronic: Secondary | ICD-10-CM

## 2020-05-24 NOTE — Progress Notes (Signed)
Virtual Visit via Video Note  I connected with Heather Jefferson on 05/24/20 at  8:00 AM EDT by a video enabled telemedicine application and verified that I am speaking with the correct person using two identifiers.  Location: Patient: home Provider: [facility]   I discussed the limitations of evaluation and management by telemedicine and the availability of in person appointments. The patient expressed understanding and agreed to proceed.   The patient was advised to call back or seek an in-person evaluation if the symptoms worsen or if the condition fails to improve as anticipated.  I provided 45 minutes of non-face-to-face time during this encounter.   Kaetlyn Noa R Omer Monter, LCSW    THERAPIST PROGRESS NOTE  Session Time: 8:00-8:45 am  Participation Level: Active  Behavioral Response: Neat and Well GroomedAlertDepressed  Type of Therapy: Individual Therapy  Treatment Goals addressed: Coping  Interventions: Reframing and Other: trauma-focused  Summary: Heather Jefferson is a 49 y.o. female who presents with continuing symptoms related to her diagnosis. Pt reports that her mood is fluctuating, sleep quality is not great, and that she is feeling underlying sense of dread, anxiety, and fear.   Allowed pt to explore and express thoughts and feelings surrounding the relationship with her mother. Allowed pt to explore traumas from the past involving her mother, and the lifelong psychological impact of the trauma.  Pt would like to have a conversation with her mother to clear up some feelings about the past and to understand her mother's perspective about why decisions were made.   Supported pt unconditionally and encouraged her to focus on self-care.   Suicidal/Homicidal: No  Therapist Response: Heather Jefferson reports continuing symptoms related to past traumas. Due to escalation in symptoms, progress is fluctuating/intermittent at time of session.   Plan: Return again in 2 weeks. Ongoing  treatment plan is to improve mood management, manage stress/anxiety and trauma-focused interventions.   Diagnosis: Axis I: Post Traumatic Stress Disorder    Axis II: No diagnosis    Rachel Bo Salome Cozby, LCSW 05/24/2020

## 2020-06-07 ENCOUNTER — Ambulatory Visit (INDEPENDENT_AMBULATORY_CARE_PROVIDER_SITE_OTHER): Payer: BC Managed Care – PPO | Admitting: Licensed Clinical Social Worker

## 2020-06-07 ENCOUNTER — Other Ambulatory Visit: Payer: Self-pay

## 2020-06-07 DIAGNOSIS — F4312 Post-traumatic stress disorder, chronic: Secondary | ICD-10-CM | POA: Diagnosis not present

## 2020-06-07 DIAGNOSIS — F3130 Bipolar disorder, current episode depressed, mild or moderate severity, unspecified: Secondary | ICD-10-CM

## 2020-06-07 NOTE — Progress Notes (Signed)
Virtual Visit via Video Note  I connected with Heather Jefferson on 06/07/20 at  9:00 AM EDT by a video enabled telemedicine application and verified that I am speaking with the correct person using two identifiers.  Location: Patient: home Provider: [facility]   I discussed the limitations of evaluation and management by telemedicine and the availability of in person appointments. The patient expressed understanding and agreed to proceed.   The patient was advised to call back or seek an in-person evaluation if the symptoms worsen or if the condition fails to improve as anticipated.  I provided 45 minutes of non-face-to-face time during this encounter.   Dave Mannes R Andrea Colglazier, LCSW    THERAPIST PROGRESS NOTE  Session Time: 9:00-9:45 am  Participation Level: Active  Behavioral Response: NeatAlertAnxious  Type of Therapy: Individual Therapy  Treatment Goals addressed: Anxiety and Coping  Interventions: CBT, Supportive and Family Systems  Summary: Heather Jefferson is a 49 y.o. female who presents with improving symptoms related to her diagnoses (PTSD, bipolar).  Pt reports that mood is stable, sleep quality and quantity is improved, and pt feels she is managing stress and anxiety better.   Allowed pt to explore and express thoughts and feelings about a recent conversation that pt had with her mother about why her mother had her give her baby up for adoption.  This conversation is a breakthrough in pts trauma processing because pt has avoided the conversation for years out of fear of mother's response. Pts mother responded well and pt reports it was a great conversation to help facilitate closure from this incident.   Allowed pt to express her feelings triggered by the conversation: sadness, guilt, anger. Pt reports that mother was emotional but stated that she often hides her emotions from others. Discussed emotions and defense mechanisms.   Praised pts ability to face her biggest  fear and have this conversation with her mother. Discussed importance of continuing trauma processing/healing. Encouraged pt to focus on self care and life balance now that she is in recovery part of most recent trauma processing.    Closed session with safe place/positive cognitions.   Suicidal/Homicidal: No  SI, HI, or AVH reported at time of session.  Therapist Response: Zanita continues to develop in the areas of family and relational functioning. [name] continues to gain mastery over traumatic stimuli and fears. This is reflective of continued progress.   Plan: Return again in 3  Weeks.Ongoing treatment plan includes maintaining progress gains, trauma processing, mood management, and focus on self-care and life balance.    Diagnosis: Axis I: Bipolar, mixed and Post Traumatic Stress Disorder    Axis II: No diagnosis    Heather Bo [name], LCSW 06/07/2020

## 2020-06-12 ENCOUNTER — Other Ambulatory Visit (HOSPITAL_COMMUNITY): Payer: Self-pay | Admitting: Psychiatry

## 2020-06-12 DIAGNOSIS — F4312 Post-traumatic stress disorder, chronic: Secondary | ICD-10-CM

## 2020-06-12 DIAGNOSIS — F3130 Bipolar disorder, current episode depressed, mild or moderate severity, unspecified: Secondary | ICD-10-CM

## 2020-06-28 ENCOUNTER — Ambulatory Visit (INDEPENDENT_AMBULATORY_CARE_PROVIDER_SITE_OTHER): Payer: BC Managed Care – PPO | Admitting: Licensed Clinical Social Worker

## 2020-06-28 ENCOUNTER — Other Ambulatory Visit: Payer: Self-pay

## 2020-06-28 DIAGNOSIS — F4312 Post-traumatic stress disorder, chronic: Secondary | ICD-10-CM

## 2020-06-28 DIAGNOSIS — F3130 Bipolar disorder, current episode depressed, mild or moderate severity, unspecified: Secondary | ICD-10-CM | POA: Diagnosis not present

## 2020-06-28 NOTE — Progress Notes (Signed)
Virtual Visit via Video Note  I connected with Heather Jefferson on 06/28/20 at 10:00 AM EDT by a video enabled telemedicine application and verified that I am speaking with the correct person using two identifiers.  Location: Patient: home Provider: [facility]   I discussed the limitations of evaluation and management by telemedicine and the availability of in person appointments. The patient expressed understanding and agreed to proceed.   The patient was advised to call back or seek an in-person evaluation if the symptoms worsen or if the condition fails to improve as anticipated.  I provided 45 minutes of non-face-to-face time during this encounter.   Colletta Spillers R Brielyn Bosak, LCSW    THERAPIST PROGRESS NOTE  Session Time: 10:00-10:40a  Participation Level: Active  Behavioral Response: Neat and Well GroomedAlertDepressed  Type of Therapy: Individual Therapy  Treatment Goals addressed: Coping  Interventions: CBT and Supportive  Summary: Heather Jefferson is a 49 y.o. female who presents with continuing symptoms related to diagnosis (PTSD, bipolar--depressed).  Pt reports that she is compliant with her medication, fair quality of sleep (still having vivid nightmares), and appetite within normal limits.  Allowed pt to explore current stressors: pt still having some anger directed at mother for past situations: adoption of daughter and abandonment after the adoption.  Allowed pt to explore and express thoughts and feelings surrounding the situation/era of when her mother adopted her newborn child out and then subsequently abandoned pt and sister to live with her boyfriend. Pt and sister went to live with relatives since home that they were living in had no water or electricity. Pt harboring a lot of anger towards mother but feels conflicted because she wants to forgive mother as well.  Discussed forgiveness and how it is a complex process: not just saying the words "I'm sorry" and "I  accept apology". Discussed the path of forgiveness and how being on the path is healing--even before reaching the destination of "forgiveness". Allowed pt to explore her own personal "forgiveness path" and where she feels right now on the journey.   Encouraged self care, social engagement, and life balance.  Suicidal/Homicidal: No  SI, HI, or AVH reported at time of session.  Therapist Response: [name] reports a continuation of symptoms related to depression. Pt reports anxiety symptoms.This is reflective of fluctuating progress.   Plan: Return again in 3 weeks. The ongoing treatment plan includes  continuing to build skills to manage mood, improve stress/anxiety management, emotion regulation, distress tolerance, and behavior modification.    Diagnosis: Axis I: Post Traumatic Stress Disorder, bipolar-depressed    Axis II: No diagnosis    Rachel Bo Ladarian Bonczek, LCSW 06/28/2020

## 2020-07-19 ENCOUNTER — Ambulatory Visit (INDEPENDENT_AMBULATORY_CARE_PROVIDER_SITE_OTHER): Payer: BC Managed Care – PPO | Admitting: Licensed Clinical Social Worker

## 2020-07-19 ENCOUNTER — Other Ambulatory Visit: Payer: Self-pay

## 2020-07-19 DIAGNOSIS — F3130 Bipolar disorder, current episode depressed, mild or moderate severity, unspecified: Secondary | ICD-10-CM | POA: Diagnosis not present

## 2020-07-19 DIAGNOSIS — F4312 Post-traumatic stress disorder, chronic: Secondary | ICD-10-CM

## 2020-07-19 NOTE — Progress Notes (Signed)
Virtual Visit via Video Note  I connected with Heather Jefferson on 07/19/20 at 10:00 AM EDT by a video enabled telemedicine application and verified that I am speaking with the correct person using two identifiers.  Location: Patient: home Provider: [facility]   I discussed the limitations of evaluation and management by telemedicine and the availability of in person appointments. The patient expressed understanding and agreed to proceed.  The patient was advised to call back or seek an in-person evaluation if the symptoms worsen or if the condition fails to improve as anticipated.  I provided 20 minutes of non-face-to-face time during this encounter.   Tinna Kolker R Dorma Altman, LCSW    THERAPIST PROGRESS NOTE  Session Time: 8:00-8:20a  Participation Level: Active  Behavioral Response: NeatAlertAnxious  Type of Therapy: Individual Therapy  Treatment Goals addressed: Anxiety and Coping  Interventions: Supportive  Summary: Heather Jefferson is a 49 y.o. female who presents with stable mood and situational anxiety symptoms.  Pt reports that she is compliant with medication and that she is getting good quality and quantity of sleep. Pt reports that she still has some crying spells occasionally. Pt reports occasional nightmares.  Allowed pt space to express thoughts and feelings about current life events.  Pt is feeling good about recently attending a wedding--reports that her mother initially didn't recognize her.  Pt reports that she is concerned about the health of her daughter and husband currently--both home sick today.   Pt reports feeling excited that a sibling that she found on [email or web address] reported to her that she will be coming for a visit soon. Pt is fearful that she will be disappointed if the sister makes a change in her plans.   Discussed/reviewed CBT-based anxiety management including deep breathing, mindfulness exercises, grounding exercises, physical activity, and social  engagement. Pt admits that she does feel better when she is actively doing some of these activities. Encouraged her to be regular with the activities. Discussed importance of overall life balance and overall self-awareness/reflection.   Suicidal/Homicidal: No  SI, HI, or AVH reported at time of session.  Therapist Response: [name] continues to progress well with overall self-awareness and self understanding. Developments continue in the areas of family and relational functioning. [name] continues to gain greater mastery over traumatic stimuli and fears. Treatment continues to show good evolution and development.  Plan: Return again in 4 weeks. The ongoing treatment plan includes maintaining current levels of progress and continuing to build skills to manage mood, improve stress/anxiety management, emotion regulation, distress tolerance, and behavior modification.   Diagnosis: Axis I: Bipolar, Depressed and Post Traumatic Stress Disorder    Axis II: No diagnosis    [place], LCSW 07/19/2020

## 2020-07-21 ENCOUNTER — Telehealth (INDEPENDENT_AMBULATORY_CARE_PROVIDER_SITE_OTHER): Payer: BC Managed Care – PPO | Admitting: Psychiatry

## 2020-07-21 ENCOUNTER — Other Ambulatory Visit: Payer: Self-pay

## 2020-07-21 ENCOUNTER — Encounter (HOSPITAL_COMMUNITY): Payer: Self-pay | Admitting: Psychiatry

## 2020-07-21 DIAGNOSIS — F4312 Post-traumatic stress disorder, chronic: Secondary | ICD-10-CM | POA: Diagnosis not present

## 2020-07-21 DIAGNOSIS — F3130 Bipolar disorder, current episode depressed, mild or moderate severity, unspecified: Secondary | ICD-10-CM | POA: Diagnosis not present

## 2020-07-21 MED ORDER — ARIPIPRAZOLE 20 MG PO TABS: 20.0000 mg | ORAL_TABLET | Freq: Every day | ORAL | 0 refills | Status: DC

## 2020-07-21 MED ORDER — TEMAZEPAM 30 MG PO CAPS: 30.0000 mg | ORAL_CAPSULE | Freq: Every evening | ORAL | 2 refills | Status: DC | PRN

## 2020-07-21 MED ORDER — DESVENLAFAXINE SUCCINATE ER 100 MG PO TB24: 100.0000 mg | ORAL_TABLET | Freq: Every day | ORAL | 3 refills | Status: DC

## 2020-07-21 MED ORDER — TOPIRAMATE 100 MG PO TABS: 100.0000 mg | ORAL_TABLET | Freq: Every day | ORAL | 0 refills | Status: DC

## 2020-07-21 NOTE — Progress Notes (Signed)
[place] MD OP Progress Note Virtual Visit via Video Note  I connected with Heather Jefferson on 07/21/20 at 11:40 AM EDT by a video enabled telemedicine application and verified that I am speaking with the correct person using two identifiers.  Location: Patient: Home Provider: Clinic   I discussed the limitations of evaluation and management by telemedicine and the availability of in person appointments. The patient expressed understanding and agreed to proceed.  I provided 14 minutes of non-face-to-face time during this encounter.    07/21/2020 11:52 AM Heather Jefferson  MRN:  [id number]  Chief Complaint:  " I am doing better, things are leveling out."  HPI: Patient reported things are better now.  She is not as stressed and anxious as she was.  Her sleep is improved with the help of increased dose of temazepam.  Her nightmares have improved.  She feels that talking to her therapist has been very helpful and she is overall noticing a lot of improvement in herself. She feels she is on the right track for now.  She agreed for a 61-month follow-up.   Visit Diagnosis: Bipolar I disorder, most recent episode depressed ([place]) - Plan: ARIPiprazole (ABILIFY) 20 MG tablet, desvenlafaxine (PRISTIQ) 100 MG 24 hr tablet, temazepam (RESTORIL) 30 MG capsule, topiramate (TOPAMAX) 100 MG tablet  Chronic post-traumatic stress disorder (PTSD) - Plan: ARIPiprazole (ABILIFY) 20 MG tablet, desvenlafaxine (PRISTIQ) 100 MG 24 hr tablet, topiramate (TOPAMAX) 100 MG tablet   Past Psychiatric History: Bipolar I disorder, most recent episode depressed ([facility]) - Plan: ARIPiprazole (ABILIFY) 20 MG tablet, desvenlafaxine (PRISTIQ) 100 MG 24 hr tablet, temazepam (RESTORIL) 30 MG capsule, topiramate (TOPAMAX) 100 MG tablet  Chronic post-traumatic stress disorder (PTSD) - Plan: ARIPiprazole (ABILIFY) 20 MG tablet, desvenlafaxine (PRISTIQ) 100 MG 24 hr tablet, topiramate (TOPAMAX) 100 MG tablet   Past Medical History:   Past Medical History:  Diagnosis Date  . ADHD (attention deficit hyperactivity disorder)   . Allergy   . Anemia   . Anxiety   . Depression   . Diabetes mellitus, type II ([place])   . Fibromyalgia   . Generalized anxiety disorder   . Headache   . HIV infection ([place])   . Hypertension   . Hypothyroidism   . Major depressive disorder, recurrent episode, moderate ([place])   . Migraine with acute onset aura   . Persistent disorder of initiating or maintaining sleep   . PTSD (post-traumatic stress disorder)   . Restless leg   . Seizure disorder ([place])   . Thyroid disease   . Vitamin D deficiency     Past Surgical History:  Procedure Laterality Date  . COLONOSCOPY WITH PROPOFOL N/A 01/29/2020   Procedure: COLONOSCOPY WITH PROPOFOL;  Surgeon: Jonathon Bellows, MD;  Location: [facility];  Service: Gastroenterology;  Laterality: N/A;  . EYE SURGERY    . TUBAL LIGATION      Family Psychiatric History: depression, anxiety in siblings  Family History:  Family History  Problem Relation Age of Onset  . Hypertension Mother   . Heart attack Mother   . CAD Mother   . Diabetes Father   . Hypertension Father   . Hypertension Sister   . Anxiety disorder Brother   . Depression Brother     Social History:  Social History   Socioeconomic History  . Marital status: Married    Spouse name: roberto  . Number of children: 2  . Years of education: Not on file  . Highest education level: 10th grade  Occupational History  . Occupation: disabled  Tobacco Use  . Smoking status: Never Smoker  . Smokeless tobacco: Never Used  Vaping Use  . Vaping Use: Never used  Substance and Sexual Activity  . Alcohol use: Yes    Alcohol/week: 0.0 standard drinks    Comment: none last 24 hrs  . Drug use: No  . Sexual activity: Not Currently    Partners: Male    Birth control/protection: Other-see comments, I.U.D.    Comment: husband has ED  Other Topics Concern  . 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 Difficulty of Paying Living Expenses: Not hard at all  Food Insecurity: No Food Insecurity  . Worried About Charity fundraiser in the Last Year: Never true  . Ran Out of Food in the Last Year: Never true  Transportation Needs: No Transportation Needs  . Lack of Transportation (Medical): No  . Lack of Transportation (Non-Medical): No  Physical Activity: Inactive  . Days of Exercise per Week: 0 days  . Minutes of Exercise per Session: 0 min  Stress: Stress Concern Present  . Feeling of Stress : Rather much  Social Connections: Socially Integrated  . Frequency of Communication with Friends and Family: More than three times a week  . Frequency of Social Gatherings with Friends and Family: Once a week  . Attends Religious Services: More than 4 times per year  . Active Member of Clubs or Organizations: Yes  . Attends Archivist Meetings: More than 4 times per year  . Marital Status: Married    Allergies:  Allergies  Allergen Reactions  . Amoxicillin-Pot Clavulanate Hives  . Losartan Cough  . Metformin And Related Diarrhea and Nausea And Vomiting    Metabolic Disorder Labs: Lab Results  Component Value Date   HGBA1C 5.8 (H) 05/10/2020   MPG 120 05/10/2020   MPG 103 05/04/2019   No results found for: PROLACTIN Lab Results  Component Value Date   CHOL 212 (H) 05/10/2020   TRIG 231 (H) 05/10/2020   HDL 53 05/10/2020   CHOLHDL 4.0 05/10/2020   VLDL 38 (H) 10/18/2016   LDLCALC 124 (H) 05/10/2020   LDLCALC 125 (H) 05/04/2019   Lab Results  Component Value Date   TSH 3.35 05/10/2020   TSH 2.22 05/04/2019    Therapeutic Level Labs: No results found for: LITHIUM No results found for: VALPROATE No components found for:  CBMZ  Current Medications: Current Outpatient Medications   Medication Sig Dispense Refill  . amLODipine (NORVASC) 5 MG tablet Take 5 mg by mouth daily.    . ARIPiprazole (ABILIFY) 20 MG tablet Take 1 tablet (20 mg total) by mouth daily. 90 tablet 0  . desvenlafaxine (PRISTIQ) 100 MG 24 hr tablet Take 1 tablet (100 mg total) by mouth daily. 90 tablet 3  . hydroxychloroquine (PLAQUENIL) 200 MG tablet Take 200 mg by mouth 2 (two) times daily.    Marland Kitchen levothyroxine (SYNTHROID) 25 MCG tablet TAKE 1 TABLET DAILY AND 2 TABLETS (50 MCG)  ON SUNDAYS 102 tablet 3  . MIRENA, 52 MG, 20 MCG/24HR IUD     . prednisoLONE acetate (PRED FORTE) 1 % ophthalmic suspension Place 1 drop into the right eye daily.     . SUMAtriptan (IMITREX) 20 MG/ACT nasal spray  Place 1 spray (20 mg total) into the nose every 2 (two) hours as needed for migraine. 3 each 2  . temazepam (RESTORIL) 30 MG capsule Take 1 capsule (30 mg total) by mouth at bedtime as needed for sleep. 30 capsule 2  . topiramate (TOPAMAX) 100 MG tablet Take 1 tablet (100 mg total) by mouth daily. 90 tablet 0  . Vitamin D, Ergocalciferol, (DRISDOL) 1.25 MG (50000 UNIT) CAPS capsule Take 1 capsule (50,000 Units total) by mouth every 7 (seven) days. 12 capsule 1   No current facility-administered medications for this visit.     Psychiatric Specialty Exam: ROS  There were no vitals taken for this visit.There is no height or weight on file to calculate BMI.  General Appearance: Fairly Groomed  Eye Contact:  Good  Speech:  Clear and Coherent  Volume:  Normal  Mood:  Euthymic  Affect:  Congruent  Thought Process:  Goal Directed, Linear and Descriptions of Associations: Intact  Orientation:  Full (Time, Place, and Person)  Thought Content: Logical   Suicidal Thoughts:  No  Homicidal Thoughts:  No  Memory:  Recent;   Good Remote;   Good  Judgement:  Fair  Insight:  Fair  Psychomotor Activity:  Negative  Concentration:  Concentration: Good and Attention Span: Good  Recall:  Good  Fund of Knowledge: Good   Language: Good  Akathisia:  No  Handed:  Right  AIMS (if indicated): not done  Assets:  Communication Skills Desire for Improvement Financial Resources/Insurance Housing Social Support Transportation  ADL's:  Intact  Cognition: WNL  Sleep:  Good   Screenings: GAD-7     Office Visit from 05/10/2020 in [facility] Office Visit from 08/04/2019 in [facility] Office Visit from 07/08/2018 in [facility] Office Visit from 05/07/2018 in [facility] Office Visit from 02/04/2018 in [facility]  Total GAD-7 Score 18 16 16 6 14     PHQ2-9     Office Visit from 05/10/2020 in [facility] Office Visit from 02/04/2020 in [facility] Office Visit from 01/19/2020 in [place] from [facility] Visit from 11/04/2019 in [facility]  PHQ-2 Total Score 5 6 3 6 3   PHQ-9 Total Score 18 19 17 24 20        Assessment and Plan: Patient appears to be doing better compared to last visit.  She likes her therapist.   1. Bipolar I disorder, most recent episode depressed ([place])  - ARIPiprazole (ABILIFY) 20 MG tablet; Take 1 tablet (20 mg total) by mouth daily.  Dispense: 90 tablet; Refill: 0 - desvenlafaxine (PRISTIQ) 100 MG 24 hr tablet; Take 1 tablet (100 mg total) by mouth daily.  Dispense: 90 tablet; Refill: 3 - temazepam (RESTORIL) 30 MG capsule; Take 1 capsule (30 mg total) by mouth at bedtime as needed for sleep.  Dispense: 30 capsule; Refill: 2 - topiramate (TOPAMAX) 100 MG tablet; Take 1 tablet (100 mg total) by mouth daily.  Dispense: 90 tablet; Refill: 0  2. Chronic post-traumatic stress disorder (PTSD)  - ARIPiprazole (ABILIFY) 20 MG tablet; Take 1 tablet (20 mg total) by mouth daily.  Dispense: 90 tablet; Refill: 0 - desvenlafaxine (PRISTIQ) 100 MG 24 hr tablet; Take 1  tablet (100 mg total) by mouth daily.  Dispense: 90 tablet; Refill: 3 - topiramate (TOPAMAX) 100 MG tablet; Take 1 tablet (100 mg total) by  mouth daily.  Dispense: 90 tablet; Refill: 0   Continue individual therapy. Follow up in 3 months.   [place] Crane, MD 07/21/2020, 11:52 AM
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
## 2020-08-12 ENCOUNTER — Other Ambulatory Visit: Payer: Self-pay

## 2020-08-12 ENCOUNTER — Encounter: Payer: Self-pay | Admitting: Family Medicine

## 2020-08-12 ENCOUNTER — Ambulatory Visit (INDEPENDENT_AMBULATORY_CARE_PROVIDER_SITE_OTHER): Payer: BC Managed Care – PPO | Admitting: Family Medicine

## 2020-08-12 VITALS — BP 140/88 | HR 80 | Temp 98.0°F | Resp 16 | Ht 62.0 in | Wt 292.5 lb

## 2020-08-12 DIAGNOSIS — M797 Fibromyalgia: Secondary | ICD-10-CM

## 2020-08-12 DIAGNOSIS — E039 Hypothyroidism, unspecified: Secondary | ICD-10-CM | POA: Diagnosis not present

## 2020-08-12 DIAGNOSIS — Z23 Encounter for immunization: Secondary | ICD-10-CM

## 2020-08-12 DIAGNOSIS — F331 Major depressive disorder, recurrent, moderate: Secondary | ICD-10-CM | POA: Diagnosis not present

## 2020-08-12 DIAGNOSIS — M059 Rheumatoid arthritis with rheumatoid factor, unspecified: Secondary | ICD-10-CM | POA: Diagnosis not present

## 2020-08-12 DIAGNOSIS — E559 Vitamin D deficiency, unspecified: Secondary | ICD-10-CM

## 2020-08-12 DIAGNOSIS — E785 Hyperlipidemia, unspecified: Secondary | ICD-10-CM | POA: Diagnosis not present

## 2020-08-12 DIAGNOSIS — I10 Essential (primary) hypertension: Secondary | ICD-10-CM | POA: Diagnosis not present

## 2020-08-12 DIAGNOSIS — E8881 Metabolic syndrome: Secondary | ICD-10-CM | POA: Diagnosis not present

## 2020-08-12 DIAGNOSIS — G43009 Migraine without aura, not intractable, without status migrainosus: Secondary | ICD-10-CM

## 2020-08-12 DIAGNOSIS — F3160 Bipolar disorder, current episode mixed, unspecified: Secondary | ICD-10-CM

## 2020-08-12 DIAGNOSIS — E88819 Insulin resistance, unspecified: Secondary | ICD-10-CM

## 2020-08-16 ENCOUNTER — Ambulatory Visit (INDEPENDENT_AMBULATORY_CARE_PROVIDER_SITE_OTHER): Payer: BC Managed Care – PPO | Admitting: Licensed Clinical Social Worker

## 2020-08-16 ENCOUNTER — Other Ambulatory Visit: Payer: Self-pay

## 2020-08-16 DIAGNOSIS — F3130 Bipolar disorder, current episode depressed, mild or moderate severity, unspecified: Secondary | ICD-10-CM | POA: Diagnosis not present

## 2020-08-16 DIAGNOSIS — F4312 Post-traumatic stress disorder, chronic: Secondary | ICD-10-CM | POA: Diagnosis not present

## 2020-08-16 NOTE — Progress Notes (Signed)
Virtual Visit via Video Note  I connected with Heather Jefferson on 08/16/20 at 10:00 AM EST by a video enabled telemedicine application and verified that I am speaking with the correct person using two identifiers.  Location: Patient: home Provider: remote office [place], [place])  Session Participants:  Patient--Heather Jefferson Counselor--Alexes Menchaca, MSW, LCSW    I discussed the limitations of evaluation and management by telemedicine and the availability of in person appointments. The patient expressed understanding and agreed to proceed.   The patient was advised to call back or seek an in-person evaluation if the symptoms worsen or if the condition fails to improve as anticipated.  I provided 45 minutes of non-face-to-face time during this encounter.   Masyn Fullam R Kyrianna Barletta, LCSW    THERAPIST PROGRESS NOTE  Session Time: 10:00-10:45a  Participation Level: Active  Behavioral Response: NeatAlertAnxious  Type of Therapy: Individual Therapy  Treatment Goals addressed: Coping  Interventions: CBT and Supportive  Summary: Heather Jefferson is a 49 y.o. female who presents with continuing symptoms related to bipolar disorder and PTSD diagnoses. Pt reports that her mood has been "lower" recently and that she is continuing to have thoughts about her older daughter (adoption).   Allowed pt to explore and express thoughts and feelings about the adoption and how she continues to have thoughts about it regularly.  Encouraged pt to use a thought/feelings journal to process through some of the feelings that she is struggling with. Discussed current family relationships and events that pt is looking forward to (Thanksgiving with her children).  Explored pts belief system and strong family values.  Encouraged self care and focusing on overall physical and emotional wellness.   Suicidal/Homicidal: No  Therapist Response: [name] reports that symptoms today are not better  and not worse. This is reflective of intermittent/fluctuating progress. Reviewed symptom management interventions. Treatment to continue as indicated  Plan: Return again in 4 weeks. The ongoing treatment plan includes maintaining current levels of progress and continuing to build skills to manage mood, improve stress/anxiety management, emotion regulation, distress tolerance, and behavior modification.   Diagnosis: Axis I: Bipolar, mixed and Post Traumatic Stress Disorder    Axis II: No diagnosis    Heather Bo Etha Stambaugh, LCSW 08/16/2020

## 2020-09-02 DIAGNOSIS — S0501XA Injury of conjunctiva and corneal abrasion without foreign body, right eye, initial encounter: Secondary | ICD-10-CM | POA: Diagnosis not present

## 2020-09-06 ENCOUNTER — Other Ambulatory Visit: Payer: Self-pay | Admitting: Family Medicine

## 2020-09-06 ENCOUNTER — Telehealth: Payer: Self-pay

## 2020-09-06 DIAGNOSIS — E213 Hyperparathyroidism, unspecified: Secondary | ICD-10-CM

## 2020-09-06 DIAGNOSIS — E039 Hypothyroidism, unspecified: Secondary | ICD-10-CM

## 2020-09-06 DIAGNOSIS — Z79899 Other long term (current) drug therapy: Secondary | ICD-10-CM

## 2020-09-06 NOTE — Telephone Encounter (Signed)
Copied from [place] [id number]. Topic: General - Other >> Sep 06, 2020 11:36 AM Celene Kras wrote: Reason for CRM: Pt calling and is requesting to speak with PCP regarding a test that she is supposed to have done for her thyroid before her next appt. Please advise.

## 2020-09-07 NOTE — Telephone Encounter (Signed)
Called pt and let her know. Pt gave verbal understanding.

## 2020-09-08 DIAGNOSIS — E213 Hyperparathyroidism, unspecified: Secondary | ICD-10-CM | POA: Diagnosis not present

## 2020-09-08 DIAGNOSIS — Z79899 Other long term (current) drug therapy: Secondary | ICD-10-CM | POA: Diagnosis not present

## 2020-09-08 DIAGNOSIS — E039 Hypothyroidism, unspecified: Secondary | ICD-10-CM | POA: Diagnosis not present

## 2020-09-09 ENCOUNTER — Other Ambulatory Visit: Payer: Self-pay | Admitting: Family Medicine

## 2020-09-09 LAB — COMPLETE METABOLIC PANEL WITH GFR
AG Ratio: 1.6 (calc) (ref 1.0–2.5)
ALT: 17 U/L (ref 6–29)
AST: 17 U/L (ref 10–35)
Albumin: 4.5 g/dL (ref 3.6–5.1)
Alkaline phosphatase (APISO): 130 U/L (ref 37–153)
BUN: 9 mg/dL (ref 7–25)
CO2: 25 mmol/L (ref 20–32)
Calcium: 9.6 mg/dL (ref 8.6–10.4)
Chloride: 104 mmol/L (ref 98–110)
Creat: 0.73 mg/dL (ref 0.50–1.05)
GFR, Est African American: 111 mL/min/{1.73_m2} (ref >=60)
GFR, Est Non African American: 96 mL/min/{1.73_m2} (ref >=60)
Globulin: 2.8 g/dL (calc) (ref 1.9–3.7)
Glucose, Bld: 95 mg/dL (ref 65–99)
Potassium: 4.1 mmol/L (ref 3.5–5.3)
Sodium: 139 mmol/L (ref 135–146)
Total Bilirubin: 0.7 mg/dL (ref 0.2–1.2)
Total Protein: 7.3 g/dL (ref 6.1–8.1)

## 2020-09-09 LAB — PARATHYROID HORMONE, INTACT (NO CA): PTH: 113 pg/mL — ABNORMAL HIGH (ref 14–64)

## 2020-09-09 LAB — TSH: TSH: 5.06 mIU/L — ABNORMAL HIGH

## 2020-09-13 ENCOUNTER — Ambulatory Visit (INDEPENDENT_AMBULATORY_CARE_PROVIDER_SITE_OTHER): Payer: BC Managed Care – PPO | Admitting: Licensed Clinical Social Worker

## 2020-09-13 ENCOUNTER — Other Ambulatory Visit: Payer: Self-pay

## 2020-09-13 DIAGNOSIS — F4312 Post-traumatic stress disorder, chronic: Secondary | ICD-10-CM

## 2020-09-13 DIAGNOSIS — F3130 Bipolar disorder, current episode depressed, mild or moderate severity, unspecified: Secondary | ICD-10-CM

## 2020-09-13 NOTE — Progress Notes (Signed)
Virtual Visit via Video Note  I connected with Heather Jefferson on 09/13/20 at 10:00 AM EST by a video enabled telemedicine application and verified that I am speaking with the correct person using two identifiers.  Location: Patient: home Provider: [facility]   I discussed the limitations of evaluation and management by telemedicine and the availability of in person appointments. The patient expressed understanding and agreed to proceed.  The patient was advised to call back or seek an in-person evaluation if the symptoms worsen or if the condition fails to improve as anticipated.  I provided 45 minutes of non-face-to-face time during this encounter.   Mindi Akerson R Cammi Consalvo, LCSW    THERAPIST PROGRESS NOTE  Session Time: 10-10:45a  Participation Level: Active  Behavioral Response: NAAlertAnxious and Depressed  Type of Therapy: Individual Therapy  Treatment Goals addressed: Anxiety and Coping  Interventions: Supportive and Family Systems  Summary: Heather Jefferson is a 49 y.o. female who presents with improving symptoms consistent with PTSD and bipolar disorder diagnoses. Pt reports that mood is fluctuating but improved since last session. Pt feels that she is managing stress/anxiety better.   Allowed pt to explore thoughts and feelings about current life events and pt feels that she is ready to make a tiktok with a friend to try and find her daughter "I just want to do all I can do to find her". Pt recognizes that if she does find her, the emotions could go in many directions.   Encouraged pt to continue with positive family and peer interaction, self care, and focusing on overall emotional wellness.  Suicidal/Homicidal: No  SI, HI, or AVH reported at time of session.  Therapist Response: Jana Half  Plan: Return again in 3 weeks.  Diagnosis: Axis I: Bipolar, Depressed and Post Traumatic Stress Disorder    Axis II: No diagnosis    [place],  LCSW 09/13/2020

## 2020-10-07 ENCOUNTER — Ambulatory Visit (INDEPENDENT_AMBULATORY_CARE_PROVIDER_SITE_OTHER): Payer: BC Managed Care – PPO | Admitting: Licensed Clinical Social Worker

## 2020-10-07 ENCOUNTER — Other Ambulatory Visit: Payer: Self-pay

## 2020-10-07 DIAGNOSIS — F3130 Bipolar disorder, current episode depressed, mild or moderate severity, unspecified: Secondary | ICD-10-CM | POA: Diagnosis not present

## 2020-10-07 DIAGNOSIS — F4312 Post-traumatic stress disorder, chronic: Secondary | ICD-10-CM

## 2020-10-08 NOTE — Progress Notes (Signed)
Virtual Visit via Video Note  I connected with Heather Jefferson on 10/07/20 at 10:00 AM EST by a video enabled telemedicine application and verified that I am speaking with the correct person using two identifiers.  Location: Patient: home Provider: remote office [place], [place])   I discussed the limitations of evaluation and management by telemedicine and the availability of in person appointments. The patient expressed understanding and agreed to proceed.  The patient was advised to call back or seek an in-person evaluation if the symptoms worsen or if the condition fails to improve as anticipated.  I provided 45 minutes of non-face-to-face time during this encounter.   Angelica Frandsen R Rashana Andrew, LCSW    THERAPIST PROGRESS NOTE  Session Time: 11-11:45a  Participation Level: Active  Behavioral Response: Neat and Well GroomedAlertAnxious and Depressed  Type of Therapy: Individual Therapy  Treatment Goals addressed: Anxiety and Coping  Interventions: Supportive, Reframing and Other: trauma focused  Summary: Heather Jefferson is a 50 y.o. female who presents with improving symptoms related to bipolar and PTSD diagnoses. Pt reporting that mood is stable and that she is getting good quality and quantity of sleep.   Processed through some thoughts, feelings, and concerns pt had about self as a teenager and current self.  "I can forgive my mom but its hard to forgive myself". Discussed different childhood paths that happened--and allowed pt to focus on what elements she could control and what elements were out of her control.   Continued to encourage self care, life balance, and positive social engagement.   Discussed what "present [place]" would say to "child [name]". Discussed possibly writing a letter in the future as part of overall self-forgiveness.  Suicidal/Homicidal: No  Therapist Response: Heather Jefferson is continuing to re-work thoughts and feelings associated with childhood  trauma.   Plan: Return again in 4 weeks.  Diagnosis: Axis I: Bipolar, Depressed and Post Traumatic Stress Disorder    Axis II: No diagnosis    [place], LCSW 10/08/2020

## 2020-10-14 ENCOUNTER — Encounter (HOSPITAL_COMMUNITY): Payer: Self-pay | Admitting: Psychiatry

## 2020-10-14 ENCOUNTER — Telehealth (INDEPENDENT_AMBULATORY_CARE_PROVIDER_SITE_OTHER): Payer: BC Managed Care – PPO | Admitting: Psychiatry

## 2020-10-14 ENCOUNTER — Other Ambulatory Visit: Payer: Self-pay

## 2020-10-14 DIAGNOSIS — F4312 Post-traumatic stress disorder, chronic: Secondary | ICD-10-CM

## 2020-10-14 DIAGNOSIS — F3130 Bipolar disorder, current episode depressed, mild or moderate severity, unspecified: Secondary | ICD-10-CM

## 2020-10-14 MED ORDER — TEMAZEPAM 30 MG PO CAPS
30.0000 mg | ORAL_CAPSULE | Freq: Every evening | ORAL | 2 refills | Status: DC | PRN
Start: 2020-10-14 — End: 2020-11-25

## 2020-10-14 MED ORDER — TOPIRAMATE 100 MG PO TABS
100.0000 mg | ORAL_TABLET | Freq: Every day | ORAL | 0 refills | Status: DC
Start: 2020-10-14 — End: 2020-11-25

## 2020-10-14 MED ORDER — ARIPIPRAZOLE 20 MG PO TABS: 20.0000 mg | ORAL_TABLET | Freq: Every day | ORAL | 0 refills | Status: DC

## 2020-10-14 MED ORDER — BUPROPION HCL ER (XL) 150 MG PO TB24: 150.0000 mg | ORAL_TABLET | ORAL | 0 refills | Status: DC

## 2020-10-14 MED ORDER — DESVENLAFAXINE SUCCINATE ER 100 MG PO TB24
100.0000 mg | ORAL_TABLET | Freq: Every day | ORAL | 3 refills | Status: DC
Start: 2020-10-14 — End: 2020-11-25

## 2020-10-14 NOTE — Progress Notes (Signed)
[place] MD OP Progress Note  Virtual Visit via Telephone Note  I connected with Heather Jefferson on 10/14/20 at 11:40 AM EST by telephone and verified that I am speaking with the correct person using two identifiers.  Location: Patient: home Provider: home office   I discussed the limitations, risks, security and privacy concerns of performing an evaluation and management service by telephone and the availability of in person appointments. I also discussed with the patient that there may be a patient responsible charge related to this service. The patient expressed understanding and agreed to proceed.   I provided 16 minutes of non-face-to-face time during this encounter.      10/14/2020 11:39 AM Heather Jefferson  MRN:  [id number]  Chief Complaint:  " I have been feeling quite depressed since last month."  HPI: Pt informed that she has been feeling depressed since December. She felt very tearful and has found it hard to hide from her family. She does not feel like doing much and stays isolative at home. She has frequent crying spells. She had a rough time around Christmas and new year's eve. She is glad that the holidays are over. She has not had any suicidal ideations. She has been seeing therapist regularly on a monthly basis and they had have suggested that she should discuss her medication options with the Probation officer.  Pt informed she has taken Wellbutrin in the past and did well on it. She is not sure if it can be safely added to her current regimen now or not.  Writer informed her Wellbutrin Xl can be safely added to her current regimen for optimal symptoms control. Potential side effects of medication and risks vs benefits of treatment vs non-treatment were explained and discussed. All questions were answered.  Pt was agreeable with this recommendation.   Visit Diagnosis: Bipolar I disorder, most recent episode depressed ([place])  Chronic post-traumatic stress disorder  (PTSD)   Past Psychiatric History: Bipolar I disorder, most recent episode depressed ([place])  Chronic post-traumatic stress disorder (PTSD)   Past Medical History:  Past Medical History:  Diagnosis Date   ADHD (attention deficit hyperactivity disorder)    Allergy    Anemia    Anxiety    Depression    Diabetes mellitus, type II ([place])    Fibromyalgia    Generalized anxiety disorder    Headache    HIV infection ([place])    Hypertension    Hypothyroidism    Major depressive disorder, recurrent episode, moderate (HCC)    Migraine with acute onset aura    Persistent disorder of initiating or maintaining sleep    PTSD (post-traumatic stress disorder)    Restless leg    Seizure disorder ([place])    Thyroid disease    Vitamin D deficiency     Past Surgical History:  Procedure Laterality Date   COLONOSCOPY WITH PROPOFOL N/A 01/29/2020   Procedure: COLONOSCOPY WITH PROPOFOL;  Surgeon: Jonathon Bellows, MD;  Location[facility] ENDOSCOPY;  Service: Gastroenterology;  Laterality: N/A;   EYE SURGERY     TUBAL LIGATION      Family Psychiatric History: depression, anxiety in siblings  Family History:  Family History  Problem Relation Age of Onset   Hypertension Mother    Heart attack Mother    CAD Mother    Diabetes Father    Hypertension Father    Hypertension Sister    Anxiety disorder Brother    Depression Brother     Social History:  Social  History   Socioeconomic History   Marital status: Married    Spouse name: roberto   Number of children: 2   Years of education: Not on file   Highest education level: 10th grade  Occupational History   Occupation: disabled  Tobacco Use   Smoking status: Never Smoker   Smokeless tobacco: Never Used  Scientific laboratory technician Use: Never used  Substance and Sexual Activity   Alcohol use: Yes    Alcohol/week: 0.0 standard drinks    Comment: none last 24 hrs   Drug use: No   Sexual activity: Not Currently     Partners: Male    Birth control/protection: Other-see comments, I.U.D.    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   Social Determinants of Health   Financial Resource Strain: Low Risk    Difficulty of Paying Living Expenses: Not hard at all  Food Insecurity: No Food Insecurity   Worried About Charity fundraiser in the Last Year: Never true   Arboriculturist in the Last Year: Never true  Transportation Needs: No Transportation Needs   Lack of Transportation (Medical): No   Lack of Transportation (Non-Medical): No  Physical Activity: Inactive   Days of Exercise per Week: 0 days   Minutes of Exercise per Session: 0 min  Stress: Stress Concern Present   Feeling of Stress : Rather much  Social Connections: Socially Integrated   Frequency of Communication with Friends and Family: More than three times a week   Frequency of Social Gatherings with Friends and Family: Once a week   Attends Religious Services: More than 4 times per year   Active Member of [employer] or Organizations: Yes   Attends Archivist Meetings: More than 4 times per year   Marital Status: Married    Allergies:  Allergies  Allergen Reactions   Amoxicillin-Pot Clavulanate Hives   Losartan Cough   Metformin And Related Diarrhea and Nausea And Vomiting    Metabolic Disorder Labs: Lab Results  Component Value Date   HGBA1C 5.8 (H) 05/10/2020   MPG 120 05/10/2020   MPG 103 05/04/2019   No results found for: PROLACTIN Lab Results  Component Value Date   CHOL 212 (H) 05/10/2020   TRIG 231 (H) 05/10/2020   HDL 53 05/10/2020   CHOLHDL 4.0 05/10/2020   VLDL 38 (H) 10/18/2016   LDLCALC 124 (H) 05/10/2020   LDLCALC 125 (H) 05/04/2019   Lab Results  Component Value Date   TSH 5.06 (H) 09/08/2020   TSH 3.35 05/10/2020     Therapeutic Level Labs: No results found for: LITHIUM No results found for: VALPROATE No components found for:  CBMZ  Current Medications: Current Outpatient Medications  Medication Sig Dispense Refill   amLODipine (NORVASC) 5 MG tablet Take 5 mg by mouth daily.     ARIPiprazole (ABILIFY) 20 MG tablet Take 1 tablet (20 mg total) by mouth daily. 90 tablet 0   desvenlafaxine (PRISTIQ) 100 MG 24 hr tablet Take 1 tablet (100 mg total) by mouth daily. 90 tablet 3   hydroxychloroquine (PLAQUENIL) 200 MG tablet Take 200 mg by mouth 2 (two) times daily.     levothyroxine (SYNTHROID) 25 MCG tablet TAKE 1 TABLET DAILY AND 2 TABLETS (50 MCG)  ON SUNDAYS 102 tablet 3  MIRENA, 52 MG, 20 MCG/24HR IUD      prednisoLONE acetate (PRED FORTE) 1 % ophthalmic suspension Place 1 drop into the right eye daily.      pregabalin (LYRICA) 50 MG capsule Take 50 mg by mouth 3 (three) times daily.     SUMAtriptan (IMITREX) 20 MG/ACT nasal spray Place 1 spray (20 mg total) into the nose every 2 (two) hours as needed for migraine. 3 each 2   temazepam (RESTORIL) 30 MG capsule Take 1 capsule (30 mg total) by mouth at bedtime as needed for sleep. 30 capsule 2   topiramate (TOPAMAX) 100 MG tablet Take 1 tablet (100 mg total) by mouth daily. 90 tablet 0   Vitamin D, Ergocalciferol, (DRISDOL) 1.25 MG (50000 UNIT) CAPS capsule Take 1 capsule (50,000 Units total) by mouth every 7 (seven) days. 12 capsule 1   No current facility-administered medications for this visit.     Psychiatric Specialty Exam: ROS  There were no vitals taken for this visit.There is no height or weight on file to calculate BMI.  General Appearance: unable to assess due to phone visit  Eye Contact:  unable to assess due to phone visit  Speech:  Clear and Coherent  Volume:  Normal  Mood:  Depressed and Dysphoric  Affect:  Congruent  Thought Process:  Goal Directed, Linear and Descriptions of Associations: Intact  Orientation:   Full (Time, Place, and Person)  Thought Content: Logical   Suicidal Thoughts:  No  Homicidal Thoughts:  No  Memory:  Recent;   Good Remote;   Good  Judgement:  Fair  Insight:  Fair  Psychomotor Activity:  Negative  Concentration:  Concentration: Good and Attention Span: Good  Recall:  Good  Fund of Knowledge: Good  Language: Good  Akathisia:  No  Handed:  Right  AIMS (if indicated): not done  Assets:  Communication Skills Desire for Improvement Financial Resources/Insurance Housing Social Support Transportation  ADL's:  Intact  Cognition: WNL  Sleep:  Good   Screenings: GAD-7   Flowsheet Row Office Visit from 05/10/2020 in [facility] Office Visit from 08/04/2019 in [facility] Office Visit from 07/08/2018 in [facility] Office Visit from 05/07/2018 in [facility] Office Visit from 02/04/2018 in [facility]  Total GAD-7 Score 18 16 16 6 14     PHQ2-9   [place] Office Visit from 08/12/2020 in [facility] Office Visit from 05/10/2020 in [facility] Office Visit from 02/04/2020 in [facility] Office Visit from 01/19/2020 in [place] from 12/03/2019 in [facility]  PHQ-2 Total Score 4 5 6 3 6   PHQ-9 Total Score 15 18 19 17 24        Assessment and Plan: Pt has noticed increased depressive symptoms recently, wants to see if her medication regimen can be adjusted or augmented. Pt was agreeable to the recommendation of adding Wellbutrin for augmentation of Pristiq. She has had  A good response to it in the past. Potential side effects of medication and risks vs benefits of treatment vs non-treatment were explained and discussed. All questions were answered.   1. Bipolar I disorder, most recent episode depressed (HCC)  - ARIPiprazole (ABILIFY) 20 MG tablet; Take 1  tablet (20 mg total) by mouth daily.  Dispense: 90 tablet; Refill: 0 - desvenlafaxine (PRISTIQ) 100 MG 24 hr tablet; Take 1 tablet (100 mg total) by mouth  daily.  Dispense: 90 tablet; Refill: 3 - temazepam (RESTORIL) 30 MG capsule; Take 1 capsule (30 mg total) by mouth at bedtime as needed for sleep.  Dispense: 30 capsule; Refill: 2 - topiramate (TOPAMAX) 100 MG tablet; Take 1 tablet (100 mg total) by mouth daily.  Dispense: 90 tablet; Refill: 0 - Start buPROPion (WELLBUTRIN XL) 150 MG 24 hr tablet; Take 1 tablet (150 mg total) by mouth every morning.  Dispense: 90 tablet; Refill: 0  2. Chronic post-traumatic stress disorder (PTSD)  - ARIPiprazole (ABILIFY) 20 MG tablet; Take 1 tablet (20 mg total) by mouth daily.  Dispense: 90 tablet; Refill: 0 - desvenlafaxine (PRISTIQ) 100 MG 24 hr tablet; Take 1 tablet (100 mg total) by mouth daily.  Dispense: 90 tablet; Refill: 3 - topiramate (TOPAMAX) 100 MG tablet; Take 1 tablet (100 mg total) by mouth daily.  Dispense: 90 tablet; Refill: 0   Continue individual therapy. Follow up in 6 weeks.   [place] Crane, MD 10/14/2020, 11:39 AM

## 2020-10-18 ENCOUNTER — Other Ambulatory Visit: Payer: BC Managed Care – PPO

## 2020-10-18 DIAGNOSIS — Z20822 Contact with and (suspected) exposure to covid-19: Secondary | ICD-10-CM | POA: Diagnosis not present

## 2020-10-19 LAB — SARS-COV-2, NAA 2 DAY TAT

## 2020-10-19 LAB — NOVEL CORONAVIRUS, NAA: SARS-CoV-2, NAA: NOT DETECTED

## 2020-10-31 ENCOUNTER — Ambulatory Visit (INDEPENDENT_AMBULATORY_CARE_PROVIDER_SITE_OTHER): Payer: BC Managed Care – PPO | Admitting: Licensed Clinical Social Worker

## 2020-10-31 ENCOUNTER — Other Ambulatory Visit: Payer: Self-pay

## 2020-10-31 DIAGNOSIS — F3130 Bipolar disorder, current episode depressed, mild or moderate severity, unspecified: Secondary | ICD-10-CM

## 2020-10-31 DIAGNOSIS — F4312 Post-traumatic stress disorder, chronic: Secondary | ICD-10-CM | POA: Diagnosis not present

## 2020-10-31 NOTE — Progress Notes (Addendum)
Virtual Visit via Video Note  I connected with Heather Jefferson on 10/31/20 at 11:00 AM EST by a video enabled telemedicine application and verified that I am speaking with the correct person using two identifiers.  Location: Patient: home Provider: remote office [place], [place])   I discussed the limitations of evaluation and management by telemedicine and the availability of in person appointments. The patient expressed understanding and agreed to proceed.   I discussed the assessment and treatment plan with the patient. The patient was provided an opportunity to ask questions and all were answered. The patient agreed with the plan and demonstrated an understanding of the instructions.   The patient was advised to call back or seek an in-person evaluation if the symptoms worsen or if the condition fails to improve as anticipated.  I provided 45 minutes of non-face-to-face time during this encounter.   Heather Jefferson R Heather Hupfer, LCSW    THERAPIST PROGRESS NOTE  Session Time: 11-11:45a  Participation Level: Active  Behavioral Response: Neat and Well GroomedAlertDepressed  Type of Therapy: Individual Therapy  Treatment Goals addressed: Trauma processing  Interventions: Supportive and Other: trauma focused  Summary: Heather Jefferson is a 50 y.o. female who presents with symptoms associated with bipolar disorder and PTSD. Pt reports that psychiatrist recently changed medication and that she is settling in after that. Pt reports fair quality and quantity of sleep.  Allowed pt to explore trauma from the past: pt engaged in an assignment that was given in past sessions "writing a letter to self" forgiving self. Pt read letter to clinician and admits that she put a lot of thought and effort into the assignment. Pt shared her thoughts and feelings as she was writing the letter and memories that were triggered. Discussed part 2: recording herself reading the letter to "child-self". The  next part of this assignment is listening as "child" to herself reading the letter intended to the child, and to identify feelings. Encouraged pt to do this in another session, but to record the video of herself at any time.   Pt reports that relationship with mother is uncomfortable at the moment. Pt was discussing some song lyrics with her mother which triggered some anger in her mother. Pts mother has not checked back in with pt and that was a few days ago. "I guess I'll just give her space"    Encouraged pt to focus on self care, positive social interaction, and life balance.   Suicidal/Homicidal: No  Therapist Response: Heather Jefferson is being intentional about exploring situations from the past that have triggered anxiety and stress from before. Pt is allowing herself to process through emotions that have been suppressed in the past. These are indicators of continuing progress. Treatment to continue as indicated.   Plan: Return again in 4 weeks.  Diagnosis: Axis I: Bipolar, Depressed and Post Traumatic Stress Disorder    Axis II: No diagnosis    [place], LCSW 10/31/2020
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
## 2020-11-17 ENCOUNTER — Other Ambulatory Visit: Payer: Self-pay

## 2020-11-17 ENCOUNTER — Encounter: Payer: Self-pay | Admitting: Family Medicine

## 2020-11-17 ENCOUNTER — Ambulatory Visit (INDEPENDENT_AMBULATORY_CARE_PROVIDER_SITE_OTHER): Payer: BC Managed Care – PPO | Admitting: Family Medicine

## 2020-11-17 VITALS — BP 138/86 | HR 82 | Temp 98.1°F | Resp 16 | Ht 62.0 in | Wt 288.8 lb

## 2020-11-17 DIAGNOSIS — E213 Hyperparathyroidism, unspecified: Secondary | ICD-10-CM | POA: Diagnosis not present

## 2020-11-17 DIAGNOSIS — G43009 Migraine without aura, not intractable, without status migrainosus: Secondary | ICD-10-CM | POA: Diagnosis not present

## 2020-11-17 DIAGNOSIS — E039 Hypothyroidism, unspecified: Secondary | ICD-10-CM | POA: Diagnosis not present

## 2020-11-17 DIAGNOSIS — E785 Hyperlipidemia, unspecified: Secondary | ICD-10-CM

## 2020-11-17 DIAGNOSIS — F3160 Bipolar disorder, current episode mixed, unspecified: Secondary | ICD-10-CM

## 2020-11-17 DIAGNOSIS — M059 Rheumatoid arthritis with rheumatoid factor, unspecified: Secondary | ICD-10-CM

## 2020-11-17 DIAGNOSIS — M797 Fibromyalgia: Secondary | ICD-10-CM

## 2020-11-17 DIAGNOSIS — E559 Vitamin D deficiency, unspecified: Secondary | ICD-10-CM

## 2020-11-17 DIAGNOSIS — I10 Essential (primary) hypertension: Secondary | ICD-10-CM

## 2020-11-17 MED ORDER — PREGABALIN 150 MG PO CAPS
150.0000 mg | ORAL_CAPSULE | Freq: Every evening | ORAL | 0 refills | Status: DC
Start: 2020-11-17 — End: 2021-02-15

## 2020-11-17 MED ORDER — VITAMIN D (ERGOCALCIFEROL) 1.25 MG (50000 UNIT) PO CAPS: 50000.0000 [IU] | ORAL_CAPSULE | ORAL | 1 refills | Status: DC

## 2020-11-18 DIAGNOSIS — M797 Fibromyalgia: Secondary | ICD-10-CM | POA: Diagnosis not present

## 2020-11-18 DIAGNOSIS — Z79899 Other long term (current) drug therapy: Secondary | ICD-10-CM | POA: Diagnosis not present

## 2020-11-18 DIAGNOSIS — M059 Rheumatoid arthritis with rheumatoid factor, unspecified: Secondary | ICD-10-CM | POA: Diagnosis not present

## 2020-11-18 LAB — TSH: TSH: 3.12 mIU/L

## 2020-11-25 ENCOUNTER — Encounter (HOSPITAL_COMMUNITY): Payer: Self-pay | Admitting: Psychiatry

## 2020-11-25 ENCOUNTER — Telehealth (INDEPENDENT_AMBULATORY_CARE_PROVIDER_SITE_OTHER): Payer: BC Managed Care – PPO | Admitting: Psychiatry

## 2020-11-25 ENCOUNTER — Other Ambulatory Visit: Payer: Self-pay

## 2020-11-25 DIAGNOSIS — F3130 Bipolar disorder, current episode depressed, mild or moderate severity, unspecified: Secondary | ICD-10-CM | POA: Diagnosis not present

## 2020-11-25 DIAGNOSIS — F4312 Post-traumatic stress disorder, chronic: Secondary | ICD-10-CM

## 2020-11-25 MED ORDER — ARIPIPRAZOLE 20 MG PO TABS: 20.0000 mg | ORAL_TABLET | Freq: Every day | ORAL | 0 refills | Status: DC

## 2020-11-25 MED ORDER — TOPIRAMATE 100 MG PO TABS: 100.0000 mg | ORAL_TABLET | Freq: Every day | ORAL | 0 refills | Status: DC

## 2020-11-25 MED ORDER — TEMAZEPAM 30 MG PO CAPS: 30.0000 mg | ORAL_CAPSULE | Freq: Every evening | ORAL | 2 refills | Status: DC | PRN

## 2020-11-25 MED ORDER — BUPROPION HCL ER (XL) 150 MG PO TB24: 150.0000 mg | ORAL_TABLET | ORAL | 0 refills | Status: DC

## 2020-11-25 MED ORDER — DESVENLAFAXINE SUCCINATE ER 100 MG PO TB24: 100.0000 mg | ORAL_TABLET | Freq: Every day | ORAL | 3 refills | Status: DC

## 2020-11-25 NOTE — Progress Notes (Signed)
[facility] OP Progress Note  Virtual Visit via Telephone Note  I connected with Heather Jefferson on 11/25/20 at 10:40 AM EST by telephone and verified that I am speaking with the correct person using two identifiers.  Location: Patient: home Provider: Clinic   I discussed the limitations, risks, security and privacy concerns of performing an evaluation and management service by telephone and the availability of in person appointments. I also discussed with the patient that there may be a patient responsible charge related to this service. The patient expressed understanding and agreed to proceed.   I provided 14 minutes of non-face-to-face time during this encounter.     11/25/2020 10:13 AM Heather Jefferson  MRN:  [id number]  Chief Complaint:  " I am feeling better now. Wellbutrin is helpful."  HPI: Patient reported that she has noticed her depression symptoms have improved ever since Wellbutrin was added to her regimen.  She stated that she feels more energetic now.  She is able to get more work done around the house.  She feels the current regimen is very helpful and she is feeling much better now. She has been talking to the therapist regularly and wants to continue to do so because that is very beneficial to her. She denied any other concerns or issues today. Writer advised her that we can continue Wellbutrin for now and then maybe consider taking it away during summertime and see how she does without it then.  Patient was agreeable to this recommendation.    Visit Diagnosis: Bipolar I disorder, most recent episode depressed ([place])  Chronic post-traumatic stress disorder (PTSD)   Past Psychiatric History: Bipolar I disorder, most recent episode depressed ([place])  Chronic post-traumatic stress disorder (PTSD)   Past Medical History:  Past Medical History:  Diagnosis Date  . ADHD (attention deficit hyperactivity disorder)   . Allergy   . Anemia   . Anxiety   . Depression    . Diabetes mellitus, type II ([place])   . Fibromyalgia   . Generalized anxiety disorder   . Headache   . HIV infection ([place])   . Hypertension   . Hypothyroidism   . Major depressive disorder, recurrent episode, moderate ([place])   . Migraine with acute onset aura   . Persistent disorder of initiating or maintaining sleep   . PTSD (post-traumatic stress disorder)   . Restless leg   . Seizure disorder ([place])   . Thyroid disease   . Vitamin D deficiency     Past Surgical History:  Procedure Laterality Date  . COLONOSCOPY WITH PROPOFOL N/A 01/29/2020   Procedure: COLONOSCOPY WITH PROPOFOL;  Surgeon: Jonathon Bellows, MD;  Location: [facility] ENDOSCOPY;  Service: Gastroenterology;  Laterality: N/A;  . EYE SURGERY    . TUBAL LIGATION      Family Psychiatric History: depression, anxiety in siblings  Family History:  Family History  Problem Relation Age of Onset  . Hypertension Mother   . Heart attack Mother   . CAD Mother   . Diabetes Father   . Hypertension Father   . Hypertension Sister   . Anxiety disorder Brother   . Depression Brother     Social History:  Social History   Socioeconomic History  . Marital status: Married    Spouse name: roberto  . Number of children: 2  . Years of education: Not on file  . Highest education level: 10th grade  Occupational History  . Occupation: disabled  Tobacco Use  . Smoking status: Never  Smoker  . Smokeless tobacco: Never Used  Vaping Use  . Vaping Use: Never used  Substance and Sexual Activity  . Alcohol use: Yes    Alcohol/week: 0.0 standard drinks    Comment: none last 24 hrs  . Drug use: No  . Sexual activity: Not Currently    Partners: Male    Birth control/protection: Other-see comments, I.U.D.    Comment: husband has ED  Other Topics Concern  . 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 Difficulty of Paying Living Expenses: Not hard at all  Food Insecurity: No Food Insecurity  . Worried About Charity fundraiser in the Last Year: Never true  . Ran Out of Food in the Last Year: Never true  Transportation Needs: No Transportation Needs  . Lack of Transportation (Medical): No  . Lack of Transportation (Non-Medical): No  Physical Activity: Inactive  . Days of Exercise per Week: 0 days  . Minutes of Exercise per Session: 0 min  Stress: Stress Concern Present  . Feeling of Stress : Rather much  Social Connections: Socially Integrated  . Frequency of Communication with Friends and Family: More than three times a week  . Frequency of Social Gatherings with Friends and Family: Once a week  . Attends Religious Services: More than 4 times per year  . Active Member of Clubs or Organizations: Yes  . Attends Archivist Meetings: More than 4 times per year  . Marital Status: Married    Allergies:  Allergies  Allergen Reactions  . Amoxicillin-Pot Clavulanate Hives  . Losartan Cough  . Metformin And Related Diarrhea and Nausea And Vomiting    Metabolic Disorder Labs: Lab Results  Component Value Date   HGBA1C 5.8 (H) 05/10/2020   MPG 120 05/10/2020   MPG 103 05/04/2019   No results found for: PROLACTIN Lab Results  Component Value Date   CHOL 212 (H) 05/10/2020   TRIG 231 (H) 05/10/2020   HDL 53 05/10/2020   CHOLHDL 4.0 05/10/2020   VLDL 38 (H) 10/18/2016   LDLCALC 124 (H) 05/10/2020   LDLCALC 125 (H) 05/04/2019   Lab Results  Component Value Date   TSH 3.12 11/17/2020   TSH 5.06 (H) 09/08/2020    Therapeutic Level Labs: No results found for: LITHIUM No results found for: VALPROATE No components found for:  CBMZ  Current Medications: Current Outpatient Medications  Medication Sig Dispense Refill  . amLODipine (NORVASC) 5 MG tablet Take 5 mg by mouth daily.    . ARIPiprazole (ABILIFY) 20 MG tablet  Take 1 tablet (20 mg total) by mouth daily. 90 tablet 0  . buPROPion (WELLBUTRIN XL) 150 MG 24 hr tablet Take 1 tablet (150 mg total) by mouth every morning. 90 tablet 0  . desvenlafaxine (PRISTIQ) 100 MG 24 hr tablet Take 1 tablet (100 mg total) by mouth daily. 90 tablet 3  . hydroxychloroquine (PLAQUENIL) 200 MG tablet Take 200 mg by mouth 2 (two) times daily.    Marland Kitchen levothyroxine (SYNTHROID) 25 MCG tablet TAKE 1 TABLET DAILY AND 2 TABLETS (50 MCG)  ON SUNDAYS 102 tablet 3  . MIRENA, 52 MG, 20 MCG/24HR IUD     . prednisoLONE acetate (PRED FORTE) 1 % ophthalmic suspension Place 1 drop into the right eye daily.     Marland Kitchen  pregabalin (LYRICA) 150 MG capsule Take 1 capsule (150 mg total) by mouth at bedtime. 90 capsule 0  . SUMAtriptan (IMITREX) 20 MG/ACT nasal spray Place 1 spray (20 mg total) into the nose every 2 (two) hours as needed for migraine. 3 each 2  . temazepam (RESTORIL) 30 MG capsule Take 1 capsule (30 mg total) by mouth at bedtime as needed for sleep. 30 capsule 2  . topiramate (TOPAMAX) 100 MG tablet Take 1 tablet (100 mg total) by mouth daily. 90 tablet 0  . Vitamin D, Ergocalciferol, (DRISDOL) 1.25 MG (50000 UNIT) CAPS capsule Take 1 capsule (50,000 Units total) by mouth every 7 (seven) days. 12 capsule 1   No current facility-administered medications for this visit.     Psychiatric Specialty Exam: ROS  There were no vitals taken for this visit.There is no height or weight on file to calculate BMI.  General Appearance: Fairly Groomed  Eye Contact:  Good  Speech:  Clear and Coherent  Volume:  Normal  Mood:  Euthymic  Affect:  Congruent  Thought Process:  Goal Directed, Linear and Descriptions of Associations: Intact  Orientation:  Full (Time, Place, and Person)  Thought Content: Logical   Suicidal Thoughts:  No  Homicidal Thoughts:  No  Memory:  Recent;   Good Remote;   Good  Judgement:  Fair  Insight:  Fair  Psychomotor Activity:  Normal  Concentration:  Concentration:  Good and Attention Span: Good  Recall:  Good  Fund of Knowledge: Good  Language: Good  Akathisia:  No  Handed:  Right  AIMS (if indicated): not done  Assets:  Communication Skills Desire for Improvement Financial Resources/Insurance Housing Social Support Transportation  ADL's:  Intact  Cognition: WNL  Sleep:  Good   Screenings: GAD-7   Health and safety inspector from 10/31/2020 in [place] Office Visit from 05/10/2020 in [facility] Office Visit from 08/04/2019 in [facility] Office Visit from 07/08/2018 in [facility] Office Visit from 05/07/2018 in [facility]  Total GAD-7 Score 10 18 16 16 6     PHQ2-9   [place] Office Visit from 11/17/2020 in [facility] Counselor from 10/31/2020 in [place] Office Visit from 08/12/2020 in [facility] Office Visit from 05/10/2020 in [facility] Office Visit from 02/04/2020 in [facility]  PHQ-2 Total Score 4 5 4 5 6   PHQ-9 Total Score 18 19 15 18 19     Flowsheet Row Counselor from 10/31/2020 in [place] No Risk       Assessment and Plan: Pt appears to be doing well at present.  1. Bipolar I disorder, most recent episode depressed ([facility])  - ARIPiprazole (ABILIFY) 20 MG tablet; Take 1 tablet (20 mg total) by mouth daily.  Dispense: 90 tablet; Refill: 0 - desvenlafaxine (PRISTIQ) 100 MG 24 hr tablet; Take 1 tablet (100 mg total) by mouth daily.  Dispense: 90 tablet; Refill: 3 - temazepam (RESTORIL) 30 MG capsule; Take 1 capsule (30 mg total) by mouth at bedtime as needed for sleep.  Dispense: 30 capsule; Refill: 2 - topiramate (TOPAMAX) 100 MG tablet; Take 1 tablet (100 mg total) by mouth daily.  Dispense: 90 tablet; Refill: 0 -  buPROPion (WELLBUTRIN XL) 150 MG 24 hr tablet;  Take 1 tablet (150 mg total) by mouth every morning.  Dispense: 90 tablet; Refill: 0  2. Chronic post-traumatic  stress disorder (PTSD)  - ARIPiprazole (ABILIFY) 20 MG tablet; Take 1 tablet (20 mg total) by mouth daily.  Dispense: 90 tablet; Refill: 0 - desvenlafaxine (PRISTIQ) 100 MG 24 hr tablet; Take 1 tablet (100 mg total) by mouth daily.  Dispense: 90 tablet; Refill: 3 - topiramate (TOPAMAX) 100 MG tablet; Take 1 tablet (100 mg total) by mouth daily.  Dispense: 90 tablet; Refill: 0   Continue individual therapy with Ms. Kandice Moos. Continue same medication regimen. Follow up in 10 weeks.    [place] Crane, MD 11/25/2020, 10:13 AM

## 2020-11-28 ENCOUNTER — Other Ambulatory Visit: Payer: Self-pay

## 2020-11-28 ENCOUNTER — Ambulatory Visit (INDEPENDENT_AMBULATORY_CARE_PROVIDER_SITE_OTHER): Payer: BC Managed Care – PPO | Admitting: Licensed Clinical Social Worker

## 2020-11-28 DIAGNOSIS — F4312 Post-traumatic stress disorder, chronic: Secondary | ICD-10-CM

## 2020-11-28 NOTE — Progress Notes (Signed)
Virtual Visit via Video Note  I connected with Heather Jefferson on 11/28/20 at 11:00 AM EST by a video enabled telemedicine application and verified that I am speaking with the correct person using two identifiers.  Location: Patient: home Provider: remote office [place], [place])   I discussed the limitations of evaluation and management by telemedicine and the availability of in person appointments. The patient expressed understanding and agreed to proceed.   I discussed the assessment and treatment plan with the patient. The patient was provided an opportunity to ask questions and all were answered. The patient agreed with the plan and demonstrated an understanding of the instructions.   The patient was advised to call back or seek an in-person evaluation if the symptoms worsen or if the condition fails to improve as anticipated.  I provided 30 minutes of non-face-to-face time during this encounter.   Angelique Chevalier R Vearl Aitken, LCSW   THERAPIST PROGRESS NOTE  Session Time: 11-11:30a  Participation Level: Active  Behavioral Response: Neat and Well GroomedAlertAnxious and Depressed  Type of Therapy: Individual Therapy  Treatment Goals addressed: Coping  Interventions: CBT, DBT and Other: trauma focused  Summary: Heather Jefferson is a 50 y.o. female who presents with improving symptoms related to PTSD and bipolsr disorder diagnoses. Pt reports that her mood is much more stable and that she is managing stress and anxiety better.   Allowed pt to explore and express thoughts and feelings about trauma--pt discussed some triggers and how she is managing them, how new medication is working for her fibromyalgia, and how pt found journal writings from 20 years ago and has been reflecting back on them.    Pt started new fibromyalgia medication and feels that its improving overall sleep quality. Pt reports that she is waking once during the night but able to fall back to sleep within 10-15  min.   Explored the journal writings and allowed pt to compare/contrast where she was emotionally 20 years ago to her present mindset. Pt can identify progress within herself and takes pride in that.   Continued recommendations are as follows: self care behaviors, positive social engagements, focusing on overall work/home/life balance, and focusing on positive physical and emotional wellness.   Suicidal/Homicidal: No  Therapist Response: Denetria is able to articulate interventions that she is trying to cope with anxiety and stress related to trauma. Pt is able to discuss and recall traumatic event in details without being overcome with negative emotions. These behaviors are signs of personal growth and progress. Treatment to continue.  Plan: Return again in 4 weeks.  Diagnosis: Axis I: Post Traumatic Stress Disorder    Axis II: No diagnosis    [place], LCSW 11/28/2020

## 2020-11-30 DIAGNOSIS — R600 Localized edema: Secondary | ICD-10-CM | POA: Diagnosis not present

## 2020-11-30 DIAGNOSIS — I10 Essential (primary) hypertension: Secondary | ICD-10-CM | POA: Diagnosis not present

## 2020-12-02 NOTE — Progress Notes (Deleted)
Patient: Heather Jefferson, Female    DOB: Jun 15, 1971, 50 y.o.   MRN: [id number]  Visit Date: 12/02/2020  Today's Provider: [facility] ADVISOR   No chief complaint on file.   Subjective:    HPI Heather Jefferson is a 50 y.o. female who presents today for her Subsequent Annual Wellness Visit.  Patient/Caregiver input:    Review of Systems *** Constitutional: Negative for fever or weight change.  Respiratory: Negative for cough and shortness of breath.   Cardiovascular: Negative for chest pain or palpitations.  Gastrointestinal: Negative for abdominal pain, no bowel changes.  Musculoskeletal: Negative for gait problem or joint swelling.  Skin: Negative for rash.  Neurological: Negative for dizziness or headache.  No other specific complaints in a complete review of systems (except as listed in HPI above).  Past Medical History:  Diagnosis Date  . ADHD (attention deficit hyperactivity disorder)   . Allergy   . Anemia   . Anxiety   . Depression   . Diabetes mellitus, type II ([place])   . Fibromyalgia   . Generalized anxiety disorder   . Headache   . HIV infection ([place])   . Hypertension   . Hypothyroidism   . Major depressive disorder, recurrent episode, moderate ([place])   . Migraine with acute onset aura   . Persistent disorder of initiating or maintaining sleep   . PTSD (post-traumatic stress disorder)   . Restless leg   . Seizure disorder ([place])   . Thyroid disease   . Vitamin D deficiency     Past Surgical History:  Procedure Laterality Date  . COLONOSCOPY WITH PROPOFOL N/A 01/29/2020   Procedure: COLONOSCOPY WITH PROPOFOL;  Surgeon: Jonathon Bellows, MD;  Location: [facility] ENDOSCOPY;  Service: Gastroenterology;  Laterality: N/A;  . EYE SURGERY    . TUBAL LIGATION      Family History  Problem Relation Age of Onset  . Hypertension Mother   . Heart attack Mother   . CAD Mother   . Diabetes Father   . Hypertension Father   . Hypertension Sister   . Anxiety  disorder Brother   . Depression Brother     Social History   Socioeconomic History  . Marital status: Married    Spouse name: roberto  . Number of children: 2  . Years of education: Not on file  . Highest education level: 10th grade  Occupational History  . Occupation: disabled  Tobacco Use  . Smoking status: Never Smoker  . Smokeless tobacco: Never Used  Vaping Use  . Vaping Use: Never used  Substance and Sexual Activity  . Alcohol use: Yes    Alcohol/week: 0.0 standard drinks    Comment: none last 24 hrs  . Drug use: No  . Sexual activity: Not Currently    Partners: Male    Birth control/protection: Other-see comments, I.U.D.    Comment: husband has ED  Other Topics Concern  . 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 Difficulty of Paying Living Expenses: Not hard at all  Food Insecurity: No Food Insecurity  . Worried About Charity fundraiser in the Last Year: Never true  . Ran Out of Food in the Last Year: Never true  Transportation Needs: No Transportation Needs  . Lack of Transportation (Medical): No  .  Lack of Transportation (Non-Medical): No  Physical Activity: Inactive  . Days of Exercise per Week: 0 days  . Minutes of Exercise per Session: 0 min  Stress: Stress Concern Present  . Feeling of Stress : Rather much  Social Connections: Socially Integrated  . Frequency of Communication with Friends and Family: More than three times a week  . Frequency of Social Gatherings with Friends and Family: Once a week  . Attends Religious Services: More than 4 times per year  . Active Member of Clubs or Organizations: Yes  . Attends Archivist Meetings: More than 4 times per year  . Marital Status: Married  Human resources officer Violence: Not At Risk  . Fear of Current or Ex-Partner:  No  . Emotionally Abused: No  . Physically Abused: No  . Sexually Abused: No    Outpatient Encounter Medications as of 12/06/2020  Medication Sig Note  . amLODipine (NORVASC) 5 MG tablet Take 5 mg by mouth daily.   . ARIPiprazole (ABILIFY) 20 MG tablet Take 1 tablet (20 mg total) by mouth daily.   Marland Kitchen buPROPion (WELLBUTRIN XL) 150 MG 24 hr tablet Take 1 tablet (150 mg total) by mouth every morning.   . desvenlafaxine (PRISTIQ) 100 MG 24 hr tablet Take 1 tablet (100 mg total) by mouth daily.   . hydroxychloroquine (PLAQUENIL) 200 MG tablet Take 200 mg by mouth 2 (two) times daily.   Marland Kitchen levothyroxine (SYNTHROID) 25 MCG tablet TAKE 1 TABLET DAILY AND 2 TABLETS (50 MCG)  ON SUNDAYS   . MIRENA, 52 MG, 20 MCG/24HR IUD  06/11/2017: [facility]   . prednisoLONE acetate (PRED FORTE) 1 % ophthalmic suspension Place 1 drop into the right eye daily.    . pregabalin (LYRICA) 150 MG capsule Take 1 capsule (150 mg total) by mouth at bedtime.   . SUMAtriptan (IMITREX) 20 MG/ACT nasal spray Place 1 spray (20 mg total) into the nose every 2 (two) hours as needed for migraine.   . temazepam (RESTORIL) 30 MG capsule Take 1 capsule (30 mg total) by mouth at bedtime as needed for sleep.   Marland Kitchen topiramate (TOPAMAX) 100 MG tablet Take 1 tablet (100 mg total) by mouth daily.   . Vitamin D, Ergocalciferol, (DRISDOL) 1.25 MG (50000 UNIT) CAPS capsule Take 1 capsule (50,000 Units total) by mouth every 7 (seven) days.    No facility-administered encounter medications on file as of 12/06/2020.    Allergies  Allergen Reactions  . Amoxicillin-Pot Clavulanate Hives  . Losartan Cough  . Metformin And Related Diarrhea and Nausea And Vomiting    Care Team Updated in EHR: {Yes/No:30480221}  Last Vision Exam: *** Wears corrective lenses: {Yes/No:30480221} Last Dental Exam: *** Last Hearing Exam: *** Wears Hearing Aids: {Yes/No:30480221}  Functional Ability / Safety Screening 1.  Was the timed Get Up and Go test  shorter than 30 seconds?  {Blank single:19197::"yes","no"} 2.  Does the patient need help with the phone, transportation, shopping,      preparing meals, housework, laundry, medications, or managing money?  {Blank single:19197::"yes","no"} 3.  Is the patient's home free of loose throw rugs in walkways, pet beds, electrical cords, etc?   {Blank single:19197::"yes","no"}      Grab bars in the bathroom? {Blank single:19197::"yes","no"}      Handrails on the stairs?   {Blank single:19197::"yes","no"}      Adequate lighting?   {Blank single:19197::"yes","no"} 4.  Has the patient noticed any hearing difficulties?   {Blank single:19197::"yes","no"}  Diet Recall and Exercise  Regimen: ***  Advanced Care Planning: A voluntary discussion about advance care planning including the explanation and discussion of advance directives.  Discussed health care proxy and Living will, and the patient was able to identify a health care proxy as ***.  Patient {DOES_DOES OVF:64332} have a living will at present time. If patient does have living will, I have requested they bring this to the clinic to be scanned in to their chart. Does patient have a HCPOA?    {Blank single:19197::"yes","no"} If yes, name and contact information:  Does patient have a living will or MOST form?  {Blank single:19197::"yes","no"}  Cancer Screenings: Skin: *** Lung: *** Low Dose CT Chest recommended if Age 62-80 years, 30 pack-year currently smoking OR have quit w/in 15years. Patient {DOES NOT does:27190::"does not"} qualify. Breast: *** Up to date on Mammogram? {Yes/No:30480221}  Up to date of Bone Density/Dexa? {Yes/No:30480221} Colon: ***  Additional Screenings: *** Hepatitis B/HIV/Syphillis: Hepatitis C Screening:  Intimate Partner Violence:   Objective:   Vitals: There were no vitals taken for this visit. There is no height or weight on file to calculate BMI.  No exam data present  Physical Exam Constitutional: Patient  appears well-developed and well-nourished. Obese *** No distress.  HEENT: head atraumatic, normocephalic, pupils equal and reactive to light, ears ***, neck supple, throat within normal limits Cardiovascular: Normal rate, regular rhythm and normal heart sounds.  No murmur heard. No BLE edema. Pulmonary/Chest: Effort normal and breath sounds normal. No respiratory distress. Abdominal: Soft.  There is no tenderness. Psychiatric: Patient has a normal mood and affect. behavior is normal. Judgment and thought content normal.  Cognitive Testing - 6-CIT  Correct? Score   What year is it? {YES NO:22349} {Numbers; 0-4:31231} Yes = 0    No = 4  What month is it? {YES NO:22349} {Numbers; 0-4:31231} Yes = 0    No = 3  Remember:     Pia Mau, [place], [place]     What time is it? {YES NO:22349} {Numbers; 0-4:31231} Yes = 0    No = 3  Count backwards from 20 to 1 {YES NO:22349} {Numbers; 0-4:31231} Correct = 0    1 error = 2   More than 1 error = 4  Say the months of the year in reverse. {YES NO:22349} {Numbers; 0-4:31231} Correct = 0    1 error = 2   More than 1 error = 4  What address did I ask you to remember? {YES NO:22349} {NUMBERS; 0-10:5044} Correct = 0  1 error = 2    2 error = 4    3 error = 6    4 error = 8    All wrong = 10       TOTAL SCORE  {Numbers; 9-51:88416}/60   Interpretation:  {Desc; normal/abnormal:11317::"Normal"}  Normal (0-7) Abnormal (8-28)   Fall Risk: Fall Risk  11/17/2020 08/12/2020 05/10/2020 02/04/2020 01/19/2020  Falls in the past year? 0 0 0 0 1  Number falls in past yr: 0 0 0 0 1  Injury with Fall? 0 0 0 0 1  Comment - - - - -  Risk for fall due to : - - - - -  Follow up - - - Falls evaluation completed -    Depression Screen Depression screen [facility] 2/9 11/17/2020 08/12/2020 05/10/2020 02/04/2020 01/19/2020  Decreased Interest 2 2 2 3 1   Down, Depressed, Hopeless 2 2 3 3 2   PHQ - 2 Score 4 4 5  6  3  Altered sleeping 2 3 3 3 3   Tired, decreased energy 3 3 3 3 3    Change in appetite 3 2 3 3 3   Feeling bad or failure about yourself  3 2 3 3 3   Trouble concentrating 3 1 1 1 2   Moving slowly or fidgety/restless 0 0 0 0 0  Suicidal thoughts 0 0 0 0 0  PHQ-9 Score 18 15 18 19 17   Difficult doing work/chores - Somewhat difficult - Somewhat difficult Somewhat difficult  Some encounter information is confidential and restricted. Go to Review Flowsheets activity to see all data.  Some recent data might be hidden    Recent Results (from the past 2160 hour(s))  TSH     Status: Abnormal   Collection Time: 09/08/20  9:38 AM  Result Value Ref Range   TSH 5.06 (H) mIU/L    Comment:           Reference Range .           > or = 20 Years  0.40-4.50 .                Pregnancy Ranges           First trimester    0.26-2.66           Second trimester   0.55-2.73           Third trimester    0.43-2.91   COMPLETE METABOLIC PANEL WITH GFR     Status: None   Collection Time: 09/08/20  9:38 AM  Result Value Ref Range   Glucose, Bld 95 65 - 99 mg/dL    Comment: .            Fasting reference interval .    BUN 9 7 - 25 mg/dL   Creat 0.73 0.50 - 1.05 mg/dL    Comment: For patients >60 years of age, the reference limit for Creatinine is approximately 13% higher for people identified as African-American. .    GFR, Est Non African American 96 > OR = 60 mL/min/1.19m2   GFR, Est African American 111 > OR = 60 mL/min/1.38m2   BUN/Creatinine Ratio NOT APPLICABLE 6 - 22 (calc)   Sodium 139 135 - 146 mmol/L   Potassium 4.1 3.5 - 5.3 mmol/L   Chloride 104 98 - 110 mmol/L   CO2 25 20 - 32 mmol/L   Calcium 9.6 8.6 - 10.4 mg/dL   Total Protein 7.3 6.1 - 8.1 g/dL   Albumin 4.5 3.6 - 5.1 g/dL   Globulin 2.8 1.9 - 3.7 g/dL (calc)   AG Ratio 1.6 1.0 - 2.5 (calc)   Total Bilirubin 0.7 0.2 - 1.2 mg/dL   Alkaline phosphatase (APISO) 130 37 - 153 U/L   AST 17 10 - 35 U/L   ALT 17 6 - 29 U/L  Parathyroid hormone, intact (no Ca)     Status: Abnormal   Collection Time:  09/08/20  9:38 AM  Result Value Ref Range   PTH 113 (H) 14 - 64 pg/mL    Comment: . Interpretive Guide    Intact PTH           Calcium ------------------    ----------           ------- Normal Parathyroid    Normal               Normal Hypoparathyroidism    Low or Low Normal    Low Hyperparathyroidism  Primary            Normal or High       High    Secondary          High                 Normal or Low    Tertiary           High                 High Non-Parathyroid    Hypercalcemia      Low or Low Normal    High .   Novel Coronavirus, NAA ([facility])     Status: None   Collection Time: 10/18/20  6:05 PM   Specimen: Nasopharyngeal(NP) swabs in vial transport medium   Nasopharynge  Screenin  Result Value Ref Range   SARS-CoV-2, NAA Not Detected Not Detected    Comment: This nucleic acid amplification test was developed and its performance characteristics determined by [employer]. Nucleic acid amplification tests include RT-PCR and TMA. This test has not been FDA cleared or approved. This test has been authorized by FDA under an Emergency Use Authorization (EUA). This test is only authorized for the duration of time the declaration that circumstances exist justifying the authorization of the emergency use of in vitro diagnostic tests for detection of SARS-CoV-2 virus and/or diagnosis of COVID-19 infection under section 564(b)(1) of the Act, 21 U.S.C. 400QQP-6(P) (1), unless the authorization is terminated or revoked sooner. When diagnostic testing is negative, the possibility of a false negative result should be considered in the context of a patient's recent exposures and the presence of clinical signs and symptoms consistent with COVID-19. An individual without symptoms of COVID-19 and who is not shedding SARS-CoV-2 virus wo uld expect to have a negative (not detected) result in this assay.   SARS-COV-2, NAA 2 DAY TAT     Status: None   Collection Time: 10/18/20   6:05 PM   Nasopharynge  Screenin  Result Value Ref Range   SARS-CoV-2, NAA 2 DAY TAT Performed   TSH     Status: None   Collection Time: 11/17/20  9:33 AM  Result Value Ref Range   TSH 3.12 mIU/L    Comment:           Reference Range .           > or = 20 Years  0.40-4.50 .                Pregnancy Ranges           First trimester    0.26-2.66           Second trimester   0.55-2.73           Third trimester    0.43-2.91     Assessment & Plan:    1. Encounter for screening mammogram for malignant neoplasm of breast ***  *** type dotphrase "dot"diagmed to refresh this list  Exercise Activities and Dietary recommendations Goals    . Weight (lb) < 225 lb (102.1 kg)     Pt states she would like to lose 25 lbs over the next year with healthy eating and exercise      - Discussed health benefits of physical activity, and encouraged her to engage in regular exercise appropriate for her age and condition.   Immunization History  Administered Date(s) Administered  . Influenza,inj,Quad PF,6+ Mos 05/17/2015, 10/16/2017, 10/08/2018, 08/04/2019, 08/12/2020  . Influenza-Unspecified  07/14/2014  . PFIZER(Purple Top)SARS-COV-2 Vaccination 12/10/2019, 12/31/2019  . Pneumococcal Polysaccharide-23 05/25/2013  . Tdap 12/30/2009, 09/30/2018    Health Maintenance  Topic Date Due  . MAMMOGRAM  10/09/2019  . COVID-19 Vaccine (3 - Booster for Pfizer series) 07/01/2020  . PAP SMEAR-Modifier  07/09/2023  . COLONOSCOPY (Pts 45-25yrs Insurance coverage will need to be confirmed)  01/28/2025  . TETANUS/TDAP  09/30/2028  . INFLUENZA VACCINE  Completed  . Hepatitis C Screening  Completed  . HIV Screening  Completed  . HPV VACCINES  Aged Out    No orders of the defined types were placed in this encounter.   Current Outpatient Medications:  .  amLODipine (NORVASC) 5 MG tablet, Take 5 mg by mouth daily., Disp: , Rfl:  .  ARIPiprazole (ABILIFY) 20 MG tablet, Take 1 tablet (20 mg total) by mouth  daily., Disp: 90 tablet, Rfl: 0 .  buPROPion (WELLBUTRIN XL) 150 MG 24 hr tablet, Take 1 tablet (150 mg total) by mouth every morning., Disp: 90 tablet, Rfl: 0 .  desvenlafaxine (PRISTIQ) 100 MG 24 hr tablet, Take 1 tablet (100 mg total) by mouth daily., Disp: 90 tablet, Rfl: 3 .  hydroxychloroquine (PLAQUENIL) 200 MG tablet, Take 200 mg by mouth 2 (two) times daily., Disp: , Rfl:  .  levothyroxine (SYNTHROID) 25 MCG tablet, TAKE 1 TABLET DAILY AND 2 TABLETS (50 MCG)  ON SUNDAYS, Disp: 102 tablet, Rfl: 3 .  MIRENA, 52 MG, 20 MCG/24HR IUD, , Disp: , Rfl:  .  prednisoLONE acetate (PRED FORTE) 1 % ophthalmic suspension, Place 1 drop into the right eye daily. , Disp: , Rfl:  .  pregabalin (LYRICA) 150 MG capsule, Take 1 capsule (150 mg total) by mouth at bedtime., Disp: 90 capsule, Rfl: 0 .  SUMAtriptan (IMITREX) 20 MG/ACT nasal spray, Place 1 spray (20 mg total) into the nose every 2 (two) hours as needed for migraine., Disp: 3 each, Rfl: 2 .  temazepam (RESTORIL) 30 MG capsule, Take 1 capsule (30 mg total) by mouth at bedtime as needed for sleep., Disp: 30 capsule, Rfl: 2 .  topiramate (TOPAMAX) 100 MG tablet, Take 1 tablet (100 mg total) by mouth daily., Disp: 90 tablet, Rfl: 0 .  Vitamin D, Ergocalciferol, (DRISDOL) 1.25 MG (50000 UNIT) CAPS capsule, Take 1 capsule (50,000 Units total) by mouth every 7 (seven) days., Disp: 12 capsule, Rfl: 1 There are no discontinued medications.  I have personally reviewed and addressed the Medicare Annual Wellness health risk assessment questionnaire and have noted the following in the patient's chart:  A.         Medical and social history & family history B.         Use of alcohol, tobacco, and illicit drugs  C.         Current medications and supplements D.         Functional and Cognitive ability and status E.         Nutritional status F.         Physical activity G.        Advance directives H.         List of other physicians I.           Hospitalizations, surgeries, and ER visits in previous 12 months J.         [place] such as hearing, vision, cognitive function, and depression L.  Referrals and appointments: ***  In addition, I have reviewed and discussed with patient certain preventive protocols, quality metrics, and best practice recommendations. A written personalized care plan for preventive services as well as general preventive health recommendations were provided to patient.   See attached scanned questionnaire for additional information.   No follow-ups on file. *** refresh to show

## 2020-12-06 ENCOUNTER — Other Ambulatory Visit: Payer: Self-pay

## 2020-12-06 ENCOUNTER — Ambulatory Visit (INDEPENDENT_AMBULATORY_CARE_PROVIDER_SITE_OTHER): Payer: BC Managed Care – PPO

## 2020-12-06 VITALS — BP 118/72 | HR 77 | Temp 98.1°F | Resp 16 | Ht 62.0 in | Wt 287.9 lb

## 2020-12-06 DIAGNOSIS — Z1231 Encounter for screening mammogram for malignant neoplasm of breast: Secondary | ICD-10-CM | POA: Diagnosis not present

## 2020-12-06 DIAGNOSIS — Z0000 Encounter for general adult medical examination without abnormal findings: Secondary | ICD-10-CM

## 2020-12-06 NOTE — Progress Notes (Unsigned)
Subjective:   Heather Jefferson is a 50 y.o. female who presents for Medicare Annual (Subsequent) preventive examination.  Review of Systems     Cardiac Risk Factors include: advanced age (>82men, >43 women);hypertension;obesity (BMI >30kg/m2);sedentary lifestyle     Objective:    Today's Vitals   12/06/20 0826  BP: 118/72  Pulse: 77  Resp: 16  Temp: 98.1 F (36.7 C)  TempSrc: Oral  Weight: 287 lb 14.4 oz (130.6 kg)  Height: 5\' 2"  (1.575 m)  PainSc: 4    Body mass index is 52.66 kg/m.  Advanced Directives 12/06/2020 01/29/2020 12/03/2019 11/28/2018 10/16/2018 04/04/2018 08/26/2017  Does Patient Have a Medical Advance Directive? No No No No No Yes No  Does patient want to make changes to medical advance directive? - - - - - No - Patient declined -  Would patient like information on creating a medical advance directive? Yes (MAU/Ambulatory/Procedural Areas - Information given) No - Patient declined Yes (MAU/Ambulatory/Procedural Areas - Information given) No - Patient declined No - Patient declined - No - Patient declined  Some encounter information is confidential and restricted. Go to Review Flowsheets activity to see all data.    Current Medications (verified) Outpatient Encounter Medications as of 12/06/2020  Medication Sig  . amLODipine (NORVASC) 5 MG tablet Take 5 mg by mouth daily.  . ARIPiprazole (ABILIFY) 20 MG tablet Take 1 tablet (20 mg total) by mouth daily.  Marland Kitchen buPROPion (WELLBUTRIN XL) 150 MG 24 hr tablet Take 1 tablet (150 mg total) by mouth every morning.  . desvenlafaxine (PRISTIQ) 100 MG 24 hr tablet Take 1 tablet (100 mg total) by mouth daily.  . hydroxychloroquine (PLAQUENIL) 200 MG tablet Take 200 mg by mouth 2 (two) times daily.  Marland Kitchen levothyroxine (SYNTHROID) 25 MCG tablet TAKE 1 TABLET DAILY AND 2 TABLETS (50 MCG)  ON SUNDAYS  . MIRENA, 52 MG, 20 MCG/24HR IUD   . prednisoLONE acetate (PRED FORTE) 1 % ophthalmic suspension Place 1 drop into the right eye daily.    . pregabalin (LYRICA) 150 MG capsule Take 1 capsule (150 mg total) by mouth at bedtime.  . SUMAtriptan (IMITREX) 20 MG/ACT nasal spray Place 1 spray (20 mg total) into the nose every 2 (two) hours as needed for migraine.  . temazepam (RESTORIL) 30 MG capsule Take 1 capsule (30 mg total) by mouth at bedtime as needed for sleep.  Marland Kitchen topiramate (TOPAMAX) 100 MG tablet Take 1 tablet (100 mg total) by mouth daily.  Marland Kitchen torsemide (DEMADEX) 5 MG tablet Take 5 mg by mouth daily.  . Vitamin D, Ergocalciferol, (DRISDOL) 1.25 MG (50000 UNIT) CAPS capsule Take 1 capsule (50,000 Units total) by mouth every 7 (seven) days.   No facility-administered encounter medications on file as of 12/06/2020.    Allergies (verified) Amoxicillin-pot clavulanate, Losartan, and Metformin and related   History: Past Medical History:  Diagnosis Date  . ADHD (attention deficit hyperactivity disorder)   . Allergy   . Anemia   . Anxiety   . Depression   . Diabetes mellitus, type II ([place])   . Fibromyalgia   . Generalized anxiety disorder   . Headache   . HIV infection ([place])   . Hypertension   . Hypothyroidism   . Major depressive disorder, recurrent episode, moderate ([place])   . Migraine with acute onset aura   . Persistent disorder of initiating or maintaining sleep   . PTSD (post-traumatic stress disorder)   . Restless leg   . Seizure disorder ([place])   .  Thyroid disease   . Vitamin D deficiency    Past Surgical History:  Procedure Laterality Date  . COLONOSCOPY WITH PROPOFOL N/A 01/29/2020   Procedure: COLONOSCOPY WITH PROPOFOL;  Surgeon: Jonathon Bellows, MD;  Location[facility] ENDOSCOPY;  Service: Gastroenterology;  Laterality: N/A;  . EYE SURGERY    . TUBAL LIGATION     Family History  Problem Relation Age of Onset  . Hypertension Mother   . Heart attack Mother   . CAD Mother   . Diabetes Father   . Hypertension Father   . Hypertension Sister   . Anxiety disorder Brother   . Depression Brother    Social  History   Socioeconomic History  . Marital status: Married    Spouse name: roberto  . Number of children: 2  . Years of education: Not on file  . Highest education level: 10th grade  Occupational History  . Occupation: disabled  Tobacco Use  . Smoking status: Never Smoker  . Smokeless tobacco: Never Used  Vaping Use  . Vaping Use: Never used  Substance and Sexual Activity  . Alcohol use: Yes    Alcohol/week: 0.0 standard drinks    Comment: none last 24 hrs  . Drug use: No  . Sexual activity: Not Currently    Partners: Male    Birth control/protection: Other-see comments, I.U.D.    Comment: husband has ED  Other Topics Concern  . 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 Difficulty of Paying Living Expenses: Not hard at all  Food Insecurity: No Food Insecurity  . Worried About Charity fundraiser in the Last Year: Never true  . Ran Out of Food in the Last Year: Never true  Transportation Needs: No Transportation Needs  . Lack of Transportation (Medical): No  . Lack of Transportation (Non-Medical): No  Physical Activity: Inactive  . Days of Exercise per Week: 0 days  . Minutes of Exercise per Session: 0 min  Stress: Stress Concern Present  . Feeling of Stress : Rather much  Social Connections: Moderately Integrated  . Frequency of Communication with Friends and Family: More than three times a week  . Frequency of Social Gatherings with Friends and Family: Once a week  . Attends Religious Services: More than 4 times per year  . Active Member of Clubs or Organizations: No  . Attends Archivist Meetings: Never  . Marital Status: Married    Tobacco Counseling Counseling given: Not Answered   Clinical Intake:  Pre-visit preparation completed: Yes  Pain : 0-10 Pain Score: 4   Pain Type: Chronic pain Pain Location: Hand (ankles) Pain Orientation: Right,Left Pain Descriptors / Indicators: Aching,Sore Pain Onset: More than a month ago Pain Frequency: Constant     BMI - recorded: 52.66 Nutritional Status: BMI > 30  Obese Nutritional Risks: None Diabetes: No  How often do you need to have someone help you when you read instructions, pamphlets, or other written materials from your doctor or pharmacy?: 1 - Never    Interpreter Needed?: No  Information entered by :: Clemetine Marker LPN   Activities of Daily Living In your present state of health, do you have any difficulty performing the following activities: 12/06/2020 11/17/2020  Hearing? N N  Comment declines hearing aids -  Vision?  N N  Difficulty concentrating or making decisions? Y Y  Comment - -  Walking or climbing stairs? Y Y  Comment - -  Dressing or bathing? Y Y  Doing errands, shopping? N Y  Comment - -  Conservation officer, nature and eating ? N -  Using the Toilet? N -  In the past six months, have you accidently leaked urine? N -  Do you have problems with loss of bowel control? N -  Managing your Medications? N -  Managing your Finances? N -  Housekeeping or managing your Housekeeping? N -  Some recent data might be hidden    Patient Care Team: Steele Sizer, MD as PCP - General [place] Crane, MD as Consulting Physician (Psychiatry) Huss[place], Rachel Bo, LCSW as Social Worker (Licensed Clinical Social Worker) Posey Pronto, Ninfa Meeker, MD as Consulting Physician (Rheumatology) Murlean Iba, MD (Nephrology)  Indicate any recent Medical Services you may have received from other than [facility] providers in the past year (date may be approximate).     Assessment:   This is a routine wellness examination for Anissia.  Hearing/Vision screen  Hearing Screening   125Hz  250Hz  500Hz  1000Hz  2000Hz  3000Hz  4000Hz  6000Hz  8000Hz   Right ear:           Left ear:           Comments: Pt denies hearing difficulty    Vision Screening Comments: Annual vision screenings at [place] issues and exercise activities discussed: Current Exercise Habits: The patient does not participate in regular exercise at present, Exercise limited by: orthopedic condition(s)  Goals    . Weight (lb) < 225 lb (102.1 kg)     Pt states she would like to lose 25 lbs over the next year with healthy eating and exercise      Depression Screen PHQ 2/9 Scores 12/06/2020 11/17/2020 08/12/2020 05/10/2020 02/04/2020 01/19/2020 12/03/2019  PHQ - 2 Score 6 4 4 5 6 3 6   PHQ- 9 Score 22 18 15 18 19 17 24   Not completed - - - - - - -  Some encounter information is confidential and restricted. Go to Review Flowsheets activity to see all data.    Fall Risk Fall Risk  12/06/2020 11/17/2020 08/12/2020 05/10/2020 02/04/2020  Falls in the past year? 0 0 0 0 0  Number falls in past yr: 0 0 0 0 0  Injury with Fall? 0 0 0 0 0  Comment - - - - -  Risk for fall due to : No Fall Risks - - - -  Follow up Falls prevention discussed - - - Falls evaluation completed    FALL RISK PREVENTION PERTAINING TO THE HOME:  Any stairs in or around the home? Yes  If so, are there any without handrails? Yes   Outside steps Home free of loose throw rugs in walkways, pet beds, electrical cords, etc? Yes  Adequate lighting in your home to reduce risk of falls? Yes   ASSISTIVE DEVICES UTILIZED TO PREVENT FALLS:  Life alert? No  Use of a cane, walker or w/c? No  Grab bars in the bathroom? No  Shower chair or bench in shower? No  Elevated toilet seat or a handicapped toilet? Yes   TIMED UP AND GO:  Was the test performed? Yes .  Length of time to ambulate 10 feet: 5 sec.   Gait steady and fast without use of assistive device  Cognitive Function: Normal cognitive status assessed by direct observation by  this Nurse Health Advisor. No abnormalities found.       6CIT Screen 12/03/2019 11/28/2018  What Year? 0 points 0 points  What month? 0  points 0 points  What time? 0 points 0 points  Count back from 20 0 points 0 points  Months in reverse 0 points 0 points  Repeat phrase 4 points 0 points  Total Score 4 0    Immunizations Immunization History  Administered Date(s) Administered  . Influenza,inj,Quad PF,6+ Mos 05/17/2015, 10/16/2017, 10/08/2018, 08/04/2019, 08/12/2020  . Influenza-Unspecified 07/14/2014  . PFIZER(Purple Top)SARS-COV-2 Vaccination 12/10/2019, 12/31/2019  . Pneumococcal Polysaccharide-23 05/25/2013  . Tdap 12/30/2009, 09/30/2018    TDAP status: Up to date  Flu Vaccine status: Up to date  Pneumococcal vaccine status: Up to date  Covid-19 vaccine status: Completed vaccines  Qualifies for Shingles Vaccine? Yes   Zostavax completed No   Shingrix Completed?: No.    Education has been provided regarding the importance of this vaccine. Patient has been advised to call insurance company to determine out of pocket expense if they have not yet received this vaccine. Advised may also receive vaccine at local pharmacy or Health Dept. Verbalized acceptance and understanding.  Screening Tests Health Maintenance  Topic Date Due  . MAMMOGRAM  10/09/2019  . COVID-19 Vaccine (3 - Booster for Pfizer series) 07/01/2020  . PAP SMEAR-Modifier  07/09/2023  . COLONOSCOPY (Pts 45-46yrs Insurance coverage will need to be confirmed)  01/28/2025  . TETANUS/TDAP  09/30/2028  . INFLUENZA VACCINE  Completed  . Hepatitis C Screening  Completed  . HIV Screening  Completed  . HPV VACCINES  Aged Out    Health Maintenance  Health Maintenance Due  Topic Date Due  . MAMMOGRAM  10/09/2019  . COVID-19 Vaccine (3 - Booster for Pfizer series) 07/01/2020     Colorectal cancer screening: Type of screening: Colonoscopy. Completed 01/29/20. Repeat every 5 years  Mammogram status: Completed 10/08/18. Repeat every year. Ordered today.   Bone density status: due age 55  Lung Cancer Screening: (Low Dose CT Chest recommended if Age  39-80 years, 30 pack-year currently smoking OR have quit w/in 15years.) does not qualify.    Additional Screening:  Hepatitis C Screening: does qualify; Completed 07/14/18  Vision Screening: Recommended annual ophthalmology exams for early detection of glaucoma and other disorders of the eye. Is the patient up to date with their annual eye exam?  Yes  Who is the provider or what is the name of the office in which the patient attends annual eye exams? [facility]  Dental Screening: Recommended annual dental exams for proper oral hygiene  Community Resource Referral / Chronic Care Management: CRR required this visit?  No   CCM required this visit?  No      Plan:     I have personally reviewed and noted the following in the patient's chart:   . Medical and social history . Use of alcohol, tobacco or illicit drugs  . Current medications and supplements . Functional ability and status . Nutritional status . Physical activity . Advanced directives . List of other physicians . Hospitalizations, surgeries, and ER visits in previous 12 months . Vitals . Screenings to include cognitive, depression, and falls . Referrals and appointments  In addition, I have reviewed and discussed with patient certain preventive protocols, quality metrics, and best practice recommendations. A written personalized care plan for preventive services as well as general preventive health recommendations were provided to patient.      Roswell Miners  Uthus, LPN   [id number]   Nurse Notes: none

## 2020-12-06 NOTE — Patient Instructions (Signed)
Heather Jefferson , Thank you for taking time to come for your Medicare Wellness Visit. I appreciate your ongoing commitment to your health goals. Please review the following plan we discussed and let me know if I can assist you in the future.   Screening recommendations/referrals: Colonoscopy: done 01/29/20. Repeat in 2026 Mammogram: done 10/08/18. Please call [phone] to schedule your mammogram.  Bone Density: due age 50 Recommended yearly ophthalmology/optometry visit for glaucoma screening and checkup Recommended yearly dental visit for hygiene and checkup  Vaccinations: Influenza vaccine: done 08/12/20 Pneumococcal vaccine: done 05/25/13 Tdap vaccine: done 09/30/18 Shingles vaccine: Shingrix discussed. Please contact your pharmacy for coverage information.  Covid-19: done 12/10/19 & 12/31/19; please bring your vaccine record to next appt for additional vaccine information  Advanced directives: Advance directive discussed with you today. I have provided a copy for you to complete at home and have notarized. Once this is complete please bring a copy in to our office so we can scan it into your chart.  Conditions/risks identified: Recommend healthy eating and physical activity for desired weight loss.   Next appointment: Follow up in one year for your annual wellness visit.   Preventive Care 40-64 Years, Female Preventive care refers to lifestyle choices and visits with your health care provider that can promote health and wellness. What does preventive care include?  A yearly physical exam. This is also called an annual well check.  Dental exams once or twice a year.  Routine eye exams. Ask your health care provider how often you should have your eyes checked.  Personal lifestyle choices, including:  Daily care of your teeth and gums.  Regular physical activity.  Eating a healthy diet.  Avoiding tobacco and drug use.  Limiting alcohol use.  Practicing safe sex.  Taking  low-dose aspirin daily starting at age 46.  Taking vitamin and mineral supplements as recommended by your health care provider. What happens during an annual well check? The services and screenings done by your health care provider during your annual well check will depend on your age, overall health, lifestyle risk factors, and family history of disease. Counseling  Your health care provider may ask you questions about your:  Alcohol use.  Tobacco use.  Drug use.  Emotional well-being.  Home and relationship well-being.  Sexual activity.  Eating habits.  Work and work Statistician.  Method of birth control.  Menstrual cycle.  Pregnancy history. Screening  You may have the following tests or measurements:  Height, weight, and BMI.  Blood pressure.  Lipid and cholesterol levels. These may be checked every 5 years, or more frequently if you are over 38 years old.  Skin check.  Lung cancer screening. You may have this screening every year starting at age 45 if you have a 30-pack-year history of smoking and currently smoke or have quit within the past 15 years.  Fecal occult blood test (FOBT) of the stool. You may have this test every year starting at age 44.  Flexible sigmoidoscopy or colonoscopy. You may have a sigmoidoscopy every 5 years or a colonoscopy every 10 years starting at age 61.  Hepatitis C blood test.  Hepatitis B blood test.  Sexually transmitted disease (STD) testing.  Diabetes screening. This is done by checking your blood sugar (glucose) after you have not eaten for a while (fasting). You may have this done every 1-3 years.  Mammogram. This may be done every 1-2 years. Talk to your health care provider about when you should start  having regular mammograms. This may depend on whether you have a family history of breast cancer.  BRCA-related cancer screening. This may be done if you have a family history of breast, ovarian, tubal, or peritoneal  cancers.  Pelvic exam and Pap test. This may be done every 3 years starting at age 74. Starting at age 62, this may be done every 5 years if you have a Pap test in combination with an HPV test.  Bone density scan. This is done to screen for osteoporosis. You may have this scan if you are at high risk for osteoporosis. Discuss your test results, treatment options, and if necessary, the need for more tests with your health care provider. Vaccines  Your health care provider may recommend certain vaccines, such as:  Influenza vaccine. This is recommended every year.  Tetanus, diphtheria, and acellular pertussis (Tdap, Td) vaccine. You may need a Td booster every 10 years.  Zoster vaccine. You may need this after age 85.  Pneumococcal 13-valent conjugate (PCV13) vaccine. You may need this if you have certain conditions and were not previously vaccinated.  Pneumococcal polysaccharide (PPSV23) vaccine. You may need one or two doses if you smoke cigarettes or if you have certain conditions. Talk to your health care provider about which screenings and vaccines you need and how often you need them. This information is not intended to replace advice given to you by your health care provider. Make sure you discuss any questions you have with your health care provider. Document Released: 10/07/2015 Document Revised: 05/30/2016 Document Reviewed: 07/12/2015 Elsevier Interactive Patient Education  2017 [place] Prevention in the Home Falls can cause injuries. They can happen to people of all ages. There are many things you can do to make your home safe and to help prevent falls. What can I do on the outside of my home?  Regularly fix the edges of walkways and driveways and fix any cracks.  Remove anything that might make you trip as you walk through a door, such as a raised step or threshold.  Trim any bushes or trees on the path to your home.  Use bright outdoor lighting.  Clear  any walking paths of anything that might make someone trip, such as rocks or tools.  Regularly check to see if handrails are loose or broken. Make sure that both sides of any steps have handrails.  Any raised decks and porches should have guardrails on the edges.  Have any leaves, snow, or ice cleared regularly.  Use sand or salt on walking paths during winter.  Clean up any spills in your garage right away. This includes oil or grease spills. What can I do in the bathroom?  Use night lights.  Install grab bars by the toilet and in the tub and shower. Do not use towel bars as grab bars.  Use non-skid mats or decals in the tub or shower.  If you need to sit down in the shower, use a plastic, non-slip stool.  Keep the floor dry. Clean up any water that spills on the floor as soon as it happens.  Remove soap buildup in the tub or shower regularly.  Attach bath mats securely with double-sided non-slip rug tape.  Do not have throw rugs and other things on the floor that can make you trip. What can I do in the bedroom?  Use night lights.  Make sure that you have a light by your bed that is easy to  reach.  Do not use any sheets or blankets that are too big for your bed. They should not hang down onto the floor.  Have a firm chair that has side arms. You can use this for support while you get dressed.  Do not have throw rugs and other things on the floor that can make you trip. What can I do in the kitchen?  Clean up any spills right away.  Avoid walking on wet floors.  Keep items that you use a lot in easy-to-reach places.  If you need to reach something above you, use a strong step stool that has a grab bar.  Keep electrical cords out of the way.  Do not use floor polish or wax that makes floors slippery. If you must use wax, use non-skid floor wax.  Do not have throw rugs and other things on the floor that can make you trip. What can I do with my stairs?  Do not  leave any items on the stairs.  Make sure that there are handrails on both sides of the stairs and use them. Fix handrails that are broken or loose. Make sure that handrails are as long as the stairways.  Check any carpeting to make sure that it is firmly attached to the stairs. Fix any carpet that is loose or worn.  Avoid having throw rugs at the top or bottom of the stairs. If you do have throw rugs, attach them to the floor with carpet tape.  Make sure that you have a light switch at the top of the stairs and the bottom of the stairs. If you do not have them, ask someone to add them for you. What else can I do to help prevent falls?  Wear shoes that:  Do not have high heels.  Have rubber bottoms.  Are comfortable and fit you well.  Are closed at the toe. Do not wear sandals.  If you use a stepladder:  Make sure that it is fully opened. Do not climb a closed stepladder.  Make sure that both sides of the stepladder are locked into place.  Ask someone to hold it for you, if possible.  Clearly mark and make sure that you can see:  Any grab bars or handrails.  First and last steps.  Where the edge of each step is.  Use tools that help you move around (mobility aids) if they are needed. These include:  Canes.  Walkers.  Scooters.  Crutches.  Turn on the lights when you go into a dark area. Replace any light bulbs as soon as they burn out.  Set up your furniture so you have a clear path. Avoid moving your furniture around.  If any of your floors are uneven, fix them.  If there are any pets around you, be aware of where they are.  Review your medicines with your doctor. Some medicines can make you feel dizzy. This can increase your chance of falling. Ask your doctor what other things that you can do to help prevent falls. This information is not intended to replace advice given to you by your health care provider. Make sure you discuss any questions you have with  your health care provider. Document Released: 07/07/2009 Document Revised: 02/16/2016 Document Reviewed: 10/15/2014 Elsevier Interactive Patient Education  2017 [employer].

## 2020-12-26 ENCOUNTER — Ambulatory Visit (INDEPENDENT_AMBULATORY_CARE_PROVIDER_SITE_OTHER): Payer: BC Managed Care – PPO | Admitting: Licensed Clinical Social Worker

## 2020-12-26 ENCOUNTER — Other Ambulatory Visit: Payer: Self-pay

## 2020-12-26 DIAGNOSIS — F4312 Post-traumatic stress disorder, chronic: Secondary | ICD-10-CM | POA: Diagnosis not present

## 2020-12-26 NOTE — Progress Notes (Signed)
Virtual Visit via Video Note  I connected with Heather Jefferson on 12/26/20 at  1:00 PM EDT by a video enabled telemedicine application and verified that I am speaking with the correct person using two identifiers.  Location: Patient: home Provider: remote office [place], [place])   I discussed the limitations of evaluation and management by telemedicine and the availability of in person appointments. The patient expressed understanding and agreed to proceed.   I discussed the assessment and treatment plan with the patient. The patient was provided an opportunity to ask questions and all were answered. The patient agreed with the plan and demonstrated an understanding of the instructions.   The patient was advised to call back or seek an in-person evaluation if the symptoms worsen or if the condition fails to improve as anticipated.  I provided 45 minutes of non-face-to-face time during this encounter.  Anusha Claus R Seena Face, LCSW   THERAPIST PROGRESS NOTE  Session Time: 1-1:45p  Participation Level: Active  Behavioral Response: Neat and Well GroomedAlertAnxious and Depressed  Type of Therapy: Individual Therapy  Treatment Goals addressed: Anxiety and Coping  Interventions: Other: trauma focused  Summary: Heather Jefferson is a 50 y.o. female who presents with improving symptoms related to PTSD. Pt reports that overall mood has been stable and that she is managing stress and anxiety well. Pt reports fair quality and quantity of sleep--pt feels that recent chronic pain has been triggered by fibromyalgia.   Allowed pt to explore and express thoughts and feelings associated with adopted daughter--pt made a post on FB group and someone responded with the same date of birth as pts daughter. Individual asked pt questions and then stated that she was going to complete the ancestry test. Explored feelings that were triggered by the interaction and also impact on family  members.  Discussed pts concern about depression and anxiety in son and daughter.  Continued recommendations are as follows: self care behaviors, positive social engagements, focusing on overall work/home/life balance, and focusing on positive physical and emotional wellness.   Suicidal/Homicidal: No  Therapist Response: Heather Jefferson is continuing to stabilize mood and identify anxiety triggers and utilizing anxiety management coping skills. Lajuan is able to identify, challenge, and replace fearful self talk with positive, realistic, and empowering self talk. Gurpreet has set goals for herself and has been assertive in her ability to take steps towards achieving goals. These behaviors are reflective of personal growth and progress.   Plan: Return again in 4 weeks.  Diagnosis: Axis I: Post Traumatic Stress Disorder    Axis II: No diagnosis    [place], LCSW 12/26/2020

## 2021-01-13 ENCOUNTER — Other Ambulatory Visit (HOSPITAL_COMMUNITY): Payer: Self-pay | Admitting: Psychiatry

## 2021-01-13 DIAGNOSIS — F3130 Bipolar disorder, current episode depressed, mild or moderate severity, unspecified: Secondary | ICD-10-CM

## 2021-01-23 ENCOUNTER — Ambulatory Visit (INDEPENDENT_AMBULATORY_CARE_PROVIDER_SITE_OTHER): Payer: BC Managed Care – PPO | Admitting: Licensed Clinical Social Worker

## 2021-01-23 ENCOUNTER — Other Ambulatory Visit: Payer: Self-pay

## 2021-01-23 DIAGNOSIS — F4312 Post-traumatic stress disorder, chronic: Secondary | ICD-10-CM

## 2021-01-23 NOTE — Progress Notes (Signed)
Virtual Visit via Video Note  I connected with Heather Jefferson on 01/23/21 at  1:00 PM EDT by a video enabled telemedicine application and verified that I am speaking with the correct person using two identifiers.  Location: Patient: home Provider: remote office [place], [place])   I discussed the limitations of evaluation and management by telemedicine and the availability of in person appointments. The patient expressed understanding and agreed to proceed.  I discussed the assessment and treatment plan with the patient. The patient was provided an opportunity to ask questions and all were answered. The patient agreed with the plan and demonstrated an understanding of the instructions.   The patient was advised to call back or seek an in-person evaluation if the symptoms worsen or if the condition fails to improve as anticipated.  I provided 45 minutes of non-face-to-face time during this encounter.   Heather Dworkin R Blessings [place], LCSW    THERAPIST PROGRESS NOTE  Session Time: 1-1:45p  Participation Level: Active  Behavioral Response: Neat and Well GroomedAlertDepressed  Type of Therapy: Individual Therapy  Treatment Goals addressed: Anxiety and Coping  Interventions: CBT and Other: trauma-focused  Summary: Heather Jefferson is a 50 y.o. female who presents with symptoms consistent with PTSD diagnosis. Pt reports that overall mood has been stable and that pt is managing stress and anxiety fairly well. Currently pt is struggling with mother--pts mother was taken to [place] with pts sister on her birthday and "ghosted" pt and her children because they didn't give her money on her birthday.   Allowed pt to process through thoughts and feelings associated with this most recent situation involving her mother and other situations involving her mother from years past.   Discussed pts birthday--husband and children made a big celebration and pts mother declined to come. Pt then declined to see  her mother on her birthday, and that is when the mother "ghosted" prior to her trip. Discussed relationship with sister and other family members.  Continued recommendations are as follows: self care behaviors, positive social engagements, focusing on overall work/home/life balance, and focusing on positive physical and emotional wellness.   Suicidal/Homicidal: No  Therapist Response: Viva is continuing to stabilize mood and identify anxiety triggers and utilizing anxiety management coping skills. Adrain is able to identify, challenge, and replace fearful self talk with positive, realistic, and empowering self talk. Kimyah has set goals for herself and has been assertive in her ability to take steps towards achieving goals. These behaviors are reflective of personal growth and progress. Symptoms do fluctuate at times depending on situational stressors.   Plan: Return again in 3 weeks.  Diagnosis: Axis I: Post Traumatic Stress Disorder    Axis II: No diagnosis    [place], LCSW 01/23/2021

## 2021-02-02 ENCOUNTER — Other Ambulatory Visit (HOSPITAL_COMMUNITY): Payer: Self-pay | Admitting: Psychiatry

## 2021-02-02 DIAGNOSIS — F3130 Bipolar disorder, current episode depressed, mild or moderate severity, unspecified: Secondary | ICD-10-CM

## 2021-02-02 DIAGNOSIS — E119 Type 2 diabetes mellitus without complications: Secondary | ICD-10-CM | POA: Diagnosis not present

## 2021-02-06 ENCOUNTER — Encounter (HOSPITAL_COMMUNITY): Payer: Self-pay | Admitting: Psychiatry

## 2021-02-06 ENCOUNTER — Other Ambulatory Visit: Payer: Self-pay

## 2021-02-06 ENCOUNTER — Telehealth (INDEPENDENT_AMBULATORY_CARE_PROVIDER_SITE_OTHER): Payer: BC Managed Care – PPO | Admitting: Psychiatry

## 2021-02-06 DIAGNOSIS — F3130 Bipolar disorder, current episode depressed, mild or moderate severity, unspecified: Secondary | ICD-10-CM

## 2021-02-06 DIAGNOSIS — F4312 Post-traumatic stress disorder, chronic: Secondary | ICD-10-CM | POA: Diagnosis not present

## 2021-02-06 MED ORDER — ARIPIPRAZOLE 20 MG PO TABS: 20.0000 mg | ORAL_TABLET | Freq: Every day | ORAL | 0 refills | Status: DC

## 2021-02-06 MED ORDER — TOPIRAMATE 100 MG PO TABS: 100.0000 mg | ORAL_TABLET | Freq: Every day | ORAL | 0 refills | Status: DC

## 2021-02-06 MED ORDER — TEMAZEPAM 30 MG PO CAPS: 30.0000 mg | ORAL_CAPSULE | Freq: Every evening | ORAL | 2 refills | Status: DC | PRN

## 2021-02-06 MED ORDER — BUPROPION HCL ER (XL) 150 MG PO TB24: 1.0000 | ORAL_TABLET | Freq: Every morning | ORAL | 0 refills | Status: DC

## 2021-02-06 MED ORDER — DESVENLAFAXINE SUCCINATE ER 100 MG PO TB24
100.0000 mg | ORAL_TABLET | Freq: Every day | ORAL | 0 refills | Status: DC
Start: 2021-02-06 — End: 2021-07-04

## 2021-02-06 NOTE — Progress Notes (Signed)
[facility] MD OP Progress Note  Virtual Visit via Video Note  I connected with Heather Jefferson on 02/06/21 at  2:00 PM EDT by a video enabled telemedicine application and verified that I am speaking with the correct person using two identifiers.  Location: Patient: Home Provider: Clinic   I discussed the limitations of evaluation and management by telemedicine and the availability of in person appointments. The patient expressed understanding and agreed to proceed.  I provided 14 minutes of non-face-to-face time during this encounter.        02/06/2021 1:56 PM Heather Jefferson  MRN:  [id number]  Chief Complaint:  " I am doing well. The combination of medications is helping."  HPI: Patient follows she is doing well on the current combination of medications.  She stated that she has some crying spells here and there but overall her mood has been stable.  She is sleeping well at night. She denies any acute issues or concerns today. She reports she does not have any specific plans for the summer but she showed her family will come up with something and then will go somewhere.    Visit Diagnosis: Bipolar I disorder, most recent episode depressed ([place])  Chronic post-traumatic stress disorder (PTSD)   Past Psychiatric History: Bipolar I disorder, most recent episode depressed ([place])  Chronic post-traumatic stress disorder (PTSD)   Past Medical History:  Past Medical History:  Diagnosis Date  . ADHD (attention deficit hyperactivity disorder)   . Allergy   . Anemia   . Anxiety   . Depression   . Diabetes mellitus, type II ([place])   . Fibromyalgia   . Generalized anxiety disorder   . Headache   . HIV infection ([place])   . Hypertension   . Hypothyroidism   . Major depressive disorder, recurrent episode, moderate ([place])   . Migraine with acute onset aura   . Persistent disorder of initiating or maintaining sleep   . PTSD (post-traumatic stress disorder)   . Restless leg   .  Seizure disorder ([place])   . Thyroid disease   . Vitamin D deficiency     Past Surgical History:  Procedure Laterality Date  . COLONOSCOPY WITH PROPOFOL N/A 01/29/2020   Procedure: COLONOSCOPY WITH PROPOFOL;  Surgeon: Jonathon Bellows, MD;  Location: [facility] ENDOSCOPY;  Service: Gastroenterology;  Laterality: N/A;  . EYE SURGERY    . TUBAL LIGATION      Family Psychiatric History: depression, anxiety in siblings  Family History:  Family History  Problem Relation Age of Onset  . Hypertension Mother   . Heart attack Mother   . CAD Mother   . Diabetes Father   . Hypertension Father   . Hypertension Sister   . Anxiety disorder Brother   . Depression Brother     Social History:  Social History   Socioeconomic History  . Marital status: Married    Spouse name: roberto  . Number of children: 2  . Years of education: Not on file  . Highest education level: 10th grade  Occupational History  . Occupation: disabled  Tobacco Use  . Smoking status: Never Smoker  . Smokeless tobacco: Never Used  Vaping Use  . Vaping Use: Never used  Substance and Sexual Activity  . Alcohol use: Yes    Alcohol/week: 0.0 standard drinks    Comment: none last 24 hrs  . Drug use: No  . Sexual activity: Not Currently    Partners: Male    Birth control/protection: Other-see comments,  I.U.D.    Comment: husband has ED  Other Topics Concern  . 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 Difficulty of Paying Living Expenses: Not hard at all  Food Insecurity: No Food Insecurity  . Worried About Charity fundraiser in the Last Year: Never true  . Ran Out of Food in the Last Year: Never true  Transportation Needs: No Transportation Needs  . Lack of Transportation (Medical): No  . Lack of Transportation (Non-Medical): No  Physical  Activity: Inactive  . Days of Exercise per Week: 0 days  . Minutes of Exercise per Session: 0 min  Stress: Stress Concern Present  . Feeling of Stress : Rather much  Social Connections: Moderately Integrated  . Frequency of Communication with Friends and Family: More than three times a week  . Frequency of Social Gatherings with Friends and Family: Once a week  . Attends Religious Services: More than 4 times per year  . Active Member of Clubs or Organizations: No  . Attends Archivist Meetings: Never  . Marital Status: Married    Allergies:  Allergies  Allergen Reactions  . Amoxicillin-Pot Clavulanate Hives  . Losartan Cough  . Metformin And Related Diarrhea and Nausea And Vomiting    Metabolic Disorder Labs: Lab Results  Component Value Date   HGBA1C 5.8 (H) 05/10/2020   MPG 120 05/10/2020   MPG 103 05/04/2019   No results found for: PROLACTIN Lab Results  Component Value Date   CHOL 212 (H) 05/10/2020   TRIG 231 (H) 05/10/2020   HDL 53 05/10/2020   CHOLHDL 4.0 05/10/2020   VLDL 38 (H) 10/18/2016   LDLCALC 124 (H) 05/10/2020   LDLCALC 125 (H) 05/04/2019   Lab Results  Component Value Date   TSH 3.12 11/17/2020   TSH 5.06 (H) 09/08/2020    Therapeutic Level Labs: No results found for: LITHIUM No results found for: VALPROATE No components found for:  CBMZ  Current Medications: Current Outpatient Medications  Medication Sig Dispense Refill  . amLODipine (NORVASC) 5 MG tablet Take 5 mg by mouth daily.    . ARIPiprazole (ABILIFY) 20 MG tablet Take 1 tablet (20 mg total) by mouth daily. 90 tablet 0  . buPROPion (WELLBUTRIN XL) 150 MG 24 hr tablet TAKE 1 TABLET BY MOUTH IN THE MORNING 30 tablet 0  . desvenlafaxine (PRISTIQ) 100 MG 24 hr tablet Take 1 tablet (100 mg total) by mouth daily. 90 tablet 3  . hydroxychloroquine (PLAQUENIL) 200 MG tablet Take 200 mg by mouth 2 (two) times daily.    Marland Kitchen levothyroxine (SYNTHROID) 25 MCG tablet TAKE 1 TABLET DAILY  AND 2 TABLETS (50 MCG)  ON SUNDAYS 102 tablet 3  . MIRENA, 52 MG, 20 MCG/24HR IUD     . prednisoLONE acetate (PRED FORTE) 1 % ophthalmic suspension Place 1 drop into the right eye daily.     . pregabalin (LYRICA) 150 MG capsule Take 1 capsule (150 mg total) by mouth at bedtime. 90 capsule 0  . SUMAtriptan (IMITREX) 20 MG/ACT nasal spray Place 1 spray (20 mg total) into the nose every 2 (two) hours as needed for migraine. 3 each 2  . temazepam (RESTORIL) 30 MG capsule Take 1 capsule (30 mg total) by mouth at bedtime as needed for sleep.  30 capsule 2  . topiramate (TOPAMAX) 100 MG tablet Take 1 tablet (100 mg total) by mouth daily. 90 tablet 0  . torsemide (DEMADEX) 5 MG tablet Take 5 mg by mouth daily.    . Vitamin D, Ergocalciferol, (DRISDOL) 1.25 MG (50000 UNIT) CAPS capsule Take 1 capsule (50,000 Units total) by mouth every 7 (seven) days. 12 capsule 1   No current facility-administered medications for this visit.     Psychiatric Specialty Exam: ROS  There were no vitals taken for this visit.There is no height or weight on file to calculate BMI.  General Appearance: Fairly Groomed  Eye Contact:  Good  Speech:  Clear and Coherent  Volume:  Normal  Mood:  Euthymic  Affect:  Congruent  Thought Process:  Goal Directed, Linear and Descriptions of Associations: Intact  Orientation:  Full (Time, Place, and Person)  Thought Content: Logical   Suicidal Thoughts:  No  Homicidal Thoughts:  No  Memory:  Recent;   Good Remote;   Good  Judgement:  Fair  Insight:  Fair  Psychomotor Activity:  Normal  Concentration:  Concentration: Good and Attention Span: Good  Recall:  Good  Fund of Knowledge: Good  Language: Good  Akathisia:  No  Handed:  Right  AIMS (if indicated): not done  Assets:  Communication Skills Desire for Improvement Financial Resources/Insurance Housing Social Support Transportation  ADL's:  Intact  Cognition: WNL  Sleep:  Good   Screenings: GAD-7   Forensic scientist from 10/31/2020 in [facility] Office Visit from 05/10/2020 in [facility] Office Visit from 08/04/2019 in [name] Office Visit from 07/08/2018 in [facility] Office Visit from 05/07/2018 in  [facility]  Total GAD-7 Score 10 18 16 16 6     PHQ2-9   Flowsheet Row Video Visit from 02/06/2021 in [place] from 12/06/2020 in [facility] Office Visit from 11/17/2020 in [facility] Counselor from 10/31/2020 in [place] Office Visit from 08/12/2020 in [facility]  PHQ-2 Total Score 1 6 4 5 4   PHQ-9 Total Score -- 22 18 19 15     Flowsheet Row Video Visit from 02/06/2021 in [facility] Counselor from 01/23/2021 in [place] from 12/26/2020 in [facility] No Risk No Risk No Risk       Assessment and Plan: Patient seems to be doing well at present, her symptoms are in remission.  1. Bipolar I disorder, most recent episode depressed ([facility])  - ARIPiprazole (ABILIFY) 20 MG tablet; Take 1 tablet (20 mg total) by mouth daily.  Dispense: 90 tablet; Refill: 0 - desvenlafaxine (PRISTIQ) 100 MG 24 hr tablet; Take 1 tablet (100 mg total) by mouth daily.  Dispense: 90 tablet; Refill: 3 - temazepam (RESTORIL) 30 MG capsule; Take 1 capsule (30 mg total) by mouth at bedtime as needed for sleep.  Dispense: 30 capsule; Refill: 2 - topiramate (TOPAMAX) 100 MG tablet; Take 1 tablet (100 mg total) by mouth daily.  Dispense: 90 tablet; Refill: 0 -  buPROPion (WELLBUTRIN XL) 150 MG 24 hr tablet; Take 1 tablet (150 mg total) by mouth every morning.  Dispense: 90 tablet; Refill: 0  2. Chronic post-traumatic stress disorder (PTSD)  - ARIPiprazole (ABILIFY) 20 MG  tablet; Take 1 tablet (20 mg total) by mouth daily.  Dispense: 90 tablet; Refill:  0 - desvenlafaxine (PRISTIQ) 100 MG 24 hr tablet; Take 1 tablet (100 mg total) by mouth daily.  Dispense: 90 tablet; Refill: 3 - topiramate (TOPAMAX) 100 MG tablet; Take 1 tablet (100 mg total) by mouth daily.  Dispense: 90 tablet; Refill: 0   Continue individual therapy with Ms. Kandice Moos. Continue same medication regimen. Follow-up in 3 months.  Patient was informed that her care is being transferred to a different provider at [facility] psychiatry clinic.  She verbalized her understanding.    [place] Crane, MD 02/06/2021, 1:56 PM
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
## 2021-02-15 ENCOUNTER — Encounter: Payer: Self-pay | Admitting: Internal Medicine

## 2021-02-15 ENCOUNTER — Encounter: Payer: Self-pay | Admitting: Family Medicine

## 2021-02-15 ENCOUNTER — Ambulatory Visit (INDEPENDENT_AMBULATORY_CARE_PROVIDER_SITE_OTHER): Payer: Medicare Other | Admitting: Family Medicine

## 2021-02-15 ENCOUNTER — Other Ambulatory Visit: Payer: Self-pay

## 2021-02-15 VITALS — BP 132/84 | HR 69 | Temp 98.2°F | Resp 16 | Ht 74.0 in | Wt 291.3 lb

## 2021-02-15 DIAGNOSIS — I10 Essential (primary) hypertension: Secondary | ICD-10-CM | POA: Diagnosis not present

## 2021-02-15 DIAGNOSIS — M797 Fibromyalgia: Secondary | ICD-10-CM | POA: Diagnosis not present

## 2021-02-15 DIAGNOSIS — R058 Other specified cough: Secondary | ICD-10-CM

## 2021-02-15 DIAGNOSIS — E559 Vitamin D deficiency, unspecified: Secondary | ICD-10-CM

## 2021-02-15 DIAGNOSIS — E785 Hyperlipidemia, unspecified: Secondary | ICD-10-CM | POA: Diagnosis not present

## 2021-02-15 DIAGNOSIS — M059 Rheumatoid arthritis with rheumatoid factor, unspecified: Secondary | ICD-10-CM

## 2021-02-15 DIAGNOSIS — G43009 Migraine without aura, not intractable, without status migrainosus: Secondary | ICD-10-CM

## 2021-02-15 DIAGNOSIS — E213 Hyperparathyroidism, unspecified: Secondary | ICD-10-CM

## 2021-02-15 DIAGNOSIS — F3160 Bipolar disorder, current episode mixed, unspecified: Secondary | ICD-10-CM | POA: Diagnosis not present

## 2021-02-15 DIAGNOSIS — E8881 Metabolic syndrome: Secondary | ICD-10-CM

## 2021-02-15 DIAGNOSIS — E88819 Insulin resistance, unspecified: Secondary | ICD-10-CM

## 2021-02-15 DIAGNOSIS — E039 Hypothyroidism, unspecified: Secondary | ICD-10-CM

## 2021-02-15 DIAGNOSIS — F331 Major depressive disorder, recurrent, moderate: Secondary | ICD-10-CM | POA: Diagnosis not present

## 2021-02-15 MED ORDER — PREGABALIN 50 MG PO CAPS: 50.0000 mg | ORAL_CAPSULE | Freq: Two times a day (BID) | ORAL | 0 refills | Status: DC

## 2021-02-15 MED ORDER — SAXENDA 18 MG/3ML ~~LOC~~ SOPN: 0.6000 mg | PEN_INJECTOR | Freq: Every day | SUBCUTANEOUS | 2 refills | Status: DC

## 2021-02-15 MED ORDER — LEVOTHYROXINE SODIUM 25 MCG PO TABS: 25.0000 ug | ORAL_TABLET | Freq: Every day | ORAL | 3 refills | Status: DC

## 2021-02-15 MED ORDER — SUMATRIPTAN 20 MG/ACT NA SOLN: 20.0000 mg | NASAL | 2 refills | Status: DC | PRN

## 2021-02-15 MED ORDER — PREGABALIN 150 MG PO CAPS: 150.0000 mg | ORAL_CAPSULE | Freq: Every evening | ORAL | 1 refills | Status: DC

## 2021-02-15 MED ORDER — INSULIN PEN NEEDLE 32G X 6 MM MISC: 1.0000 | Freq: Every day | 1 refills | Status: DC

## 2021-02-15 NOTE — Patient Instructions (Signed)
Saxenda Start at 0.6 mg first week 1.2 mg second week 1.8 mg third week 2.4 mg forth week 3.0 mg after that

## 2021-02-17 ENCOUNTER — Encounter: Payer: Self-pay | Admitting: Internal Medicine

## 2021-02-17 ENCOUNTER — Ambulatory Visit (INDEPENDENT_AMBULATORY_CARE_PROVIDER_SITE_OTHER): Payer: Medicare Other | Admitting: Licensed Clinical Social Worker

## 2021-02-17 ENCOUNTER — Other Ambulatory Visit: Payer: Self-pay

## 2021-02-17 DIAGNOSIS — F4312 Post-traumatic stress disorder, chronic: Secondary | ICD-10-CM | POA: Diagnosis not present

## 2021-02-17 DIAGNOSIS — F3130 Bipolar disorder, current episode depressed, mild or moderate severity, unspecified: Secondary | ICD-10-CM | POA: Diagnosis not present

## 2021-02-17 NOTE — Progress Notes (Signed)
Virtual Visit via Video Note  I connected with Heather Jefferson on 02/17/21 at  9:00 AM EDT by a video enabled telemedicine application and verified that I am speaking with the correct person using two identifiers.  Location: Patient: home Provider: remote office [place], [place])   I discussed the limitations of evaluation and management by telemedicine and the availability of in person appointments. The patient expressed understanding and agreed to proceed.  I discussed the assessment and treatment plan with the patient. The patient was provided an opportunity to ask questions and all were answered. The patient agreed with the plan and demonstrated an understanding of the instructions.   The patient was advised to call back or seek an in-person evaluation if the symptoms worsen or if the condition fails to improve as anticipated.  I provided 24 minutes of non-face-to-face time during this encounter.   Randol Zumstein R Costa Jha, LCSW   THERAPIST PROGRESS NOTE  Session Time: 9-9:24a  Participation Level: Active  Behavioral Response: Neat and Well GroomedAlertDepressed  Type of Therapy: Individual Therapy  Treatment Goals addressed: Coping  Interventions: CBT, Strength-based and Other: trauma informed  Summary: Heather Jefferson is a 50 y.o. female who presents with improving symptoms related to bipolar disorder and PTSD.  Pt reports that mood has been stable since last session. Pt reports that her anxiety has been a bit higher and pt feels that she's not been able to redirect herself well. Pt reporting good quality and quantity of sleep--able to sleep well throughout the night.   Allowed pt safe space to explore thoughts and feelings surrounding recent life events. Pt reports that relationships are good with her children and other family members. Pt feels that she has continued to set boundaries with her mother and feels that this has been a good thing for her. Validated pts  feelings and offered unconditional positive regard.   Discussed pts overall management of chronic pain--pt recognizes that its a fine balance of activity and rest and is trying hard to keep pain levels to a low.  Continued recommendations are as follows: self care behaviors, positive social engagements, focusing on overall work/home/life balance, and focusing on positive physical and emotional wellness.   Suicidal/Homicidal: No  Therapist Response: Heather Jefferson is continuing to stabilize mood and identify anxiety triggers and utilizing anxiety management coping skills. Heather Jefferson is able to identify, challenge, and replace fearful self talk with positive, realistic, and empowering self talk. Heather Jefferson has set goals for herself and has been assertive in her ability to take steps towards achieving goals. These behaviors are reflective of personal growth and progress. Symptoms do fluctuate at times depending on situational stressors.  Plan: Return again in 4 weeks.  Diagnosis: Axis I: Bipolar, Depressed and Post Traumatic Stress Disorder    Axis II: No diagnosis    [place], LCSW 02/17/2021

## 2021-03-20 DIAGNOSIS — M25561 Pain in right knee: Secondary | ICD-10-CM | POA: Diagnosis not present

## 2021-03-20 DIAGNOSIS — Z79899 Other long term (current) drug therapy: Secondary | ICD-10-CM | POA: Diagnosis not present

## 2021-03-20 DIAGNOSIS — M797 Fibromyalgia: Secondary | ICD-10-CM | POA: Diagnosis not present

## 2021-03-20 DIAGNOSIS — M059 Rheumatoid arthritis with rheumatoid factor, unspecified: Secondary | ICD-10-CM | POA: Diagnosis not present

## 2021-03-22 ENCOUNTER — Other Ambulatory Visit: Payer: Self-pay

## 2021-03-22 ENCOUNTER — Ambulatory Visit (INDEPENDENT_AMBULATORY_CARE_PROVIDER_SITE_OTHER): Payer: Medicare Other | Admitting: Licensed Clinical Social Worker

## 2021-03-22 DIAGNOSIS — F4312 Post-traumatic stress disorder, chronic: Secondary | ICD-10-CM

## 2021-03-22 NOTE — Progress Notes (Signed)
Virtual Visit via Video Note  I connected with Heather Jefferson on 03/22/21 at  1:00 PM EDT by a video enabled telemedicine application and verified that I am speaking with the correct person using two identifiers.  Location: Patient: home Provider: [facility]   I discussed the limitations of evaluation and management by telemedicine and the availability of in person appointments. The patient expressed understanding and agreed to proceed.   I discussed the assessment and treatment plan with the patient. The patient was provided an opportunity to ask questions and all were answered. The patient agreed with the plan and demonstrated an understanding of the instructions.   The patient was advised to call back or seek an in-person evaluation if the symptoms worsen or if the condition fails to improve as anticipated.  I provided 35 minutes of non-face-to-face time during this encounter.   Virgil Slinger R Leandre Wien, LCSW   THERAPIST PROGRESS NOTE  Session Time: 1-1:35p  Participation Level: Active  Behavioral Response: Neat and Well GroomedAlertDepressed  Type of Therapy: Individual Therapy  Treatment Goals addressed: Coping and Diagnosis: MDD/PTSD  Interventions: CBT and Other: trauma focused  Summary: Heather Jefferson is a 50 y.o. female who presents with an increase in depression symptoms. Patient reports an increase in tearfulness, difficulty concentrating, irritability, and sleep related concerns. Patient also reports an increase in anxiety symptoms including restlessness, irritability, worrying, and feeling anxious/on the edge.   Discussed current medication management of symptoms and patient feels that recent medication changes have been beneficial. Patient has appointment with new psychiatric provider in August to take the place of her current psychiatrist.  Allowed patient to explore and express thoughts and feelings about recent life events and external stressors. Patient reports  that she's been having an increase in flashbacks associated with her surrendering her first daughter for adoption. Allowed patient to explore traumatic memories, and discuss thoughts and feelings associated with these memories. Patient came to the conclusion that she did give up her daughter around this time of year, as if she feels that the upcoming July 4th holiday is a triggering event. Encouraged patient to engage in recreational activities to help pair brain with positive activities surrounding July 4th. Patient agrees and will try. Discussed letter writing interventions that have been brought up in the past as a tool to help patient forgive herself.   Explored other interventions that patient is trying to maintain levels of progress in depression management. Patient is trying to be more active throughout the day, and is taking steps towards being more social with individuals outside of family. Patient states that she has reached out to daughters godmother to see if daughter can stay with her in [place].  Continued recommendations are as follows: self care behaviors, positive social engagements, focusing on overall work/home/life balance, and focusing on positive physical and emotional wellness.   Suicidal/Homicidal: No  Therapist Response: Patient is able to verbalize an understanding of how thoughts, feelings, and actions contribute to overall anxiety and treatment. Heather Jefferson is able to identify coping mechanisms that have been helpful in the past and increase their use. Heather Jefferson works hard too identify, challenge, and replace fearful self taught with positive, realistic, and empowering self talk. Patient is continuing to take steps towards appropriately grieving her loss is in order to normalize her mood and return to adaptive levels of functioning. Patient is able to recall and talk about traumatic events without becoming overwhelmed with negative emotions. These behaviors are reflective of overall  progress and personal  growth. Treatment to continue as indicated  Plan: Return again in 4 weeks.  Diagnosis: Axis I: Post Traumatic Stress Disorder    Axis II: No diagnosis    Heather Bo [name], LCSW 03/22/2021

## 2021-03-23 ENCOUNTER — Other Ambulatory Visit
Admission: RE | Admit: 2021-03-23 | Discharge: 2021-03-23 | Disposition: A | Discharge status: Home / Self Care | Payer: Medicare Other | Source: Ambulatory Visit | Attending: Pulmonary Disease | Admitting: Pulmonary Disease

## 2021-03-23 DIAGNOSIS — R0689 Other abnormalities of breathing: Secondary | ICD-10-CM | POA: Insufficient documentation

## 2021-03-23 DIAGNOSIS — R0600 Dyspnea, unspecified: Secondary | ICD-10-CM | POA: Diagnosis not present

## 2021-03-23 DIAGNOSIS — R0609 Other forms of dyspnea: Secondary | ICD-10-CM | POA: Diagnosis not present

## 2021-03-23 LAB — D-DIMER, QUANTITATIVE: D-Dimer, Quant: <0.27 ug/mL-FEU (ref 0.00–0.50)

## 2021-05-04 ENCOUNTER — Other Ambulatory Visit: Payer: Self-pay

## 2021-05-04 ENCOUNTER — Ambulatory Visit (INDEPENDENT_AMBULATORY_CARE_PROVIDER_SITE_OTHER): Payer: Medicare Other | Admitting: Licensed Clinical Social Worker

## 2021-05-04 DIAGNOSIS — F4312 Post-traumatic stress disorder, chronic: Secondary | ICD-10-CM | POA: Diagnosis not present

## 2021-05-04 DIAGNOSIS — F3130 Bipolar disorder, current episode depressed, mild or moderate severity, unspecified: Secondary | ICD-10-CM

## 2021-05-04 NOTE — Progress Notes (Signed)
Virtual Visit via Video Note  I connected with Heather Jefferson on 05/04/21 at  2:00 PM EDT by a video enabled telemedicine application and verified that I am speaking with the correct person using two identifiers.  Location: Patient: home Provider: remote office [place], [place])   I discussed the limitations of evaluation and management by telemedicine and the availability of in person appointments. The patient expressed understanding and agreed to proceed.  I discussed the assessment and treatment plan with the patient. The patient was provided an opportunity to ask questions and all were answered. The patient agreed with the plan and demonstrated an understanding of the instructions.   The patient was advised to call back or seek an in-person evaluation if the symptoms worsen or if the condition fails to improve as anticipated.  I provided 30 minutes of non-face-to-face time during this encounter.   Heather Jefferson R Heather Rotundo, LCSW   THERAPIST PROGRESS NOTE  Session Time: 2-2:30p  Participation Level: Active  Behavioral Response: Neat and Well GroomedAlertAnxious  Type of Therapy: Individual Therapy  Treatment Goals addressed: Anxiety  Interventions: Supportive and Other: trauma focused  Summary: Heather Jefferson is a 50 y.o. female who presents with increasing distress over husband's recent stroke. Patient reports that the hospital is not allowing her children to go to the hospital, so she knows that her children have anxiety as well. Allowed patient safe space to explore and express thoughts and feelings associated with recent external stressors in life events. Explore patients' relationship with mother, and recent interactions that patient had with her mother. Patient reports that she recently read an article about a female who gave a child up for adoption, and that was a triggering event for patient. Patient reports that she got very emotional and cried when she read the  article. Discussed trauma, trauma triggers, and how this is part of her processing her own emotions associated with her own trauma.Continued recommendations are as follows: self care behaviors, positive social engagements, focusing on overall work/home/life balance, and focusing on positive physical and emotional wellness.  .   Suicidal/Homicidal: No  Therapist Response: Patient is able to verbalize an understanding of how thoughts, feelings, and actions contribute to overall anxiety and treatment. Heather Jefferson is able to identify coping mechanisms that have been helpful in the past and increase their use. Heather Jefferson works hard too identify, challenge, and replace fearful self taught with positive, realistic, and empowering self talk. Patient is continuing to take steps towards appropriately grieving her loss is in order to normalize her mood and return to adaptive levels of functioning. Patient is able to recall and talk about traumatic events without becoming overwhelmed with negative emotions. These behaviors are reflective of overall progress and personal growth. Treatment to continue as indicated.  Plan: Return again in 4 weeks.  Diagnosis: Axis I: Post Traumatic Stress Disorder    Axis II: No diagnosis    Heather Bo Kalman Nylen, LCSW 05/04/2021
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
## 2021-05-09 ENCOUNTER — Telehealth (INDEPENDENT_AMBULATORY_CARE_PROVIDER_SITE_OTHER): Payer: Medicare Other | Admitting: Psychiatry

## 2021-05-09 ENCOUNTER — Encounter: Payer: Self-pay | Admitting: Psychiatry

## 2021-05-09 ENCOUNTER — Other Ambulatory Visit: Payer: Self-pay

## 2021-05-09 DIAGNOSIS — F39 Unspecified mood [affective] disorder: Secondary | ICD-10-CM

## 2021-05-09 DIAGNOSIS — F4312 Post-traumatic stress disorder, chronic: Secondary | ICD-10-CM | POA: Diagnosis not present

## 2021-05-09 NOTE — Patient Instructions (Signed)
Continue Pristiq 100 mg daily Continue bupropion 150 mg daily Continue Abilify 20 mg daily Continue temazepam 30 mg Obtain EKG  Next appointment: 10/11 at 2 PM

## 2021-05-12 ENCOUNTER — Telehealth: Payer: Self-pay

## 2021-05-12 NOTE — Telephone Encounter (Signed)
received a emial that a prior auth was needed for the desvenlafaxine '100mg'$ 
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
## 2021-05-23 ENCOUNTER — Other Ambulatory Visit: Payer: Self-pay

## 2021-05-23 ENCOUNTER — Encounter: Payer: Self-pay | Admitting: Family Medicine

## 2021-05-23 ENCOUNTER — Ambulatory Visit (INDEPENDENT_AMBULATORY_CARE_PROVIDER_SITE_OTHER): Payer: Medicare Other | Admitting: Family Medicine

## 2021-05-23 VITALS — BP 130/82 | HR 87 | Temp 98.3°F | Resp 16 | Ht 62.0 in | Wt 286.0 lb

## 2021-05-23 DIAGNOSIS — M797 Fibromyalgia: Secondary | ICD-10-CM

## 2021-05-23 DIAGNOSIS — R739 Hyperglycemia, unspecified: Secondary | ICD-10-CM

## 2021-05-23 DIAGNOSIS — F3160 Bipolar disorder, current episode mixed, unspecified: Secondary | ICD-10-CM

## 2021-05-23 DIAGNOSIS — M059 Rheumatoid arthritis with rheumatoid factor, unspecified: Secondary | ICD-10-CM

## 2021-05-23 DIAGNOSIS — G43009 Migraine without aura, not intractable, without status migrainosus: Secondary | ICD-10-CM | POA: Diagnosis not present

## 2021-05-23 DIAGNOSIS — E559 Vitamin D deficiency, unspecified: Secondary | ICD-10-CM

## 2021-05-23 DIAGNOSIS — E88819 Insulin resistance, unspecified: Secondary | ICD-10-CM

## 2021-05-23 DIAGNOSIS — N2581 Secondary hyperparathyroidism of renal origin: Secondary | ICD-10-CM

## 2021-05-23 DIAGNOSIS — E8881 Metabolic syndrome: Secondary | ICD-10-CM | POA: Diagnosis not present

## 2021-05-23 DIAGNOSIS — I10 Essential (primary) hypertension: Secondary | ICD-10-CM | POA: Diagnosis not present

## 2021-05-23 DIAGNOSIS — E785 Hyperlipidemia, unspecified: Secondary | ICD-10-CM | POA: Diagnosis not present

## 2021-05-23 DIAGNOSIS — E039 Hypothyroidism, unspecified: Secondary | ICD-10-CM | POA: Diagnosis not present

## 2021-05-23 DIAGNOSIS — E213 Hyperparathyroidism, unspecified: Secondary | ICD-10-CM | POA: Diagnosis not present

## 2021-05-23 DIAGNOSIS — Z23 Encounter for immunization: Secondary | ICD-10-CM

## 2021-05-23 MED ORDER — VITAMIN D (ERGOCALCIFEROL) 1.25 MG (50000 UNIT) PO CAPS: 50000.0000 [IU] | ORAL_CAPSULE | ORAL | 1 refills | Status: DC

## 2021-05-23 MED ORDER — SHINGRIX 50 MCG/0.5ML IM SUSR: 0.5000 mL | Freq: Once | INTRAMUSCULAR | 1 refills | Status: AC

## 2021-05-24 LAB — COMPLETE METABOLIC PANEL WITH GFR
AG Ratio: 1.3 (calc) (ref 1.0–2.5)
ALT: 13 U/L (ref 6–29)
AST: 13 U/L (ref 10–35)
Albumin: 4.2 g/dL (ref 3.6–5.1)
Alkaline phosphatase (APISO): 117 U/L (ref 37–153)
BUN: 8 mg/dL (ref 7–25)
CO2: 27 mmol/L (ref 20–32)
Calcium: 9.6 mg/dL (ref 8.6–10.4)
Chloride: 106 mmol/L (ref 98–110)
Creat: 0.84 mg/dL (ref 0.50–1.03)
Globulin: 3.2 g/dL (calc) (ref 1.9–3.7)
Glucose, Bld: 108 mg/dL — ABNORMAL HIGH (ref 65–99)
Potassium: 3.8 mmol/L (ref 3.5–5.3)
Sodium: 142 mmol/L (ref 135–146)
Total Bilirubin: 0.4 mg/dL (ref 0.2–1.2)
Total Protein: 7.4 g/dL (ref 6.1–8.1)
eGFR: 84 mL/min/{1.73_m2} (ref >=60)

## 2021-05-24 LAB — VITAMIN D 25 HYDROXY (VIT D DEFICIENCY, FRACTURES): Vit D, 25-Hydroxy: 27 ng/mL — ABNORMAL LOW (ref 30–100)

## 2021-05-24 LAB — CBC WITH DIFFERENTIAL/PLATELET
Absolute Monocytes: 502 cells/uL (ref 200–950)
Basophils Absolute: 32 cells/uL (ref 0–200)
Basophils Relative: 0.6 %
Eosinophils Absolute: 59 cells/uL (ref 15–500)
Eosinophils Relative: 1.1 %
HCT: 40.9 % (ref 35.0–45.0)
Hemoglobin: 13.5 g/dL (ref 11.7–15.5)
Lymphs Abs: 1912 cells/uL (ref 850–3900)
MCH: 28.9 pg (ref 27.0–33.0)
MCHC: 33 g/dL (ref 32.0–36.0)
MCV: 87.6 fL (ref 80.0–100.0)
MPV: 10.9 fL (ref 7.5–12.5)
Monocytes Relative: 9.3 %
Neutro Abs: 2894 cells/uL (ref 1500–7800)
Neutrophils Relative %: 53.6 %
Platelets: 268 10*3/uL (ref 140–400)
RBC: 4.67 10*6/uL (ref 3.80–5.10)
RDW: 13.4 % (ref 11.0–15.0)
Total Lymphocyte: 35.4 %
WBC: 5.4 10*3/uL (ref 3.8–10.8)

## 2021-05-24 LAB — LIPID PANEL
Cholesterol: 220 mg/dL — ABNORMAL HIGH (ref <200)
HDL: 58 mg/dL (ref >=50)
LDL Cholesterol (Calc): 137 mg/dL (calc) — ABNORMAL HIGH
Non-HDL Cholesterol (Calc): 162 mg/dL (calc) — ABNORMAL HIGH (ref <130)
Total CHOL/HDL Ratio: 3.8 (calc) (ref <5.0)
Triglycerides: 127 mg/dL (ref <150)

## 2021-05-24 LAB — HEMOGLOBIN A1C
Hgb A1c MFr Bld: 5.5 % of total Hgb (ref <5.7)
Mean Plasma Glucose: 111 mg/dL
eAG (mmol/L): 6.2 mmol/L

## 2021-05-24 LAB — TSH: TSH: 2.96 mIU/L

## 2021-05-24 LAB — PARATHYROID HORMONE, INTACT (NO CA): PTH: 141 pg/mL — ABNORMAL HIGH (ref 16–77)

## 2021-05-25 ENCOUNTER — Other Ambulatory Visit: Payer: Self-pay

## 2021-05-25 ENCOUNTER — Other Ambulatory Visit: Payer: Self-pay | Admitting: Family Medicine

## 2021-05-25 DIAGNOSIS — E039 Hypothyroidism, unspecified: Secondary | ICD-10-CM

## 2021-05-25 DIAGNOSIS — E213 Hyperparathyroidism, unspecified: Secondary | ICD-10-CM

## 2021-05-25 DIAGNOSIS — M059 Rheumatoid arthritis with rheumatoid factor, unspecified: Secondary | ICD-10-CM

## 2021-05-25 DIAGNOSIS — N2581 Secondary hyperparathyroidism of renal origin: Secondary | ICD-10-CM

## 2021-06-06 ENCOUNTER — Other Ambulatory Visit: Payer: Self-pay

## 2021-06-06 ENCOUNTER — Ambulatory Visit (INDEPENDENT_AMBULATORY_CARE_PROVIDER_SITE_OTHER): Payer: Medicare Other | Admitting: Licensed Clinical Social Worker

## 2021-06-06 DIAGNOSIS — F4312 Post-traumatic stress disorder, chronic: Secondary | ICD-10-CM | POA: Diagnosis not present
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
## 2021-06-07 NOTE — Plan of Care (Signed)
Developed plan

## 2021-06-07 NOTE — Plan of Care (Signed)
  Problem: PTSD-Trauma Disorder CCP Problem  1 Reduce the negative impact trauma related symptoms have on social, occupational, and family functioning. Goal: LTG: Reduce frequency, intensity, and duration of PTSD symptoms so daily functioning is improved: Input needed on appropriate metric.  pt self report Outcome: Progressing Goal: STG: '@PREFFIRSTNAME'$ @ will practice emotion regulation skills 7 per week for the next 16 weeks Outcome: Progressing Intervention: Work with '@PREFFIRSTNAME'$ @ to complete a Trauma Interview in an individual session

## 2021-06-20 DIAGNOSIS — Z01818 Encounter for other preprocedural examination: Secondary | ICD-10-CM | POA: Diagnosis not present

## 2021-06-21 DIAGNOSIS — Z01818 Encounter for other preprocedural examination: Secondary | ICD-10-CM | POA: Diagnosis not present

## 2021-06-21 DIAGNOSIS — R0600 Dyspnea, unspecified: Secondary | ICD-10-CM | POA: Diagnosis not present

## 2021-06-21 DIAGNOSIS — R0689 Other abnormalities of breathing: Secondary | ICD-10-CM | POA: Diagnosis not present

## 2021-06-24 ENCOUNTER — Other Ambulatory Visit: Payer: Self-pay | Admitting: Family Medicine

## 2021-06-24 DIAGNOSIS — M797 Fibromyalgia: Secondary | ICD-10-CM

## 2021-06-24 NOTE — Telephone Encounter (Signed)
Requested medication (s) are due for refill today: no  Requested medication (s) are on the active medication list: no  Last refill:  01/25/20 #270 1 RF  Future visit scheduled: yes  Notes to clinic:  dc'd 11/17/20- NT not delegated to RF or refuse this med   Requested Prescriptions  Pending Prescriptions Disp Refills   pregabalin (LYRICA) 50 MG capsule [Pharmacy Med Name: Pregabalin 50 MG Oral Capsule] 90 capsule 0    Sig: TAKE 1 CAPSULE BY MOUTH THREE TIMES DAILY     Not Delegated - Neurology:  Anticonvulsants - Controlled Failed - 06/24/2021  1:41 PM      Failed - This refill cannot be delegated      Passed - Valid encounter within last 12 months    Recent Outpatient Visits           1 month ago Vitamin D deficiency   [facility] [place], Drue Stager, MD   4 months ago Rheumatoid arthritis with positive rheumatoid factor, involving unspecified site [facility])   [facility] Steele Sizer, MD   7 months ago Benign essential HTN   [facility] [place], Drue Stager, MD   10 months ago Rheumatoid arthritis with positive rheumatoid factor, involving unspecified site [facility])   [facility] Steele Sizer, MD   1 year ago Morbid obesity [facility])   [facility] Steele Sizer, MD       Future Appointments             In 5 months Ancil Boozer, Drue Stager, MD [facility], [facility]   In 5 months  [facility], [facility]

## 2021-06-26 ENCOUNTER — Other Ambulatory Visit (HOSPITAL_COMMUNITY): Payer: Self-pay | Admitting: Psychiatry

## 2021-06-26 DIAGNOSIS — F3130 Bipolar disorder, current episode depressed, mild or moderate severity, unspecified: Secondary | ICD-10-CM

## 2021-06-27 ENCOUNTER — Other Ambulatory Visit (HOSPITAL_COMMUNITY): Payer: Self-pay | Admitting: Psychiatry

## 2021-06-27 DIAGNOSIS — F3130 Bipolar disorder, current episode depressed, mild or moderate severity, unspecified: Secondary | ICD-10-CM

## 2021-06-27 DIAGNOSIS — F4312 Post-traumatic stress disorder, chronic: Secondary | ICD-10-CM
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
## 2021-07-04 ENCOUNTER — Other Ambulatory Visit: Payer: Self-pay

## 2021-07-04 ENCOUNTER — Other Ambulatory Visit
Admission: RE | Admit: 2021-07-04 | Discharge: 2021-07-04 | Disposition: A | Discharge status: Home / Self Care | Payer: Medicare Other | Source: Ambulatory Visit | Attending: Pulmonary Disease | Admitting: Pulmonary Disease

## 2021-07-04 ENCOUNTER — Telehealth (INDEPENDENT_AMBULATORY_CARE_PROVIDER_SITE_OTHER): Payer: Medicare Other | Admitting: Psychiatry

## 2021-07-04 ENCOUNTER — Encounter: Payer: Self-pay | Admitting: Psychiatry

## 2021-07-04 DIAGNOSIS — F39 Unspecified mood [affective] disorder: Secondary | ICD-10-CM

## 2021-07-04 DIAGNOSIS — Z20822 Contact with and (suspected) exposure to covid-19: Secondary | ICD-10-CM | POA: Diagnosis not present

## 2021-07-04 DIAGNOSIS — Z01812 Encounter for preprocedural laboratory examination: Secondary | ICD-10-CM | POA: Insufficient documentation

## 2021-07-04 DIAGNOSIS — F4312 Post-traumatic stress disorder, chronic: Secondary | ICD-10-CM

## 2021-07-04 MED ORDER — DESVENLAFAXINE SUCCINATE ER 100 MG PO TB24: 100.0000 mg | ORAL_TABLET | Freq: Every day | ORAL | 2 refills | Status: DC

## 2021-07-04 MED ORDER — ARIPIPRAZOLE 20 MG PO TABS: 20.0000 mg | ORAL_TABLET | Freq: Every day | ORAL | 2 refills | Status: DC

## 2021-07-04 MED ORDER — BUPROPION HCL ER (XL) 150 MG PO TB24: 150.0000 mg | ORAL_TABLET | Freq: Every morning | ORAL | 2 refills | Status: DC

## 2021-07-04 MED ORDER — TEMAZEPAM 30 MG PO CAPS: 30.0000 mg | ORAL_CAPSULE | Freq: Every evening | ORAL | 2 refills | Status: DC | PRN

## 2021-07-04 MED ORDER — TOPIRAMATE 100 MG PO TABS: 100.0000 mg | ORAL_TABLET | Freq: Every day | ORAL | 0 refills | Status: DC

## 2021-07-05 LAB — SARS CORONAVIRUS 2 (TAT 6-24 HRS): SARS Coronavirus 2: NEGATIVE

## 2021-07-06 ENCOUNTER — Ambulatory Visit: Payer: Medicare Other | Attending: Internal Medicine

## 2021-07-06 DIAGNOSIS — G4733 Obstructive sleep apnea (adult) (pediatric): Secondary | ICD-10-CM | POA: Insufficient documentation

## 2021-07-07 ENCOUNTER — Other Ambulatory Visit: Payer: Self-pay

## 2021-07-18 ENCOUNTER — Other Ambulatory Visit: Payer: Self-pay

## 2021-07-18 ENCOUNTER — Ambulatory Visit (INDEPENDENT_AMBULATORY_CARE_PROVIDER_SITE_OTHER): Payer: Medicare Other | Admitting: Licensed Clinical Social Worker

## 2021-07-18 DIAGNOSIS — F4312 Post-traumatic stress disorder, chronic: Secondary | ICD-10-CM | POA: Diagnosis not present
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
## 2021-07-20 DIAGNOSIS — G8929 Other chronic pain: Secondary | ICD-10-CM | POA: Diagnosis not present

## 2021-07-20 DIAGNOSIS — Z79899 Other long term (current) drug therapy: Secondary | ICD-10-CM | POA: Diagnosis not present

## 2021-07-20 DIAGNOSIS — M797 Fibromyalgia: Secondary | ICD-10-CM | POA: Diagnosis not present

## 2021-07-20 DIAGNOSIS — M059 Rheumatoid arthritis with rheumatoid factor, unspecified: Secondary | ICD-10-CM | POA: Diagnosis not present

## 2021-07-20 DIAGNOSIS — M25561 Pain in right knee: Secondary | ICD-10-CM | POA: Diagnosis not present

## 2021-08-14 ENCOUNTER — Encounter: Payer: Self-pay | Admitting: Internal Medicine

## 2021-08-14 ENCOUNTER — Telehealth (INDEPENDENT_AMBULATORY_CARE_PROVIDER_SITE_OTHER): Payer: Medicare Other | Admitting: Internal Medicine

## 2021-08-14 VITALS — Temp 97.1°F | Ht 62.0 in | Wt 286.0 lb

## 2021-08-14 DIAGNOSIS — J069 Acute upper respiratory infection, unspecified: Secondary | ICD-10-CM

## 2021-08-14 MED ORDER — BENZONATATE 100 MG PO CAPS: 100.0000 mg | ORAL_CAPSULE | Freq: Two times a day (BID) | ORAL | 0 refills | Status: DC | PRN

## 2021-08-14 MED ORDER — FLUTICASONE PROPIONATE 50 MCG/ACT NA SUSP: 2.0000 | Freq: Every day | NASAL | 6 refills | Status: DC

## 2021-08-14 NOTE — Progress Notes (Signed)
Virtual Visit via Telephone Note  I connected with Heather Jefferson on 08/14/21 at  8:40 AM EST by telephone and verified that I am speaking with the correct person using two identifiers.  Location: Patient: Home Provider: [facility]   I discussed the limitations, risks, security and privacy concerns of performing an evaluation and management service by telephone and the availability of in person appointments. I also discussed with the patient that there may be a patient responsible charge related to this service. The patient expressed understanding and agreed to proceed.   History of Present Illness:  Heather Jefferson is a 50 year old female presenting via telemedicine for URI like symptoms. Chronic medical conditions include HTN, migraine, hypothyroidism. Symtpoms started Friday with sore throat and progressed to productive cough (clear/green), chest tightness, headache on left side.   URI Compliant:  -Worst symptom: cough, chest tightness, sore throat -Fever: no -Cough: yes -Shortness of breath: yes -Wheezing: yes -Chest pain: yes, with cough -Chest tightness: yes -Chest congestion: yes -Nasal congestion: no -Runny nose: no -Post nasal drip: no -Sneezing: no -Sore throat: yes -Swollen glands: no -Sinus pressure: no -Headache: yes -Face pain: no -Ear pain: no bilateral -Ear pressure: no bilateral -Fatigue: yes -Sick contacts: yes; family had flu last week -Strep contacts: no  -Context: stable -Relief with OTC cold/cough medications: yes  -Treatments attempted: mucinex and cough syrup; using Advair for chest tightness which does help   Observations/Objective:  General: well appearing, no acute distress ENT: conjunctiva normal appearing bilaterally  Pulm: occasional cough Neuro: A&O  Assessment and Plan:  1. Viral URI with cough: She will present to clinic later today to be tested for both covid and flu. She is within the treatment window for antivirals. Meanwhile we  will treat symptomatically with tessalon Perles for cough, Flonase for post-nasal drip/sore throat and she will continue Mucinex and inhaler. Follow up as needed.  - fluticasone (FLONASE) 50 MCG/ACT nasal spray; Place 2 sprays into both nostrils daily.  Dispense: 16 g; Refill: 6 - benzonatate (TESSALON) 100 MG capsule; Take 1 capsule (100 mg total) by mouth 2 (two) times daily as needed for cough.  Dispense: 20 capsule; Refill: 0 - Novel Coronavirus, NAA ([facility]) - Influenza a and b  Follow Up Instructions: as needed if symptoms worsen or fail to improve    I discussed the assessment and treatment plan with the patient. The patient was provided an opportunity to ask questions and all were answered. The patient agreed with the plan and demonstrated an understanding of the instructions.   The patient was advised to call back or seek an in-person evaluation if the symptoms worsen or if the condition fails to improve as anticipated.  I provided 10 minutes of non-face-to-face time during this encounter.   [name], DO

## 2021-08-14 NOTE — Addendum Note (Signed)
Addended by: Docia Furl on: 08/14/2021 09:52 AM   Modules accepted: Orders

## 2021-08-17 LAB — SARS-COV-2, NAA 2 DAY TAT

## 2021-08-17 LAB — INFLUENZA A AND B

## 2021-08-17 LAB — NOVEL CORONAVIRUS, NAA: SARS-CoV-2, NAA: NOT DETECTED

## 2021-08-17 LAB — SPECIMEN STATUS REPORT

## 2021-08-18 LAB — FLU A+B CULT, RAPID
MICRO NUMBER:: 12667404
SPECIMEN QUALITY:: ADEQUATE

## 2021-08-24 ENCOUNTER — Ambulatory Visit (INDEPENDENT_AMBULATORY_CARE_PROVIDER_SITE_OTHER): Payer: Medicare Other | Admitting: Licensed Clinical Social Worker

## 2021-08-24 ENCOUNTER — Other Ambulatory Visit: Payer: Self-pay

## 2021-08-24 DIAGNOSIS — F4312 Post-traumatic stress disorder, chronic: Secondary | ICD-10-CM

## 2021-08-24 NOTE — Plan of Care (Signed)
  Problem: PTSD-Trauma Disorder CCP Problem  1 Reduce the negative impact trauma related symptoms have on social, occupational, and family functioning. Goal: LTG: Reduce frequency, intensity, and duration of PTSD symptoms so daily functioning is improved: Input needed on appropriate metric.  pt self report Outcome: Progressing Goal: STG: @PREFFIRSTNAME @ will practice emotion regulation skills 7 per week for the next 16 weeks Outcome: Progressing Intervention: Assist with relaxation techniques, as appropriate (deep breathing exercises, meditation, guided imagery) Intervention: Educate patient on: Stress management   Problem: Depression CCP Problem  1 Decrease depressive symptoms and improve levels of effective functioning Goal: LTG: Reduce frequency, intensity, and duration of depression symptoms as evidenced by: pt self report Outcome: Progressing Goal: STG: @PREFFIRSTNAME @ will participate in at least 80% of scheduled individual psychotherapy sessions Outcome: Progressing Intervention: Review PLEASE Skills (Treat Physical Illness, Balance Eating, Avoid Mood-Altering Substances, Balance Sleep and Get Exercise) with @PREFFIRSTNAME @ Intervention: Encourage patient to identify triggers Intervention: Provide grief and bereavement support

## 2021-08-24 NOTE — Progress Notes (Addendum)
Virtual Visit via Video Note  I connected with Heather Jefferson on 08/24/21 at 10:00 AM EST by a video enabled telemedicine application and verified that I am speaking with the correct person using two identifiers.  Location: Patient: home Provider: [facility]   I discussed the limitations of evaluation and management by telemedicine and the availability of in person appointments. The patient expressed understanding and agreed to proceed.  I discussed the assessment and treatment plan with the patient. The patient was provided an opportunity to ask questions and all were answered. The patient agreed with the plan and demonstrated an understanding of the instructions.   The patient was advised to call back or seek an in-person evaluation if the symptoms worsen or if the condition fails to improve as anticipated.  I provided 30 minutes of non-face-to-face time during this encounter.   Medard Decuir R Leatrice Parilla, LCSW   THERAPIST PROGRESS NOTE  Session Time: 10-1030a  Participation Level: Active  Behavioral Response: Neat and Well GroomedAlertDepressed  Type of Therapy: Individual Therapy  Treatment Goals addressed:  Problem: Depression CCP Problem  1 Decrease depressive symptoms and improve levels of effective functioning Goal: LTG: Reduce frequency, intensity, and duration of depression symptoms as evidenced by: pt self report Outcome: Progressing  Goal: STG: @PREFFIRSTNAME @ will participate in at least 80% of scheduled individual psychotherapy sessions Outcome: Progressing  Problem: PTSD-Trauma Disorder CCP Problem  1 Reduce the negative impact trauma related symptoms have on social, occupational, and family functioning. Goal: LTG: Reduce frequency, intensity, and duration of PTSD symptoms so daily functioning is improved: Input needed on appropriate metric.  pt self report Outcome: Progressing  Goal: STG: @PREFFIRSTNAME @ will practice emotion regulation skills 7 per week for the next  16 weeks Outcome: Progressing  Interventions:  Intervention: Review PLEASE Skills (Treat Physical Illness, Balance Eating, Avoid Mood-Altering Substances, Balance Sleep and Get Exercise) with @PREFFIRSTNAME @  Intervention: Encourage patient to identify triggers  Intervention: Provide grief and bereavement support  Intervention: Assist with relaxation techniques, as appropriate (deep breathing exercises, meditation, guided imagery)  Intervention: Educate patient on: Stress management  Summary: Heather Jefferson is a 50 y.o. female who presents with symptoms consistent with PTSD. Pt reports that mood has been stable. Pt reports that she is compliant with medication and is managing stress/anxiety well. Pt reports occasional depressed days but pt feels she is managing the depression well. Pt is still experiencing chronic pain from fibromyalgia--discussed overall psychological impact of dealing with this pain.  Allowed pt to explore and express thoughts and feelings associated with recent life situations and external stressors. Explored recent situation where pt was taking care of her mother after surgery. Pt reports that she spent a lot of time with her mother and that it went a lot better than pt initially expected. Pt reports that the holidays triggered feelings of loss for family members that she has lost. Encouraged pt to allow herself to acknowledge her grief as it is part of the grieving/healing process. Explored relationships with family members--pt feels overwhelmed at times that she is the primary driver for everyone in the family: daughter 1st shift (school), husband works 2nd shift, and son works 3rd shift.  Reviewed importance of self care to help balance out feelings of being overwhelmed. Reviewed stimulating activities to help when pt is feeling more depressed.  Continued recommendations are as follows: self care behaviors, positive social engagements, focusing on overall work/home/life  balance, and focusing on positive physical and emotional wellness.   Suicidal/Homicidal: No  Therapist Response:  Pt is continuing to apply interventions learned in session into daily life situations. Pt is currently on track to meet goals utilizing interventions mentioned above. Personal growth and progress noted. Treatment to continue as indicated.   Plan: Return again in 4 weeks.  Diagnosis: Axis I: Post Traumatic Stress Disorder    Axis II: No diagnosis    Rachel Bo Ezariah Nace, LCSW 08/24/2021

## 2021-09-06 ENCOUNTER — Other Ambulatory Visit: Admission: RE | Admit: 2021-09-06 | Payer: Medicare Other | Source: Ambulatory Visit

## 2021-09-20 ENCOUNTER — Other Ambulatory Visit: Payer: Self-pay | Admitting: Pulmonary Disease

## 2021-09-20 DIAGNOSIS — J849 Interstitial pulmonary disease, unspecified: Secondary | ICD-10-CM | POA: Diagnosis not present

## 2021-09-20 DIAGNOSIS — G4733 Obstructive sleep apnea (adult) (pediatric): Secondary | ICD-10-CM | POA: Diagnosis not present

## 2021-09-26 ENCOUNTER — Ambulatory Visit: Admission: RE | Admit: 2021-09-26 | Payer: Medicare Other | Source: Ambulatory Visit
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
## 2021-09-27 ENCOUNTER — Telehealth: Payer: Self-pay

## 2021-09-27 ENCOUNTER — Other Ambulatory Visit: Payer: Self-pay

## 2021-09-27 DIAGNOSIS — E213 Hyperparathyroidism, unspecified: Secondary | ICD-10-CM

## 2021-09-27 NOTE — Telephone Encounter (Signed)
New referral has been ordered, contacted pt to make sure she was okay with that and she stated yes as long as it was in [place]. Thank you for the help.

## 2021-09-27 NOTE — Progress Notes (Signed)
Pt was told to call PCP to get a new referral put in due to the one she was already assigned had no opening for new patients.

## 2021-09-27 NOTE — Telephone Encounter (Signed)
Copied from [place] [id number]. Topic: Referral - Request for Referral >> Sep 27, 2021 11:47 AM McGill, Nelva Bush wrote: Pt stated PCP sent referral for Endocrinology to [facility] clinic pt stated [facility] clinic just contacted her to cancel her appointment pt was advised she they did not have the staff to accept new patients and they had to cancel. Pt requesting new referral in [place] if possible.

## 2021-09-27 NOTE — Telephone Encounter (Signed)
Could you refer her to another place in [place]? Thank you!

## 2021-09-28 ENCOUNTER — Encounter: Payer: Self-pay | Admitting: Psychiatry

## 2021-09-28 ENCOUNTER — Telehealth (INDEPENDENT_AMBULATORY_CARE_PROVIDER_SITE_OTHER): Payer: Medicare Other | Admitting: Psychiatry

## 2021-09-28 ENCOUNTER — Other Ambulatory Visit: Payer: Self-pay

## 2021-09-28 DIAGNOSIS — F4312 Post-traumatic stress disorder, chronic: Secondary | ICD-10-CM

## 2021-09-28 DIAGNOSIS — G4700 Insomnia, unspecified: Secondary | ICD-10-CM

## 2021-09-28 DIAGNOSIS — F39 Unspecified mood [affective] disorder: Secondary | ICD-10-CM | POA: Diagnosis not present

## 2021-09-28 MED ORDER — BUPROPION HCL ER (XL) 150 MG PO TB24: 150.0000 mg | ORAL_TABLET | Freq: Every morning | ORAL | 2 refills | Status: DC

## 2021-09-28 MED ORDER — DESVENLAFAXINE SUCCINATE ER 100 MG PO TB24: 100.0000 mg | ORAL_TABLET | Freq: Every day | ORAL | 2 refills | Status: DC

## 2021-09-28 MED ORDER — ARIPIPRAZOLE 20 MG PO TABS: 20.0000 mg | ORAL_TABLET | Freq: Every day | ORAL | 2 refills | Status: DC

## 2021-09-28 MED ORDER — PRAZOSIN HCL 2 MG PO CAPS: 2.0000 mg | ORAL_CAPSULE | Freq: Every day | ORAL | 1 refills | Status: DC

## 2021-09-28 MED ORDER — TEMAZEPAM 30 MG PO CAPS: 30.0000 mg | ORAL_CAPSULE | Freq: Every evening | ORAL | 2 refills | Status: DC | PRN

## 2021-09-28 MED ORDER — PRAZOSIN HCL 1 MG PO CAPS: 1.0000 mg | ORAL_CAPSULE | Freq: Every day | ORAL | 0 refills | Status: DC

## 2021-09-28 NOTE — Patient Instructions (Addendum)
Continue Pristiq 100 mg daily Continue bupropion 150 mg daily Continue Abilify 20 mg daily Start prazosin 1 mg at night for 3 days, then 2 mg at night Continue temazepam 30 mg at night as needed for insomnia.  Obtain EKG (call [phone] for further guidance) Next appointment: 2/20 at 8:30

## 2021-09-28 NOTE — Telephone Encounter (Signed)
Great, Thank you so much!!

## 2021-10-04 ENCOUNTER — Encounter: Payer: Self-pay | Admitting: Internal Medicine

## 2021-10-04 ENCOUNTER — Other Ambulatory Visit: Payer: Self-pay

## 2021-10-04 ENCOUNTER — Ambulatory Visit
Admission: RE | Admit: 2021-10-04 | Discharge: 2021-10-04 | Disposition: A | Discharge status: Home / Self Care | Payer: Medicare Other | Source: Ambulatory Visit | Attending: Psychiatry | Admitting: Psychiatry

## 2021-10-04 DIAGNOSIS — R9431 Abnormal electrocardiogram [ECG] [EKG]: Secondary | ICD-10-CM | POA: Diagnosis not present

## 2021-10-04 DIAGNOSIS — F39 Unspecified mood [affective] disorder: Secondary | ICD-10-CM | POA: Insufficient documentation

## 2021-10-04 DIAGNOSIS — F068 Other specified mental disorders due to known physiological condition: Secondary | ICD-10-CM | POA: Diagnosis not present

## 2021-10-13 ENCOUNTER — Ambulatory Visit: Payer: Medicare Other | Attending: Internal Medicine

## 2021-10-13 ENCOUNTER — Ambulatory Visit (INDEPENDENT_AMBULATORY_CARE_PROVIDER_SITE_OTHER): Payer: Medicare Other | Admitting: Licensed Clinical Social Worker

## 2021-10-13 ENCOUNTER — Encounter: Payer: Self-pay | Admitting: Internal Medicine

## 2021-10-13 ENCOUNTER — Other Ambulatory Visit: Payer: Self-pay

## 2021-10-13 DIAGNOSIS — F4312 Post-traumatic stress disorder, chronic: Secondary | ICD-10-CM | POA: Diagnosis not present

## 2021-10-13 DIAGNOSIS — G4733 Obstructive sleep apnea (adult) (pediatric): Secondary | ICD-10-CM | POA: Diagnosis not present

## 2021-10-13 NOTE — Plan of Care (Signed)
°  Problem: PTSD-Trauma Disorder CCP Problem  1 Reduce the negative impact trauma related symptoms have on social, occupational, and family functioning. Goal: LTG: Reduce frequency, intensity, and duration of PTSD symptoms so daily functioning is improved: Input needed on appropriate metric.  pt self report Outcome: Progressing Goal: STG: @PREFFIRSTNAME @ will practice emotion regulation skills 7 per week for the next 16 weeks Outcome: Progressing
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
## 2021-10-16 ENCOUNTER — Other Ambulatory Visit: Payer: Self-pay

## 2021-10-16 NOTE — Plan of Care (Signed)
°  Problem: PTSD-Trauma Disorder CCP Problem  1 Reduce the negative impact trauma related symptoms have on social, occupational, and family functioning. Goal: LTG: Reduce frequency, intensity, and duration of PTSD symptoms so daily functioning is improved: Input needed on appropriate metric.  pt self report Outcome: Progressing Goal: STG: @PREFFIRSTNAME @ will practice emotion regulation skills 7 per week for the next 16 weeks Outcome: Progressing   Problem: Depression CCP Problem  1 Decrease depressive symptoms and improve levels of effective functioning Goal: LTG: Reduce frequency, intensity, and duration of depression symptoms as evidenced by: pt self report Outcome: Progressing Goal: STG: @PREFFIRSTNAME @ will participate in at least 80% of scheduled individual psychotherapy sessions Outcome: Progressing
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
## 2021-11-13 ENCOUNTER — Ambulatory Visit: Payer: Medicare Other | Admitting: Psychiatry
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
## 2021-11-14 ENCOUNTER — Other Ambulatory Visit: Payer: Self-pay

## 2021-11-14 ENCOUNTER — Ambulatory Visit (INDEPENDENT_AMBULATORY_CARE_PROVIDER_SITE_OTHER): Payer: Medicare Other | Admitting: Psychiatry

## 2021-11-14 ENCOUNTER — Encounter: Payer: Self-pay | Admitting: Psychiatry

## 2021-11-14 VITALS — BP 163/99 | HR 82 | Temp 97.6°F | Wt 290.2 lb

## 2021-11-14 DIAGNOSIS — F4312 Post-traumatic stress disorder, chronic: Secondary | ICD-10-CM

## 2021-11-14 DIAGNOSIS — F39 Unspecified mood [affective] disorder: Secondary | ICD-10-CM

## 2021-11-14 DIAGNOSIS — G4700 Insomnia, unspecified: Secondary | ICD-10-CM | POA: Diagnosis not present

## 2021-11-14 NOTE — Patient Instructions (Signed)
Continue Pristiq 100 mg daily Continue bupropion 150 mg daily Continue Abilify 20 mg daily  Continue prazosin 2 mg at night Continue temazepam 30 mg at night as needed for insomnia.  Next appointment: 3/31 at 10 AM,

## 2021-11-21 ENCOUNTER — Ambulatory Visit: Payer: Medicare Other | Admitting: Family Medicine

## 2021-11-27 ENCOUNTER — Other Ambulatory Visit: Payer: Self-pay | Admitting: Family Medicine

## 2021-11-27 DIAGNOSIS — Z1231 Encounter for screening mammogram for malignant neoplasm of breast: Secondary | ICD-10-CM

## 2021-11-29 DIAGNOSIS — M069 Rheumatoid arthritis, unspecified: Secondary | ICD-10-CM | POA: Diagnosis not present

## 2021-11-29 DIAGNOSIS — E213 Hyperparathyroidism, unspecified: Secondary | ICD-10-CM | POA: Diagnosis not present

## 2021-11-29 DIAGNOSIS — Z6841 Body Mass Index (BMI) 40.0 and over, adult: Secondary | ICD-10-CM | POA: Diagnosis not present

## 2021-11-29 DIAGNOSIS — E6609 Other obesity due to excess calories: Secondary | ICD-10-CM | POA: Diagnosis not present

## 2021-11-29 DIAGNOSIS — I10 Essential (primary) hypertension: Secondary | ICD-10-CM | POA: Diagnosis not present

## 2021-11-29 DIAGNOSIS — E039 Hypothyroidism, unspecified: Secondary | ICD-10-CM | POA: Diagnosis not present

## 2021-11-29 LAB — LIPID PANEL
Cholesterol: 224 — AB (ref 0–200)
HDL: 61 (ref 35–70)
LDL Cholesterol: 141
Triglycerides: 123 (ref 40–160)

## 2021-11-29 LAB — TSH: TSH: 2.8 (ref <=5.90)

## 2021-11-29 LAB — HEPATIC FUNCTION PANEL: Alkaline Phosphatase: 149 — AB (ref 25–125)

## 2021-11-29 LAB — COMPREHENSIVE METABOLIC PANEL
Albumin: 4.6 (ref 3.5–5.0)
Calcium: 9.7 (ref 8.7–10.7)
Globulin: 3.1

## 2021-11-30 ENCOUNTER — Encounter: Payer: Self-pay | Admitting: Internal Medicine

## 2021-12-02 ENCOUNTER — Other Ambulatory Visit: Payer: Self-pay | Admitting: Psychiatry

## 2021-12-05 ENCOUNTER — Encounter: Payer: Self-pay | Admitting: Internal Medicine

## 2021-12-05 ENCOUNTER — Ambulatory Visit (INDEPENDENT_AMBULATORY_CARE_PROVIDER_SITE_OTHER): Payer: Medicare HMO | Admitting: Licensed Clinical Social Worker

## 2021-12-05 ENCOUNTER — Other Ambulatory Visit: Payer: Self-pay

## 2021-12-05 DIAGNOSIS — F4312 Post-traumatic stress disorder, chronic: Secondary | ICD-10-CM | POA: Diagnosis not present
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
## 2021-12-05 NOTE — Plan of Care (Signed)
?  Problem: PTSD-Trauma Disorder CCP Problem  1 Reduce the negative impact trauma related symptoms have on social, occupational, and family functioning. ?Goal: LTG: Reduce frequency, intensity, and duration of PTSD symptoms so daily functioning is improved: Input needed on appropriate metric.  pt self report ?Outcome: Not Progressing ?Goal: STG: '@PREFFIRSTNAME'$ @ will practice emotion regulation skills 7 per week for the next 16 weeks ?Outcome: Progressing ?  ?Problem: Depression CCP Problem  1 Decrease depressive symptoms and improve levels of effective functioning ?Goal: LTG: Reduce frequency, intensity, and duration of depression symptoms as evidenced by: pt self report ?Outcome: Progressing ?Goal: STG: '@PREFFIRSTNAME'$ @ will participate in at least 80% of scheduled individual psychotherapy sessions ?Outcome: Progressing ?  ?

## 2021-12-07 ENCOUNTER — Ambulatory Visit (INDEPENDENT_AMBULATORY_CARE_PROVIDER_SITE_OTHER): Payer: Medicare HMO

## 2021-12-07 DIAGNOSIS — Z0000 Encounter for general adult medical examination without abnormal findings: Secondary | ICD-10-CM | POA: Diagnosis not present

## 2021-12-07 NOTE — Patient Instructions (Signed)
Heather Jefferson , ?Thank you for taking time to come for your Medicare Wellness Visit. I appreciate your ongoing commitment to your health goals. Please review the following plan we discussed and let me know if I can assist you in the future.  ? ?Screening recommendations/referrals: ?Colonoscopy: done 01/29/20. Repeat 01/2025 ?Mammogram: done 10/08/18 ?Bone Density: due age 51 ?Recommended yearly ophthalmology/optometry visit for glaucoma screening and checkup ?Recommended yearly dental visit for hygiene and checkup ? ?Vaccinations: ?Influenza vaccine: done 05/23/21 ?Pneumococcal vaccine: done 05/25/13 ?Tdap vaccine: done 09/30/18 ?Shingles vaccine: please bring a copy of your vaccine record to your next appt  ?Covid-19: done 12/10/19 & 12/31/19 ? ?Advanced directives: Advance directive discussed with you today. I have provided a copy for you to complete at home and have notarized. Once this is complete please bring a copy in to our office so we can scan it into your chart.  ? ?Conditions/risks identified: Recommend increasing physical activity  ? ?Next appointment: Follow up in one year for your annual wellness visit.  ? ?Preventive Care 40-64 Years, Female ?Preventive care refers to lifestyle choices and visits with your health care provider that can promote health and wellness. ?What does preventive care include? ?A yearly physical exam. This is also called an annual well check. ?Dental exams once or twice a year. ?Routine eye exams. Ask your health care provider how often you should have your eyes checked. ?Personal lifestyle choices, including: ?Daily care of your teeth and gums. ?Regular physical activity. ?Eating a healthy diet. ?Avoiding tobacco and drug use. ?Limiting alcohol use. ?Practicing safe sex. ?Taking low-dose aspirin daily starting at age 41. ?Taking vitamin and mineral supplements as recommended by your health care provider. ?What happens during an annual well check? ?The services and screenings done by your  health care provider during your annual well check will depend on your age, overall health, lifestyle risk factors, and family history of disease. ?Counseling  ?Your health care provider may ask you questions about your: ?Alcohol use. ?Tobacco use. ?Drug use. ?Emotional well-being. ?Home and relationship well-being. ?Sexual activity. ?Eating habits. ?Work and work Statistician. ?Method of birth control. ?Menstrual cycle. ?Pregnancy history. ?Screening  ?You may have the following tests or measurements: ?Height, weight, and BMI. ?Blood pressure. ?Lipid and cholesterol levels. These may be checked every 5 years, or more frequently if you are over 14 years old. ?Skin check. ?Lung cancer screening. You may have this screening every year starting at age 68 if you have a 30-pack-year history of smoking and currently smoke or have quit within the past 15 years. ?Fecal occult blood test (FOBT) of the stool. You may have this test every year starting at age 38. ?Flexible sigmoidoscopy or colonoscopy. You may have a sigmoidoscopy every 5 years or a colonoscopy every 10 years starting at age 9. ?Hepatitis C blood test. ?Hepatitis B blood test. ?Sexually transmitted disease (STD) testing. ?Diabetes screening. This is done by checking your blood sugar (glucose) after you have not eaten for a while (fasting). You may have this done every 1-3 years. ?Mammogram. This may be done every 1-2 years. Talk to your health care provider about when you should start having regular mammograms. This may depend on whether you have a family history of breast cancer. ?BRCA-related cancer screening. This may be done if you have a family history of breast, ovarian, tubal, or peritoneal cancers. ?Pelvic exam and Pap test. This may be done every 3 years starting at age 8. Starting at age 39, this 55  be done every 5 years if you have a Pap test in combination with an HPV test. ?Bone density scan. This is done to screen for osteoporosis. You may have  this scan if you are at high risk for osteoporosis. ?Discuss your test results, treatment options, and if necessary, the need for more tests with your health care provider. ?Vaccines  ?Your health care provider may recommend certain vaccines, such as: ?Influenza vaccine. This is recommended every year. ?Tetanus, diphtheria, and acellular pertussis (Tdap, Td) vaccine. You may need a Td booster every 10 years. ?Zoster vaccine. You may need this after age 52. ?Pneumococcal 13-valent conjugate (PCV13) vaccine. You may need this if you have certain conditions and were not previously vaccinated. ?Pneumococcal polysaccharide (PPSV23) vaccine. You may need one or two doses if you smoke cigarettes or if you have certain conditions. ?Talk to your health care provider about which screenings and vaccines you need and how often you need them. ?This information is not intended to replace advice given to you by your health care provider. Make sure you discuss any questions you have with your health care provider. ?Document Released: 10/07/2015 Document Revised: 05/30/2016 Document Reviewed: 07/12/2015 ?Elsevier Interactive Patient Education ? 2017 [employer] ? ? ? ?Fall Prevention in the Home ?Falls can cause injuries. They can happen to people of all ages. There are many things you can do to make your home safe and to help prevent falls. ?What can I do on the outside of my home? ?Regularly fix the edges of walkways and driveways and fix any cracks. ?Remove anything that might make you trip as you walk through a door, such as a raised step or threshold. ?Trim any bushes or trees on the path to your home. ?Use bright outdoor lighting. ?Clear any walking paths of anything that might make someone trip, such as rocks or tools. ?Regularly check to see if handrails are loose or broken. Make sure that both sides of any steps have handrails. ?Any raised decks and porches should have guardrails on the edges. ?Have any leaves, snow, or  ice cleared regularly. ?Use sand or salt on walking paths during winter. ?Clean up any spills in your garage right away. This includes oil or grease spills. ?What can I do in the bathroom? ?Use night lights. ?Install grab bars by the toilet and in the tub and shower. Do not use towel bars as grab bars. ?Use non-skid mats or decals in the tub or shower. ?If you need to sit down in the shower, use a plastic, non-slip stool. ?Keep the floor dry. Clean up any water that spills on the floor as soon as it happens. ?Remove soap buildup in the tub or shower regularly. ?Attach bath mats securely with double-sided non-slip rug tape. ?Do not have throw rugs and other things on the floor that can make you trip. ?What can I do in the bedroom? ?Use night lights. ?Make sure that you have a light by your bed that is easy to reach. ?Do not use any sheets or blankets that are too big for your bed. They should not hang down onto the floor. ?Have a firm chair that has side arms. You can use this for support while you get dressed. ?Do not have throw rugs and other things on the floor that can make you trip. ?What can I do in the kitchen? ?Clean up any spills right away. ?Avoid walking on wet floors. ?Keep items that you use a lot in easy-to-reach places. ?If  you need to reach something above you, use a strong step stool that has a grab bar. ?Keep electrical cords out of the way. ?Do not use floor polish or wax that makes floors slippery. If you must use wax, use non-skid floor wax. ?Do not have throw rugs and other things on the floor that can make you trip. ?What can I do with my stairs? ?Do not leave any items on the stairs. ?Make sure that there are handrails on both sides of the stairs and use them. Fix handrails that are broken or loose. Make sure that handrails are as long as the stairways. ?Check any carpeting to make sure that it is firmly attached to the stairs. Fix any carpet that is loose or worn. ?Avoid having throw rugs at  the top or bottom of the stairs. If you do have throw rugs, attach them to the floor with carpet tape. ?Make sure that you have a light switch at the top of the stairs and the bottom of the stairs. I

## 2021-12-07 NOTE — Progress Notes (Signed)
? ?Subjective:  ? Heather Jefferson is a 51 y.o. female who presents for Medicare Annual (Subsequent) preventive examination. ? ?Virtual Visit via Telephone Note ? ?I connected with  Heather Jefferson on 12/07/21 at  8:00 AM EDT by telephone and verified that I am speaking with the correct person using two identifiers. ? ?Location: ?Patient: home ?Provider: [facility] ?Persons participating in the virtual visit: patient/Nurse Health Advisor ?  ?I discussed the limitations, risks, security and privacy concerns of performing an evaluation and management service by telephone and the availability of in person appointments. The patient expressed understanding and agreed to proceed. ? ?Interactive audio and video telecommunications were attempted between this nurse and patient, however failed, due to patient having technical difficulties OR patient did not have access to video capability.  We continued and completed visit with audio only. ? ?Some vital signs may be absent or patient reported.  ? ?Clemetine Marker, LPN ? ? ?Review of Systems    ? ?Cardiac Risk Factors include: hypertension;obesity (BMI >30kg/m2);sedentary lifestyle ? ?   ?Objective:  ?  ?There were no vitals filed for this visit. ?There is no height or weight on file to calculate BMI. ? ?Advanced Directives 12/07/2021 12/06/2020 01/29/2020 12/03/2019 11/28/2018 10/16/2018 04/04/2018  ?Does Patient Have a Medical Advance Directive? No No No No No No Yes  ?Does patient want to make changes to medical advance directive? - - - - - - No - Patient declined  ?Would patient like information on creating a medical advance directive? Yes (MAU/Ambulatory/Procedural Areas - Information given) Yes (MAU/Ambulatory/Procedural Areas - Information given) No - Patient declined Yes (MAU/Ambulatory/Procedural Areas - Information given) No - Patient declined No - Patient declined -  ?Some encounter information is confidential and restricted. Go to Review Flowsheets activity to see all data.   ? ? ?Current Medications (verified) ?Outpatient Encounter Medications as of 12/07/2021  ?Medication Sig  ? ADVAIR HFA 230-21 MCG/ACT inhaler Inhale 2 puffs into the lungs 2 (two) times daily.  ? amLODipine (NORVASC) 5 MG tablet Take 5 mg by mouth daily.  ? ARIPiprazole (ABILIFY) 20 MG tablet Take 1 tablet (20 mg total) by mouth daily.  ? buPROPion (WELLBUTRIN XL) 150 MG 24 hr tablet Take 1 tablet (150 mg total) by mouth every morning.  ? desvenlafaxine (PRISTIQ) 100 MG 24 hr tablet Take 1 tablet (100 mg total) by mouth daily.  ? hydroxychloroquine (PLAQUENIL) 200 MG tablet Take 1 tablet by mouth 2 (two) times daily.  ? levothyroxine (SYNTHROID) 25 MCG tablet Take 1 tablet (25 mcg total) by mouth daily before breakfast. One daily but two on Sundays ( the directions)  ? MIRENA, 52 MG, 20 MCG/24HR IUD   ? pantoprazole (PROTONIX) 40 MG tablet Take 40 mg by mouth 2 (two) times daily.  ? prazosin (MINIPRESS) 2 MG capsule Take 1 capsule (2 mg total) by mouth at bedtime.  ? prednisoLONE acetate (PRED FORTE) 1 % ophthalmic suspension Place 1 drop into the right eye daily.   ? SUMAtriptan (IMITREX) 20 MG/ACT nasal spray Place 1 spray (20 mg total) into the nose every 2 (two) hours as needed for migraine.  ? temazepam (RESTORIL) 30 MG capsule Take 1 capsule (30 mg total) by mouth at bedtime as needed for sleep.  ? Vitamin D, Ergocalciferol, (DRISDOL) 1.25 MG (50000 UNIT) CAPS capsule Take 1 capsule (50,000 Units total) by mouth every 7 (seven) days. (Patient not taking: Reported on 12/07/2021)  ? [DISCONTINUED] benzonatate (TESSALON) 100 MG capsule Take 1 capsule (  100 mg total) by mouth 2 (two) times daily as needed for cough.  ? [DISCONTINUED] fluticasone (FLONASE) 50 MCG/ACT nasal spray Place 2 sprays into both nostrils daily.  ? [DISCONTINUED] gabapentin (NEURONTIN) 300 MG capsule Take 300 mg by mouth daily.  ? [DISCONTINUED] hydroxychloroquine (PLAQUENIL) 200 MG tablet Take 200 mg by mouth 2 (two) times daily.  ?  [DISCONTINUED] Insulin Pen Needle 32G X 6 MM MISC 1 each by Does not apply route daily.  ? [DISCONTINUED] prazosin (MINIPRESS) 1 MG capsule Take 1 capsule (1 mg total) by mouth at bedtime for 3 days.  ? [DISCONTINUED] topiramate (TOPAMAX) 100 MG tablet Take 1 tablet (100 mg total) by mouth daily.  ? [DISCONTINUED] torsemide (DEMADEX) 5 MG tablet Take 5 mg by mouth daily.  ? ?No facility-administered encounter medications on file as of 12/07/2021.  ? ? ?Allergies (verified) ?Amoxicillin-pot clavulanate, Losartan, and Metformin and related  ? ?History: ?Past Medical History:  ?Diagnosis Date  ? ADHD (attention deficit hyperactivity disorder)   ? Allergy   ? Anemia   ? Anxiety   ? Depression   ? Diabetes mellitus, type II ([place])   ? Fibromyalgia   ? Generalized anxiety disorder   ? Headache   ? HIV infection ([place])   ? Hypertension   ? Hypothyroidism   ? Major depressive disorder, recurrent episode, moderate ([place])   ? Migraine with acute onset aura   ? Persistent disorder of initiating or maintaining sleep   ? PTSD (post-traumatic stress disorder)   ? Restless leg   ? Seizure disorder ([place])   ? Thyroid disease   ? Vitamin D deficiency   ? ?Past Surgical History:  ?Procedure Laterality Date  ? COLONOSCOPY WITH PROPOFOL N/A 01/29/2020  ? Procedure: COLONOSCOPY WITH PROPOFOL;  Surgeon: Jonathon Bellows, MD;  Location: [facility] ENDOSCOPY;  Service: Gastroenterology;  Laterality: N/A;  ? EYE SURGERY    ? TUBAL LIGATION    ? ?Family History  ?Problem Relation Age of Onset  ? Hypertension Mother   ? Heart attack Mother   ? CAD Mother   ? Diabetes Father   ? Hypertension Father   ? Hypertension Sister   ? Anxiety disorder Brother   ? Depression Brother   ? ?Social History  ? ?Socioeconomic History  ? Marital status: Married  ?  Spouse name: roberto  ? Number of children: 2  ? Years of education: Not on file  ? Highest education level: 10th grade  ?Occupational History  ? Occupation: disabled  ?Tobacco Use  ? Smoking status: Never  ?  Smokeless tobacco: Never  ?Vaping Use  ? Vaping Use: Never used  ?Substance and Sexual Activity  ? Alcohol use: Not Currently  ? Drug use: No  ? Sexual activity: Not Currently  ?  Partners: Male  ?  Birth control/protection: Other-see comments, I.U.D.  ?  Comment: husband has ED  ?Other Topics Concern  ? Not on file  ?Social History Narrative  ? 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 ?Social Determinants of Health  ? ?Financial Resource Strain: Low Risk   ? Difficulty of Paying Living Expenses: Not hard at all  ?Food Insecurity: No Food Insecurity  ? Worried About Charity fundraiser in the Last Year: Never true  ? Ran Out of Food in the Last Year: Never true  ?Transportation Needs: No Transportation Needs  ? Lack of Transportation (Medical): No  ?  Lack of Transportation (Non-Medical): No  ?Physical Activity: Inactive  ? Days of Exercise per Week: 0 days  ? Minutes of Exercise per Session: 0 min  ?Stress: Stress Concern Present  ? Feeling of Stress : To some extent  ?Social Connections: Moderately Integrated  ? Frequency of Communication with Friends and Family: More than three times a week  ? Frequency of Social Gatherings with Friends and Family: Once a week  ? Attends Religious Services: More than 4 times per year  ? Active Member of Clubs or Organizations: No  ? Attends Archivist Meetings: Never  ? Marital Status: Married  ? ? ?Tobacco Counseling ?Counseling given: Not Answered ? ? ?Clinical Intake: ? ?  ? ?  ? ?  ? ?  ? ?  ? ? ? ?  ? ?  ? ? ?Activities of Daily Living ?In your present state of health, do you have any difficulty performing the following activities: 12/07/2021 08/14/2021  ?Hearing? N N  ?Vision? N N  ?Difficulty concentrating or making decisions? N N  ?Walking or climbing stairs? N N  ?Dressing or bathing? N N  ?Doing errands, shopping? N N  ?Preparing Food and eating ? N -  ?Using the Toilet? N -  ?In the past six  months, have you accidently leaked urine? N -  ?Do you have problems with loss of bowel control? N -  ?Managing your Medications? N -  ?Managing your Finances? N -  ?Housekeeping or managing your Housekeeping? N -  ?Som

## 2021-12-13 DIAGNOSIS — F341 Dysthymic disorder: Secondary | ICD-10-CM | POA: Diagnosis not present

## 2021-12-13 DIAGNOSIS — E039 Hypothyroidism, unspecified: Secondary | ICD-10-CM | POA: Diagnosis not present

## 2021-12-13 DIAGNOSIS — E213 Hyperparathyroidism, unspecified: Secondary | ICD-10-CM | POA: Diagnosis not present

## 2021-12-13 DIAGNOSIS — E6609 Other obesity due to excess calories: Secondary | ICD-10-CM | POA: Diagnosis not present

## 2021-12-13 DIAGNOSIS — Z6841 Body Mass Index (BMI) 40.0 and over, adult: Secondary | ICD-10-CM | POA: Diagnosis not present

## 2021-12-13 DIAGNOSIS — K2100 Gastro-esophageal reflux disease with esophagitis, without bleeding: Secondary | ICD-10-CM | POA: Diagnosis not present

## 2021-12-13 DIAGNOSIS — I10 Essential (primary) hypertension: Secondary | ICD-10-CM | POA: Diagnosis not present

## 2021-12-13 DIAGNOSIS — G4700 Insomnia, unspecified: Secondary | ICD-10-CM | POA: Diagnosis not present

## 2021-12-13 DIAGNOSIS — E7800 Pure hypercholesterolemia, unspecified: Secondary | ICD-10-CM | POA: Diagnosis not present

## 2021-12-13 DIAGNOSIS — M069 Rheumatoid arthritis, unspecified: Secondary | ICD-10-CM | POA: Diagnosis not present

## 2021-12-15 DIAGNOSIS — E213 Hyperparathyroidism, unspecified: Secondary | ICD-10-CM | POA: Diagnosis not present

## 2021-12-15 DIAGNOSIS — M069 Rheumatoid arthritis, unspecified: Secondary | ICD-10-CM | POA: Diagnosis not present
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
## 2021-12-18 ENCOUNTER — Encounter: Payer: Self-pay | Admitting: Family Medicine

## 2021-12-18 ENCOUNTER — Ambulatory Visit (INDEPENDENT_AMBULATORY_CARE_PROVIDER_SITE_OTHER): Payer: Medicare HMO | Admitting: Family Medicine

## 2021-12-18 VITALS — BP 132/84 | HR 94 | Resp 16 | Ht 62.0 in | Wt 289.0 lb

## 2021-12-18 DIAGNOSIS — N2581 Secondary hyperparathyroidism of renal origin: Secondary | ICD-10-CM | POA: Diagnosis not present

## 2021-12-18 DIAGNOSIS — Z23 Encounter for immunization: Secondary | ICD-10-CM

## 2021-12-18 DIAGNOSIS — I10 Essential (primary) hypertension: Secondary | ICD-10-CM

## 2021-12-18 DIAGNOSIS — M797 Fibromyalgia: Secondary | ICD-10-CM | POA: Diagnosis not present

## 2021-12-18 DIAGNOSIS — F3160 Bipolar disorder, current episode mixed, unspecified: Secondary | ICD-10-CM

## 2021-12-18 DIAGNOSIS — E039 Hypothyroidism, unspecified: Secondary | ICD-10-CM | POA: Diagnosis not present

## 2021-12-18 DIAGNOSIS — M059 Rheumatoid arthritis with rheumatoid factor, unspecified: Secondary | ICD-10-CM | POA: Diagnosis not present

## 2021-12-18 DIAGNOSIS — G43009 Migraine without aura, not intractable, without status migrainosus: Secondary | ICD-10-CM | POA: Diagnosis not present

## 2021-12-18 DIAGNOSIS — E559 Vitamin D deficiency, unspecified: Secondary | ICD-10-CM | POA: Diagnosis not present

## 2021-12-18 DIAGNOSIS — E8881 Metabolic syndrome: Secondary | ICD-10-CM

## 2021-12-18 DIAGNOSIS — E785 Hyperlipidemia, unspecified: Secondary | ICD-10-CM

## 2021-12-18 DIAGNOSIS — J4520 Mild intermittent asthma, uncomplicated: Secondary | ICD-10-CM

## 2021-12-18 MED ORDER — PREGABALIN 50 MG PO CAPS: 50.0000 mg | ORAL_CAPSULE | Freq: Every day | ORAL | 0 refills | Status: DC
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
## 2021-12-22 ENCOUNTER — Encounter: Payer: Self-pay | Admitting: Psychiatry

## 2021-12-22 ENCOUNTER — Telehealth (INDEPENDENT_AMBULATORY_CARE_PROVIDER_SITE_OTHER): Payer: Medicare HMO | Admitting: Psychiatry

## 2021-12-22 DIAGNOSIS — G4700 Insomnia, unspecified: Secondary | ICD-10-CM | POA: Diagnosis not present

## 2021-12-22 DIAGNOSIS — F330 Major depressive disorder, recurrent, mild: Secondary | ICD-10-CM | POA: Diagnosis not present

## 2021-12-22 DIAGNOSIS — F4312 Post-traumatic stress disorder, chronic: Secondary | ICD-10-CM | POA: Diagnosis not present

## 2021-12-22 MED ORDER — DESVENLAFAXINE SUCCINATE ER 100 MG PO TB24: 100.0000 mg | ORAL_TABLET | Freq: Every day | ORAL | 5 refills | Status: DC

## 2021-12-22 MED ORDER — PRAZOSIN HCL 2 MG PO CAPS: 2.0000 mg | ORAL_CAPSULE | Freq: Every day | ORAL | 2 refills | Status: DC

## 2021-12-22 MED ORDER — BUPROPION HCL ER (XL) 150 MG PO TB24: 150.0000 mg | ORAL_TABLET | Freq: Every morning | ORAL | 5 refills | Status: DC

## 2021-12-22 MED ORDER — TEMAZEPAM 30 MG PO CAPS: 30.0000 mg | ORAL_CAPSULE | Freq: Every evening | ORAL | 2 refills | Status: DC | PRN

## 2021-12-22 NOTE — Patient Instructions (Signed)
Continue Pristiq 100 mg daily ?Continue bupropion 150 mg daily  ?Continue Abilify 20 mg daily ?Continue prazosin 2 mg at night ?Continue temazepam 30 mg at night as needed for insomnia.  ?Next appointment: 5/12 at 9:30  ?

## 2022-01-03 DIAGNOSIS — Z79899 Other long term (current) drug therapy: Secondary | ICD-10-CM | POA: Diagnosis not present

## 2022-01-03 DIAGNOSIS — M0609 Rheumatoid arthritis without rheumatoid factor, multiple sites: Secondary | ICD-10-CM | POA: Diagnosis not present

## 2022-01-03 DIAGNOSIS — M797 Fibromyalgia: Secondary | ICD-10-CM | POA: Diagnosis not present

## 2022-01-16 DIAGNOSIS — G4733 Obstructive sleep apnea (adult) (pediatric): Secondary | ICD-10-CM | POA: Diagnosis not present

## 2022-01-19 ENCOUNTER — Ambulatory Visit (INDEPENDENT_AMBULATORY_CARE_PROVIDER_SITE_OTHER): Payer: Medicare HMO | Admitting: Licensed Clinical Social Worker

## 2022-01-19 DIAGNOSIS — F4312 Post-traumatic stress disorder, chronic: Secondary | ICD-10-CM | POA: Diagnosis not present
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
## 2022-01-19 NOTE — Plan of Care (Signed)
?  Problem: PTSD-Trauma Disorder CCP Problem  1 Reduce the negative impact trauma related symptoms have on social, occupational, and family functioning. ?Goal: LTG: Reduce frequency, intensity, and duration of PTSD symptoms so daily functioning is improved: Input needed on appropriate metric.  pt self report ?Outcome: Progressing ?Goal: STG: '@PREFFIRSTNAME'$ @ will practice emotion regulation skills 7 per week for the next 16 weeks ?Outcome: Progressing ?  ?Problem: Depression CCP Problem  1 Decrease depressive symptoms and improve levels of effective functioning ?Goal: LTG: Reduce frequency, intensity, and duration of depression symptoms as evidenced by: pt self report ?Outcome: Progressing ?Goal: STG: '@PREFFIRSTNAME'$ @ will participate in at least 80% of scheduled individual psychotherapy sessions ?Outcome: Progressing ?  ?

## 2022-01-25 DIAGNOSIS — R600 Localized edema: Secondary | ICD-10-CM | POA: Diagnosis not present

## 2022-01-25 DIAGNOSIS — I10 Essential (primary) hypertension: Secondary | ICD-10-CM | POA: Diagnosis not present

## 2022-02-01 NOTE — Progress Notes (Signed)
Virtual Visit via Video Note ? ?I connected with Heather Jefferson on 02/02/22 at  9:30 AM EDT by a video enabled telemedicine application and verified that I am speaking with the correct person using two identifiers. ? ?Location: ?Patient: outside ?Provider: office ?Persons participated in the visit- patient, provider  ?  ?I discussed the limitations of evaluation and management by telemedicine and the availability of in person appointments. The patient expressed understanding and agreed to proceed. ? ? ?  ?I discussed the assessment and treatment plan with the patient. The patient was provided an opportunity to ask questions and all were answered. The patient agreed with the plan and demonstrated an understanding of the instructions. ?  ?The patient was advised to call back or seek an in-person evaluation if the symptoms worsen or if the condition fails to improve as anticipated. ? ?I provided 14 minutes of non-face-to-face time during this encounter. ? ? ?Norman Clay, MD ? ? ? ?BH MD/PA/NP OP Progress Note ? ?02/02/2022 10:03 AM ?Heather Jefferson  ?MRN:  [id number] ? ?Chief Complaint:  ?Chief Complaint  ?Patient presents with  ? Follow-up  ? Depression  ? Trauma  ? ?HPI:  ?This is a follow-up appointment for depression, PTSD and insomnia.  ?She states that she feels anxious when she thinks about her daughter.  Her birthday was on her Mother's Day.  She has occasional flashback of her daughter of her being born.  She has been trying to keep herself busy.  Her husband underwent gallbladder surgery, and he has been doing well.  She reports good relationship with her daughter at home.  She has been trying to be positive, although she feels depressed at times.  She states better since using CPAP machine.  She denies change in appetite.  She denies SI.  She feels anxious about upcoming dinner with her mother on Mother's Day.  She agrees to keep boundary.  She denies nightmares.  She has occasional flashback.  She  takes temazepam for sleep.  She feels comfortable to stay on the current medication regimen at this time.  ? ? ?Daily routine: brings her daughter to school, online church, household chores ?Exercise: ?Employment: unemployed, on disability due to fibromyalgia and depression, last work in 2018/01/03, used to work as a Occupational psychologist for 18 years ?Support:  sister in [place] ?Household: husband, 2 children ?Marital status: married for 22 years in Oct 2022 ?Number of children: 10 (40 yo daughter, 56 yo son, who is working) ?Father deceased in Jan 04, 2019, she did not meet with him until she became 39. ? ? ?Visit Diagnosis:  ?  ICD-10-CM   ?1. Insomnia, unspecified type  G47.00   ?  ?2. Chronic post-traumatic stress disorder (PTSD)  F43.12 ARIPiprazole (ABILIFY) 20 MG tablet  ?  ?3. MDD (major depressive disorder), recurrent episode, mild (HCC)  F33.0 ARIPiprazole (ABILIFY) 20 MG tablet  ?  ? ? ?Past Psychiatric History: Please see initial evaluation for full details. I have reviewed the history. No updates at this time.  ?  ? ?Past Medical History:  ?Past Medical History:  ?Diagnosis Date  ? ADHD (attention deficit hyperactivity disorder)   ? Allergy   ? Anemia   ? Anxiety   ? Depression   ? Diabetes mellitus, type II ([place])   ? Fibromyalgia   ? Generalized anxiety disorder   ? Headache   ? HIV infection ([place])   ? Hypertension   ? Hypothyroidism   ? Major depressive disorder, recurrent episode,  moderate ([place])   ? Migraine with acute onset aura   ? Persistent disorder of initiating or maintaining sleep   ? PTSD (post-traumatic stress disorder)   ? Restless leg   ? Seizure disorder ([place])   ? Thyroid disease   ? Vitamin D deficiency   ?  ?Past Surgical History:  ?Procedure Laterality Date  ? COLONOSCOPY WITH PROPOFOL N/A 01/29/2020  ? Procedure: COLONOSCOPY WITH PROPOFOL;  Surgeon: Jonathon Bellows, MD;  [facility] ENDOSCOPY;  Service: Gastroenterology;  Laterality: N/A;  ? EYE SURGERY    ? TUBAL LIGATION    ? ? ?Family Psychiatric History:  Please see initial evaluation for full details. I have reviewed the history. No updates at this time.  ?  ? ?Family History:  ?Family History  ?Problem Relation Age of Onset  ? Hypertension Mother   ? Heart attack Mother   ? CAD Mother   ? Diabetes Father   ? Hypertension Father   ? Hypertension Sister   ? Anxiety disorder Brother   ? Depression Brother   ? ? ?Social History:  ?Social History  ? ?Socioeconomic History  ? Marital status: Married  ?  Spouse name: Heather Jefferson  ? Number of children: 2  ? Years of education: Not on file  ? Highest education level: 10th grade  ?Occupational History  ? Occupation: disabled  ?Tobacco Use  ? Smoking status: Never  ? Smokeless tobacco: Never  ?Vaping Use  ? Vaping Use: Never used  ?Substance and Sexual Activity  ? Alcohol use: Not Currently  ? Drug use: No  ? Sexual activity: Not Currently  ?  Partners: Male  ?  Birth control/protection: Other-see comments, I.U.D.  ?  Comment: husband has ED  ?Other Topics Concern  ? Not on file  ?Social History Narrative  ? 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 ?Social Determinants of Health  ? ?Financial Resource Strain: Low Risk   ? Difficulty of Paying Living Expenses: Not hard at all  ?Food Insecurity: No Food Insecurity  ? Worried About Charity fundraiser in the Last Year: Never true  ? Ran Out of Food in the Last Year: Never true  ?Transportation Needs: No Transportation Needs  ? Lack of Transportation (Medical): No  ? Lack of Transportation (Non-Medical): No  ?Physical Activity: Inactive  ? Days of Exercise per Week: 0 days  ? Minutes of Exercise per Session: 0 min  ?Stress: Stress Concern Present  ? Feeling of Stress : To some extent  ?Social Connections: Moderately Integrated  ? Frequency of Communication with Friends and Family: More than three times a week  ? Frequency of Social Gatherings with Friends and Family: Once a week  ? Attends Religious Services: More  than 4 times per year  ? Active Member of Clubs or Organizations: No  ? Attends Archivist Meetings: Never  ? Marital Status: Married  ? ? ?Allergies:  ?Allergies  ?Allergen Reactions  ? Amoxicillin-Pot Clavulanate Hives  ? Losartan Cough  ? Metformin And Related Diarrhea and Nausea And Vomiting  ? ? ?Metabolic Disorder Labs: ?Lab Results  ?Component Value Date  ? HGBA1C 5.5 05/23/2021  ? MPG 111 05/23/2021  ? MPG 120 05/10/2020  ? ?No results found for: PROLACTIN ?Lab Results  ?Component Value Date  ? CHOL 224 (A) 11/29/2021  ? TRIG 123 11/29/2021  ? HDL 61 11/29/2021  ? CHOLHDL 3.8 05/23/2021  ?  VLDL 38 (H) 10/18/2016  ? [place] 141 11/29/2021  ? LDLCALC 137 (H) 05/23/2021  ? ?Lab Results  ?Component Value Date  ? TSH 2.80 11/29/2021  ? TSH 2.96 05/23/2021  ? ? ?Therapeutic Level Labs: ?No results found for: LITHIUM ?No results found for: VALPROATE ?No components found for:  CBMZ ? ?Current Medications: ?Current Outpatient Medications  ?Medication Sig Dispense Refill  ? ADVAIR HFA 230-21 MCG/ACT inhaler Inhale 2 puffs into the lungs 2 (two) times daily.    ? amLODipine (NORVASC) 5 MG tablet Take 5 mg by mouth daily.    ? ARIPiprazole (ABILIFY) 20 MG tablet Take 1 tablet (20 mg total) by mouth daily. 30 tablet 1  ? buPROPion (WELLBUTRIN XL) 150 MG 24 hr tablet Take 1 tablet (150 mg total) by mouth every morning. 30 tablet 5  ? desvenlafaxine (PRISTIQ) 100 MG 24 hr tablet Take 1 tablet (100 mg total) by mouth daily. 30 tablet 5  ? hydroxychloroquine (PLAQUENIL) 200 MG tablet Take 1 tablet by mouth 2 (two) times daily.    ? levothyroxine (SYNTHROID) 25 MCG tablet Take 1 tablet (25 mcg total) by mouth daily before breakfast. One daily but two on Sundays ( the directions) 102 tablet 3  ? MIRENA, 52 MG, 20 MCG/24HR IUD     ? pantoprazole (PROTONIX) 40 MG tablet Take 40 mg by mouth 2 (two) times daily.    ? prazosin (MINIPRESS) 2 MG capsule Take 1 capsule (2 mg total) by mouth at bedtime. 30 capsule 2  ?  prednisoLONE acetate (PRED FORTE) 1 % ophthalmic suspension Place 1 drop into the right eye daily.     ? pregabalin (LYRICA) 50 MG capsule Take 1-2 capsules (50-100 mg total) by mouth at bedtime. 180 capsule

## 2022-02-02 ENCOUNTER — Encounter: Payer: Self-pay | Admitting: Psychiatry

## 2022-02-02 ENCOUNTER — Telehealth (INDEPENDENT_AMBULATORY_CARE_PROVIDER_SITE_OTHER): Payer: Medicare HMO | Admitting: Psychiatry

## 2022-02-02 DIAGNOSIS — F330 Major depressive disorder, recurrent, mild: Secondary | ICD-10-CM

## 2022-02-02 DIAGNOSIS — F4312 Post-traumatic stress disorder, chronic: Secondary | ICD-10-CM | POA: Diagnosis not present

## 2022-02-02 DIAGNOSIS — G4700 Insomnia, unspecified: Secondary | ICD-10-CM | POA: Diagnosis not present

## 2022-02-02 MED ORDER — ARIPIPRAZOLE 20 MG PO TABS: 20.0000 mg | ORAL_TABLET | Freq: Every day | ORAL | 1 refills | Status: DC

## 2022-02-02 NOTE — Patient Instructions (Signed)
Continue Pristiq 100 mg daily ?Continue bupropion 150 mg daily  ?Continue Abilify 20 mg daily  ?Continue prazosin 2 mg at night ?Continue temazepam 30 mg at night as needed for insomnia.  ?Next appointment: 7/7 at 10 AM ?

## 2022-02-07 DIAGNOSIS — I10 Essential (primary) hypertension: Secondary | ICD-10-CM | POA: Diagnosis not present

## 2022-02-07 DIAGNOSIS — E213 Hyperparathyroidism, unspecified: Secondary | ICD-10-CM | POA: Diagnosis not present

## 2022-02-07 DIAGNOSIS — M069 Rheumatoid arthritis, unspecified: Secondary | ICD-10-CM | POA: Diagnosis not present

## 2022-02-07 DIAGNOSIS — E7800 Pure hypercholesterolemia, unspecified: Secondary | ICD-10-CM | POA: Diagnosis not present

## 2022-02-07 DIAGNOSIS — E039 Hypothyroidism, unspecified: Secondary | ICD-10-CM | POA: Diagnosis not present

## 2022-03-06 ENCOUNTER — Encounter: Payer: Self-pay | Admitting: Family Medicine

## 2022-03-09 ENCOUNTER — Ambulatory Visit (INDEPENDENT_AMBULATORY_CARE_PROVIDER_SITE_OTHER): Payer: Medicare HMO | Admitting: Licensed Clinical Social Worker

## 2022-03-09 DIAGNOSIS — F4312 Post-traumatic stress disorder, chronic: Secondary | ICD-10-CM | POA: Diagnosis not present

## 2022-03-09 NOTE — Plan of Care (Signed)
  Problem: PTSD-Trauma Disorder CCP Problem  1 Reduce the negative impact trauma related symptoms have on social, occupational, and family functioning. Goal: LTG: Reduce frequency, intensity, and duration of PTSD symptoms so daily functioning is improved: Input needed on appropriate metric.  pt self report Outcome: Progressing Goal: STG: '@PREFFIRSTNAME'$ @ will practice emotion regulation skills 7 per week for the next 16 weeks Outcome: Progressing   Problem: Depression CCP Problem  1 Decrease depressive symptoms and improve levels of effective functioning Goal: LTG: Reduce frequency, intensity, and duration of depression symptoms as evidenced by: pt self report Outcome: Progressing Goal: STG: '@PREFFIRSTNAME'$ @ will participate in at least 80% of scheduled individual psychotherapy sessions Outcome: Progressing

## 2022-03-09 NOTE — Progress Notes (Signed)
Virtual Visit via Video Note  I connected with Heather Jefferson on 03/09/22 at  9:00 AM EDT by a video enabled telemedicine application and verified that I am speaking with the correct person using two identifiers.  Location: Patient: home Provider: remote office [place], [place])   I discussed the limitations of evaluation and management by telemedicine and the availability of in person appointments. The patient expressed understanding and agreed to proceed.  I discussed the assessment and treatment plan with the patient. The patient was provided an opportunity to ask questions and all were answered. The patient agreed with the plan and demonstrated an understanding of the instructions.   The patient was advised to call back or seek an in-person evaluation if the symptoms worsen or if the condition fails to improve as anticipated.  I provided 35 minutes of non-face-to-face time during this encounter.   [place], LCSW   THERAPIST PROGRESS NOTE  Session Time: [phone]  Participation Level: Active  Behavioral Response: Neat and Well GroomedAlertDepressed  Type of Therapy: Individual Therapy  Treatment Goals addressed: Problem: PTSD-Trauma Disorder CCP Problem  1 Reduce the negative impact trauma related symptoms have on social, occupational, and family functioning. Goal: LTG: Reduce frequency, intensity, and duration of PTSD symptoms so daily functioning is improved: Input needed on appropriate metric.  pt self report Outcome: Progressing Goal: STG: '@PREFFIRSTNAME'$ @ will practice emotion regulation skills 7 per week for the next 16 weeks Outcome: Progressing   Problem: Depression CCP Problem  1 Decrease depressive symptoms and improve levels of effective functioning Goal: LTG: Reduce frequency, intensity, and duration of depression symptoms as evidenced by: pt self report Outcome: Progressing Goal: STG: '@PREFFIRSTNAME'$ @ will participate in at least 80% of scheduled  individual psychotherapy sessions Outcome: Progressing    ProgressTowards Goals: Progressing  Interventions:  Intervention: Review PLEASE Skills (Treat Physical Illness, Balance Eating, Avoid Mood-Altering Substances, Balance Sleep and Get Exercise) with '@PREFFIRSTNAME'$ @  Intervention: Encourage patient to identify triggers  Intervention: Provide grief and bereavement support  Intervention: Assist with relaxation techniques, as appropriate (deep breathing exercises, meditation, guided imagery)  Intervention: Educate patient on: Stress management  Intervention: Trauma focused CBT  Summary: Heather Jefferson is a 51 y.o. female who presents with symptoms consistent with PTSD. Pt reports that mood has been stable with some occasional depressed days. Pt reports that she is compliant with medication and is managing stress/anxiety well.   Allowed pt to explore and express thoughts and feelings associated with recent life situations and external stressors. Patient reports that she continues to have negative thoughts and feelings towards her mother regarding her daughter's adoption in the past. Patient reports that Mother's Day recently was a trigger, and that her daughter's birthday was recent, and was also a trigger. Wellow patient to explore her life at that time, her mother's life at that time, compared to her current life for both patient and her daughter.  Patient reports that her daughter is going to visit her mother for two weeks and this is gonna be the first time that her daughter has ever been away from her for an extended period of time period patient reports that she has a little hesitation about her mother's discipline, but is not worried. Allowed and assisted patient in identifying ways that she is a good mother to her children. Reviewed coping skills for managing depression, anxiety, and trauma triggers.  Continued recommendations are as follows: self care behaviors, positive social  engagements, focusing on overall work/home/life balance, and focusing  on positive physical and emotional wellness.   Suicidal/Homicidal: No  Therapist Response:  Pt is continuing to apply interventions learned in session into daily life situations. Pt is currently on track to meet goals utilizing interventions mentioned above. Personal growth and progress noted. Treatment to continue as indicated.   Plan: Return again in 4 weeks.  No diagnosis found.   Collaboration of Care: Other Pt encouraged to continue with psychiatrist of record, Dr. Modesta Messing  Patient/Guardian was advised Release of Information must be obtained prior to any record release in order to collaborate their care with an outside provider. Patient/Guardian was advised if they have not already done so to contact the registration department to sign all necessary forms in order for [place] to release information regarding their care.   Consent: Patient/Guardian gives verbal consent for treatment and assignment of benefits for services provided during this visit. Patient/Guardian expressed understanding and agreed to proceed.    [place], LCSW 03/09/2022

## 2022-03-23 ENCOUNTER — Encounter: Payer: Self-pay | Admitting: Internal Medicine

## 2022-03-25 ENCOUNTER — Other Ambulatory Visit: Payer: Self-pay | Admitting: Family Medicine

## 2022-03-25 DIAGNOSIS — M797 Fibromyalgia: Secondary | ICD-10-CM
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
## 2022-03-30 ENCOUNTER — Telehealth (INDEPENDENT_AMBULATORY_CARE_PROVIDER_SITE_OTHER): Payer: Medicare HMO | Admitting: Psychiatry

## 2022-03-30 ENCOUNTER — Encounter: Payer: Self-pay | Admitting: Psychiatry

## 2022-03-30 DIAGNOSIS — G4700 Insomnia, unspecified: Secondary | ICD-10-CM | POA: Diagnosis not present

## 2022-03-30 DIAGNOSIS — F4312 Post-traumatic stress disorder, chronic: Secondary | ICD-10-CM

## 2022-03-30 DIAGNOSIS — F330 Major depressive disorder, recurrent, mild: Secondary | ICD-10-CM

## 2022-03-30 MED ORDER — ARIPIPRAZOLE 20 MG PO TABS: 20.0000 mg | ORAL_TABLET | Freq: Every day | ORAL | 2 refills | Status: DC

## 2022-03-30 MED ORDER — PRAZOSIN HCL 2 MG PO CAPS: 2.0000 mg | ORAL_CAPSULE | Freq: Every day | ORAL | 5 refills | Status: DC

## 2022-03-30 MED ORDER — BUPROPION HCL ER (XL) 300 MG PO TB24: 300.0000 mg | ORAL_TABLET | Freq: Every day | ORAL | 1 refills | Status: DC

## 2022-03-30 NOTE — Patient Instructions (Signed)
Continue Pristiq 100 mg daily Continue bupropion 150 mg daily  Continue Abilify 20 mg daily  Continue prazosin 2 mg at night Continue temazepam 30 mg at night as needed for insomnia.  Next appointment:8/11 at 10:30

## 2022-04-17 ENCOUNTER — Encounter: Payer: Self-pay | Admitting: Internal Medicine

## 2022-04-19 ENCOUNTER — Encounter: Payer: Medicare HMO | Admitting: Family Medicine

## 2022-04-24 ENCOUNTER — Ambulatory Visit (INDEPENDENT_AMBULATORY_CARE_PROVIDER_SITE_OTHER): Payer: Medicare HMO | Admitting: Licensed Clinical Social Worker

## 2022-04-24 DIAGNOSIS — F4312 Post-traumatic stress disorder, chronic: Secondary | ICD-10-CM

## 2022-04-24 NOTE — Progress Notes (Signed)
Virtual Visit via Video Note  I connected with Heather Jefferson on 04/24/22 at 10:00 AM EDT by a video enabled telemedicine application and verified that I am speaking with the correct person using two identifiers.  Location: Patient: home Provider: remote office [place], [place])   I discussed the limitations of evaluation and management by telemedicine and the availability of in person appointments. The patient expressed understanding and agreed to proceed.  I discussed the assessment and treatment plan with the patient. The patient was provided an opportunity to ask questions and all were answered. The patient agreed with the plan and demonstrated an understanding of the instructions.   The patient was advised to call back or seek an in-person evaluation if the symptoms worsen or if the condition fails to improve as anticipated.  I provided 40 minutes of non-face-to-face time during this encounter.   Heather Jefferson R Ethan Kasperski, LCSW   THERAPIST PROGRESS NOTE  Session Time: [id number]  Participation Level: Active  Behavioral Response: Neat and Well GroomedAlertDepressed  Type of Therapy: Individual Therapy  Treatment Goals addressed: Problem: PTSD-Trauma Disorder CCP Problem  1 Reduce the negative impact trauma related symptoms have on social, occupational, and family functioning. Goal: LTG: Reduce frequency, intensity, and duration of PTSD symptoms so daily functioning is improved: Input needed on appropriate metric.  pt self report Outcome: Progressing Goal: STG: '@PREFFIRSTNAME'$ @ will practice emotion regulation skills 7 per week for the next 16 weeks Outcome: Progressing Intervention: Encourage verbalization of feelings/concerns/expectations Note: Explored traumas from past along with more recent family-based revelations   Problem: Depression CCP Problem  1 Decrease depressive symptoms and improve levels of effective functioning Goal: LTG: Reduce frequency, intensity, and duration  of depression symptoms as evidenced by: pt self report Outcome: Progressing Goal: STG: '@PREFFIRSTNAME'$ @ will participate in at least 80% of scheduled individual psychotherapy sessions Outcome: Progressing  ProgressTowards Goals: Progressing  Interventions:  Intervention: Trauma focused CBT; Supportive therapy  Summary: Heather Jefferson is a 51 y.o. female who presents with symptoms consistent with PTSD.  Continued recommendations are as follows: self care behaviors, positive social engagements, focusing on overall work/home/life balance, and focusing on positive physical and emotional wellness.   Suicidal/Homicidal: No  Therapist Response:  Pt is continuing to apply interventions learned in session into daily life situations. Pt is currently on track to meet goals utilizing interventions mentioned above. Personal growth and progress noted. Treatment to continue as indicated.   Plan: Return again in 4 weeks.  Encounter Diagnosis  Name Primary?   Chronic post-traumatic stress disorder (PTSD) Yes    Collaboration of Care: Other Pt encouraged to continue with psychiatrist of record, Dr. Modesta Messing  Patient/Guardian was advised Release of Information must be obtained prior to any record release in order to collaborate their care with an outside provider. Patient/Guardian was advised if they have not already done so to contact the registration department to sign all necessary forms in order for [place] to release information regarding their care.   Consent: Patient/Guardian gives verbal consent for treatment and assignment of benefits for services provided during this visit. Patient/Guardian expressed understanding and agreed to proceed.    Heather Follette, LCSW 04/24/2022

## 2022-04-24 NOTE — Plan of Care (Signed)
  Problem: PTSD-Trauma Disorder CCP Problem  1 Reduce the negative impact trauma related symptoms have on social, occupational, and family functioning. Goal: LTG: Reduce frequency, intensity, and duration of PTSD symptoms so daily functioning is improved: Input needed on appropriate metric.  pt self report Outcome: Progressing Goal: STG: '@PREFFIRSTNAME'$ @ will practice emotion regulation skills 7 per week for the next 16 weeks Outcome: Progressing Intervention: Encourage verbalization of feelings/concerns/expectations Note: Explored traumas from past along with more recent family-based revelations   Problem: Depression CCP Problem  1 Decrease depressive symptoms and improve levels of effective functioning Goal: LTG: Reduce frequency, intensity, and duration of depression symptoms as evidenced by: pt self report Outcome: Progressing Goal: STG: '@PREFFIRSTNAME'$ @ will participate in at least 80% of scheduled individual psychotherapy sessions Outcome: Progressing
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
## 2022-05-04 ENCOUNTER — Encounter: Payer: Self-pay | Admitting: Psychiatry

## 2022-05-04 ENCOUNTER — Telehealth (INDEPENDENT_AMBULATORY_CARE_PROVIDER_SITE_OTHER): Payer: Medicare HMO | Admitting: Psychiatry

## 2022-05-04 DIAGNOSIS — F330 Major depressive disorder, recurrent, mild: Secondary | ICD-10-CM | POA: Diagnosis not present

## 2022-05-04 DIAGNOSIS — F4312 Post-traumatic stress disorder, chronic: Secondary | ICD-10-CM | POA: Diagnosis not present

## 2022-05-04 DIAGNOSIS — G4700 Insomnia, unspecified: Secondary | ICD-10-CM

## 2022-05-04 DIAGNOSIS — R635 Abnormal weight gain: Secondary | ICD-10-CM | POA: Diagnosis not present

## 2022-05-04 MED ORDER — TEMAZEPAM 30 MG PO CAPS: 30.0000 mg | ORAL_CAPSULE | Freq: Every evening | ORAL | 2 refills | Status: DC | PRN

## 2022-05-04 MED ORDER — TOPIRAMATE 25 MG PO TABS: 25.0000 mg | ORAL_TABLET | Freq: Every day | ORAL | 0 refills | Status: DC

## 2022-05-04 NOTE — Patient Instructions (Signed)
Continue Pristiq 100 mg daily Continue bupropion 150 mg daily  Continue Abilify 20 mg daily Start topiramate 25 mg daily  Continue prazosin 2 mg at night Continue temazepam 30 mg at night as needed for insomnia.  Next appointment: 9/14 at 8 AM

## 2022-05-24 ENCOUNTER — Other Ambulatory Visit: Payer: Self-pay | Admitting: Psychiatry
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
## 2022-06-07 ENCOUNTER — Ambulatory Visit (INDEPENDENT_AMBULATORY_CARE_PROVIDER_SITE_OTHER): Payer: Medicare HMO | Admitting: Psychiatry

## 2022-06-07 ENCOUNTER — Encounter: Payer: Self-pay | Admitting: Psychiatry

## 2022-06-07 VITALS — BP 140/85 | HR 71 | Ht 62.0 in | Wt 299.0 lb

## 2022-06-07 DIAGNOSIS — F4312 Post-traumatic stress disorder, chronic: Secondary | ICD-10-CM | POA: Diagnosis not present

## 2022-06-07 DIAGNOSIS — G4700 Insomnia, unspecified: Secondary | ICD-10-CM

## 2022-06-07 DIAGNOSIS — R635 Abnormal weight gain: Secondary | ICD-10-CM

## 2022-06-07 DIAGNOSIS — F331 Major depressive disorder, recurrent, moderate: Secondary | ICD-10-CM | POA: Diagnosis not present

## 2022-06-07 MED ORDER — ARIPIPRAZOLE 20 MG PO TABS: 20.0000 mg | ORAL_TABLET | Freq: Every day | ORAL | 1 refills | Status: DC

## 2022-06-07 MED ORDER — TOPIRAMATE 25 MG PO TABS: 25.0000 mg | ORAL_TABLET | Freq: Every day | ORAL | 1 refills | Status: DC

## 2022-06-07 MED ORDER — DESVENLAFAXINE SUCCINATE ER 100 MG PO TB24: 100.0000 mg | ORAL_TABLET | Freq: Every day | ORAL | 5 refills | Status: DC

## 2022-06-07 MED ORDER — TEMAZEPAM 30 MG PO CAPS: 30.0000 mg | ORAL_CAPSULE | Freq: Every evening | ORAL | 0 refills | Status: DC | PRN

## 2022-06-07 MED ORDER — BUPROPION HCL ER (XL) 150 MG PO TB24: 150.0000 mg | ORAL_TABLET | Freq: Every morning | ORAL | 5 refills | Status: DC

## 2022-06-14 DIAGNOSIS — E559 Vitamin D deficiency, unspecified: Secondary | ICD-10-CM | POA: Diagnosis not present

## 2022-06-14 DIAGNOSIS — E213 Hyperparathyroidism, unspecified: Secondary | ICD-10-CM | POA: Diagnosis not present

## 2022-06-14 DIAGNOSIS — E039 Hypothyroidism, unspecified: Secondary | ICD-10-CM | POA: Diagnosis not present

## 2022-06-14 DIAGNOSIS — K2100 Gastro-esophageal reflux disease with esophagitis, without bleeding: Secondary | ICD-10-CM | POA: Diagnosis not present

## 2022-06-14 DIAGNOSIS — I10 Essential (primary) hypertension: Secondary | ICD-10-CM | POA: Diagnosis not present

## 2022-06-14 DIAGNOSIS — E7800 Pure hypercholesterolemia, unspecified: Secondary | ICD-10-CM | POA: Diagnosis not present

## 2022-06-14 DIAGNOSIS — M069 Rheumatoid arthritis, unspecified: Secondary | ICD-10-CM | POA: Diagnosis not present
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
## 2022-06-20 ENCOUNTER — Ambulatory Visit (INDEPENDENT_AMBULATORY_CARE_PROVIDER_SITE_OTHER): Payer: Medicare HMO | Admitting: Family Medicine

## 2022-06-20 ENCOUNTER — Encounter: Payer: Self-pay | Admitting: Family Medicine

## 2022-06-20 ENCOUNTER — Encounter: Payer: Self-pay | Admitting: Internal Medicine

## 2022-06-20 VITALS — BP 132/78 | HR 74 | Temp 98.1°F | Resp 16 | Ht 62.0 in | Wt 302.2 lb

## 2022-06-20 DIAGNOSIS — N2581 Secondary hyperparathyroidism of renal origin: Secondary | ICD-10-CM | POA: Diagnosis not present

## 2022-06-20 DIAGNOSIS — F3160 Bipolar disorder, current episode mixed, unspecified: Secondary | ICD-10-CM | POA: Diagnosis not present

## 2022-06-20 DIAGNOSIS — Z23 Encounter for immunization: Secondary | ICD-10-CM

## 2022-06-20 DIAGNOSIS — E8881 Metabolic syndrome: Secondary | ICD-10-CM

## 2022-06-20 DIAGNOSIS — M797 Fibromyalgia: Secondary | ICD-10-CM

## 2022-06-20 DIAGNOSIS — E785 Hyperlipidemia, unspecified: Secondary | ICD-10-CM

## 2022-06-20 DIAGNOSIS — E039 Hypothyroidism, unspecified: Secondary | ICD-10-CM | POA: Diagnosis not present

## 2022-06-20 DIAGNOSIS — J4520 Mild intermittent asthma, uncomplicated: Secondary | ICD-10-CM

## 2022-06-20 DIAGNOSIS — G43009 Migraine without aura, not intractable, without status migrainosus: Secondary | ICD-10-CM

## 2022-06-20 DIAGNOSIS — E559 Vitamin D deficiency, unspecified: Secondary | ICD-10-CM | POA: Diagnosis not present

## 2022-06-20 DIAGNOSIS — M059 Rheumatoid arthritis with rheumatoid factor, unspecified: Secondary | ICD-10-CM | POA: Diagnosis not present

## 2022-06-20 MED ORDER — PREGABALIN 100 MG PO CAPS: 100.0000 mg | ORAL_CAPSULE | Freq: Every day | ORAL | 1 refills | Status: DC

## 2022-06-20 MED ORDER — METFORMIN HCL ER 500 MG PO TB24: 500.0000 mg | ORAL_TABLET | Freq: Every day | ORAL | 0 refills | Status: DC

## 2022-06-20 MED ORDER — SUMATRIPTAN 20 MG/ACT NA SOLN: 20.0000 mg | NASAL | 2 refills | Status: DC | PRN

## 2022-06-24 ENCOUNTER — Encounter: Payer: Self-pay | Admitting: Internal Medicine

## 2022-06-25 ENCOUNTER — Encounter: Payer: Self-pay | Admitting: Internal Medicine

## 2022-06-27 ENCOUNTER — Encounter: Payer: Self-pay | Admitting: Family Medicine

## 2022-06-27 ENCOUNTER — Ambulatory Visit
Admission: RE | Admit: 2022-06-27 | Discharge: 2022-06-27 | Disposition: A | Discharge status: Home / Self Care | Payer: Medicare Other | Source: Ambulatory Visit | Attending: Pulmonary Disease | Admitting: Pulmonary Disease

## 2022-06-27 DIAGNOSIS — G4733 Obstructive sleep apnea (adult) (pediatric): Secondary | ICD-10-CM | POA: Diagnosis present

## 2022-06-27 DIAGNOSIS — J849 Interstitial pulmonary disease, unspecified: Secondary | ICD-10-CM | POA: Diagnosis present

## 2022-06-29 ENCOUNTER — Ambulatory Visit: Payer: Self-pay | Admitting: *Deleted

## 2022-06-29 ENCOUNTER — Encounter: Payer: Self-pay | Admitting: Internal Medicine

## 2022-06-29 ENCOUNTER — Telehealth: Payer: Self-pay
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
## 2022-06-29 NOTE — Telephone Encounter (Signed)
Copied from [place] [id number]. Topic: General - Other >> Jun 29, 2022  9:53 AM Cyndi Bender wrote: Reason for CRM: Pt requests that Dr. Ancil Boozer or her nurse return her call to discuss some tests that her husband's doctor would like for her to have done. Pt requests call back to discuss. Cb# [phone]     Called and lvm to return call.

## 2022-06-30 ENCOUNTER — Encounter: Payer: Self-pay | Admitting: Internal Medicine

## 2022-06-30 IMAGING — US US THYROID
1 series · 13 of 25 positions shown · non-contrast
Comparison: None.

CLINICAL DATA: Other.  Hyperparathyroidism.

EXAM:
THYROID ULTRASOUND
TECHNIQUE: Ultrasound examination of the thyroid gland and adjacent soft
tissues was performed.

[Series 1: us thyroid · 0.07mm/px · 13 of 28 slices shown]
[im 1/28]
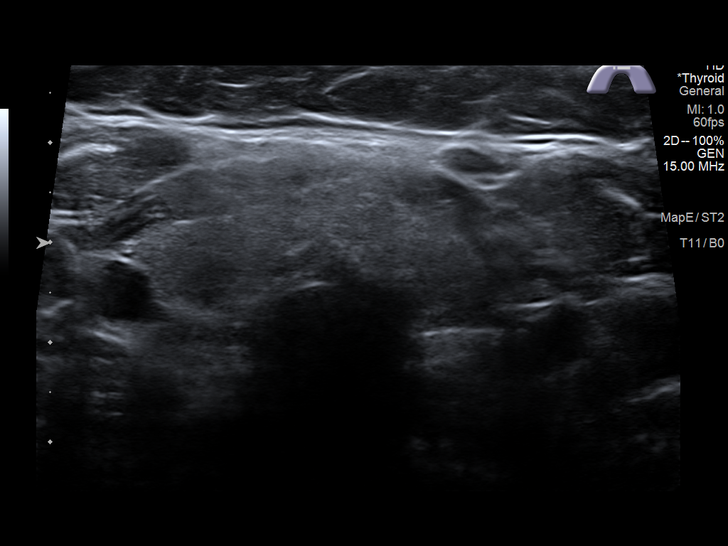
[im 3/28]
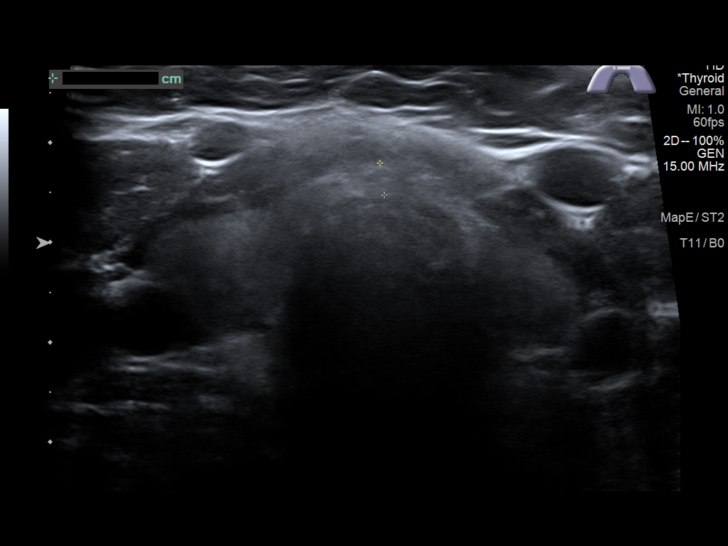
[im 5/28]
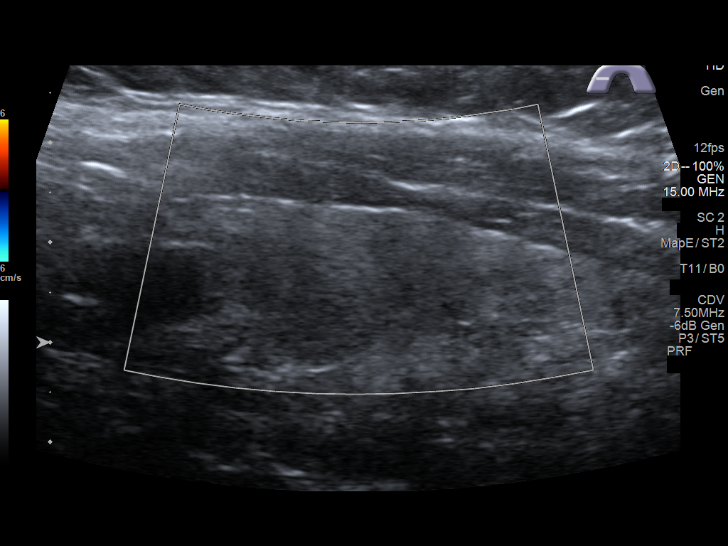
[im 7/28]
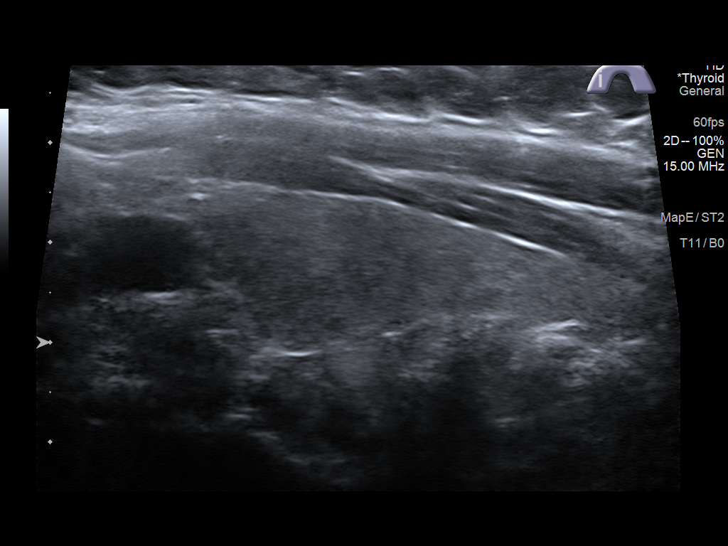
[im 10/28]
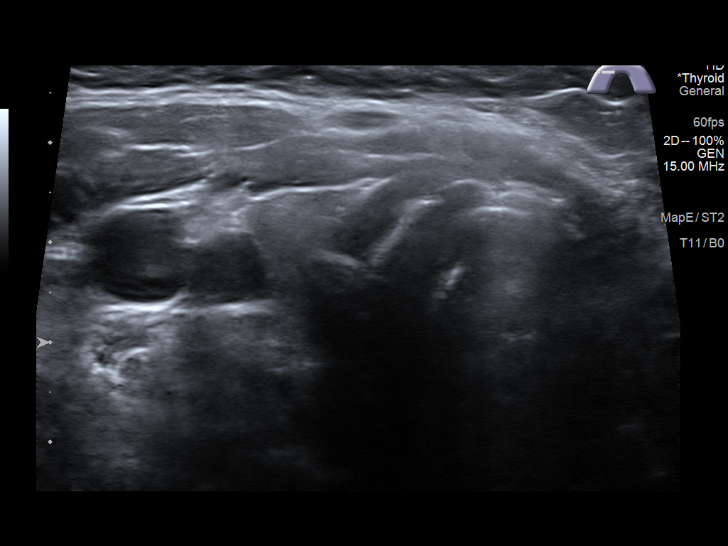
[im 12/28]
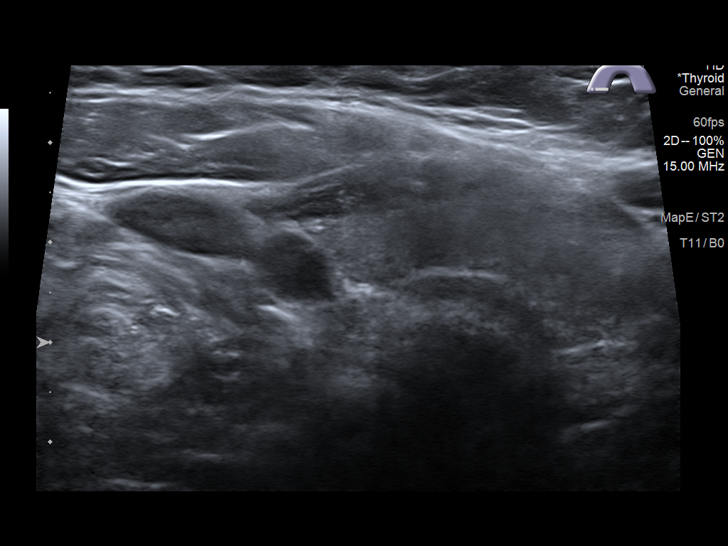
[im 14/28]
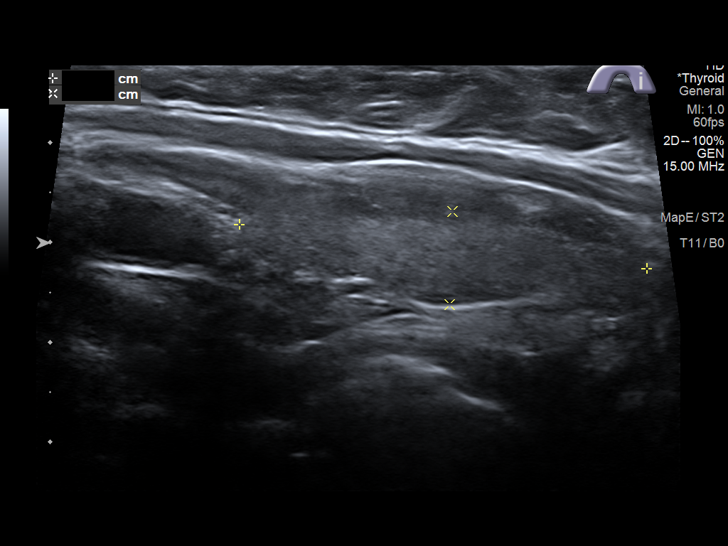
[im 16/28]
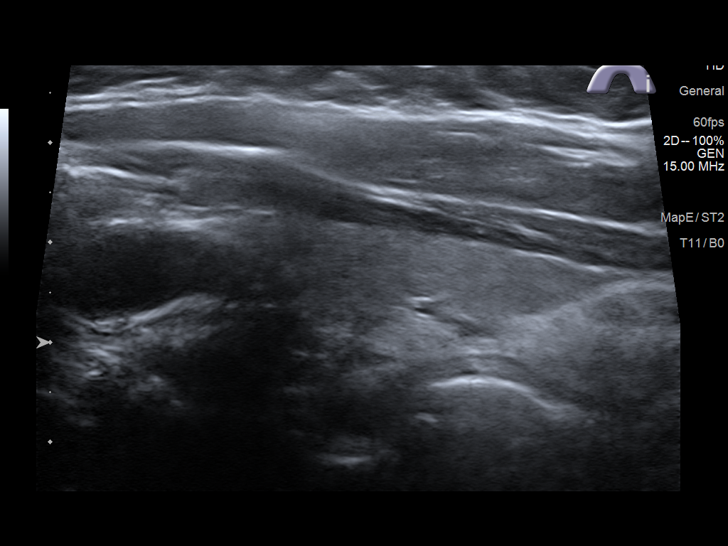
[im 19/28]
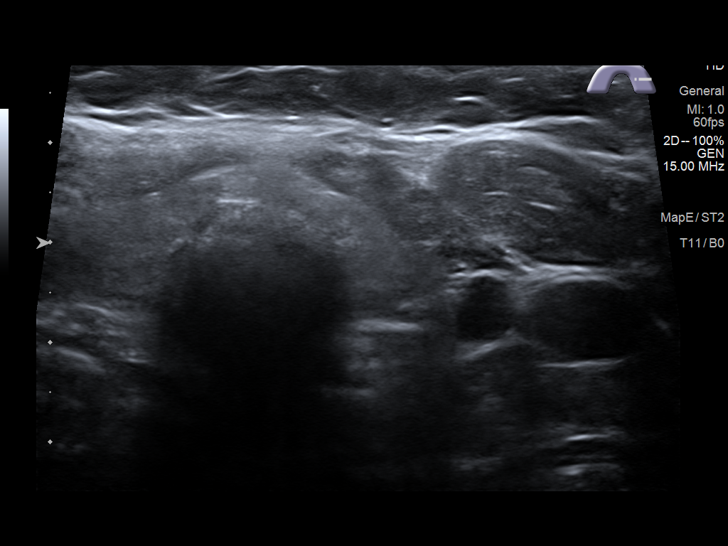
[im 21/28]
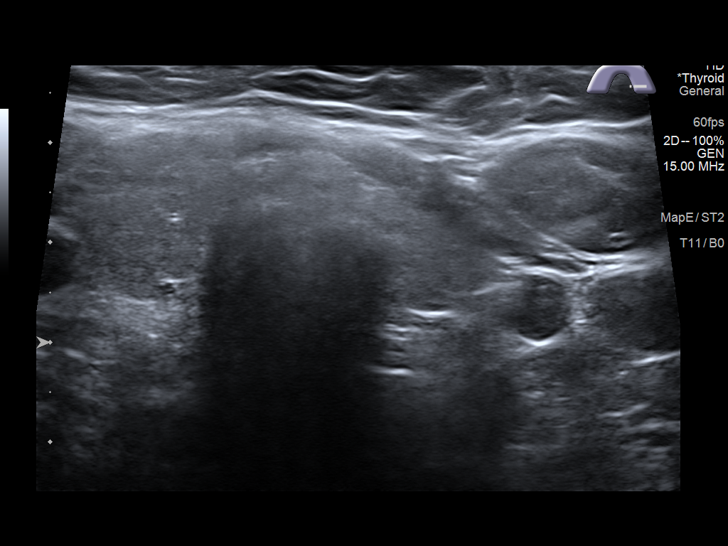
[im 23/28]
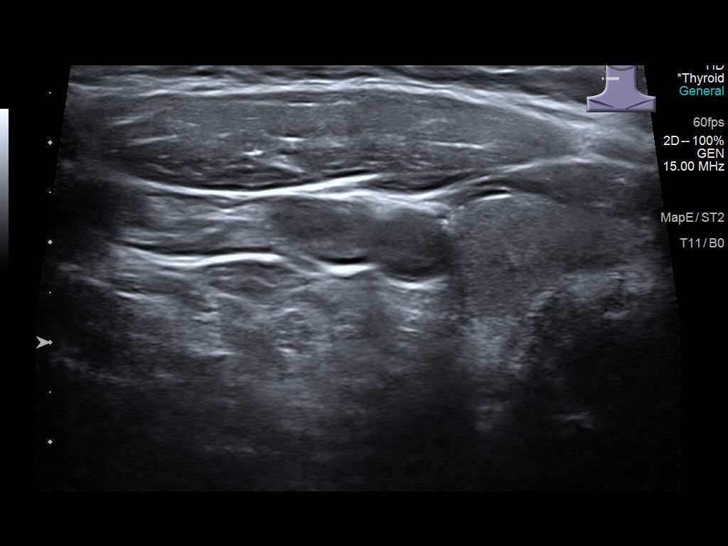
[im 25/28]
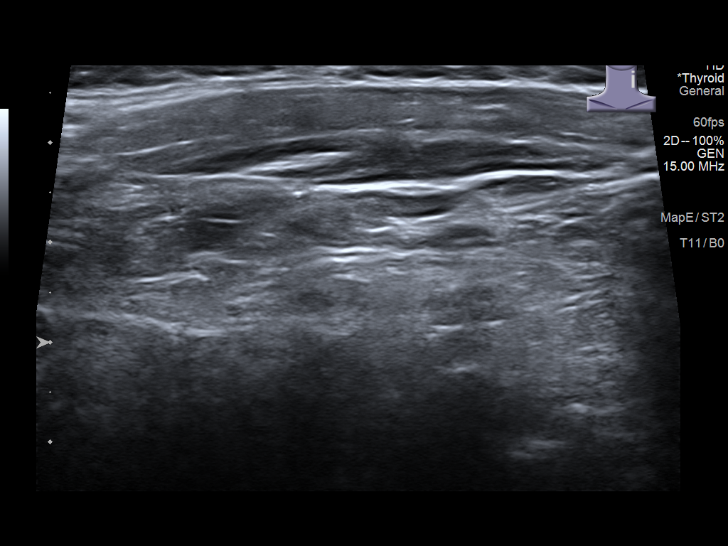
[im 28/28]
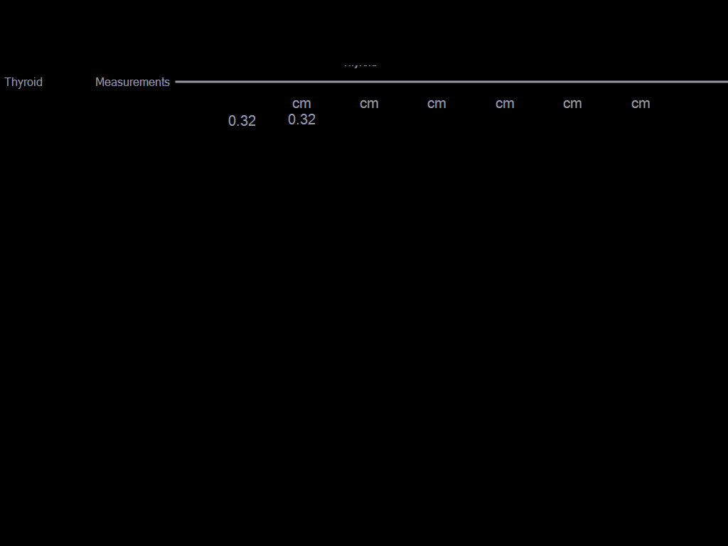

[13 of 25 positions shown; findings below may reference images not displayed]

FINDINGS: Parenchymal Echotexture: Mildly heterogenous

Isthmus: Normal in size measuring 0.3 cm in diameter

Right lobe: Atrophic measuring 4.2 x 1.4 x 1.5 cm

Left lobe: Atrophic measuring 4.1 x 0.9 x 1.2 cm

_________________________________________________________

Estimated total number of nodules >/= 1 cm: 0

Number of spongiform nodules >/=  2 cm not described below (TR1): 0

Number of mixed cystic and solid nodules >/= 1.5 cm not described
below (TR2): 0

_________________________________________________________

No discrete nodules are seen within the thyroid gland.

No discrete extra thyroidal nodules are identified to suggest the
presence of a parathyroid adenoma.
IMPRESSION: 1. Atrophic and mildly heterogeneous thyroid without discrete nodule
or mass. Findings are nonspecific though could be seen in the
setting of a thyroiditis.
2. No discrete extra thyroidal nodules are identified to suggest the
presence of a parathyroid adenoma. Further evaluation with nuclear
medicine parathyroid scintigraphy and/or contrast-enhanced neck CT
could be performed as indicated.
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
## 2022-07-03 ENCOUNTER — Encounter: Payer: Self-pay | Admitting: Family Medicine

## 2022-07-03 ENCOUNTER — Ambulatory Visit (INDEPENDENT_AMBULATORY_CARE_PROVIDER_SITE_OTHER): Payer: Medicare Other | Admitting: Family Medicine

## 2022-07-03 ENCOUNTER — Other Ambulatory Visit (HOSPITAL_COMMUNITY)
Admission: RE | Admit: 2022-07-03 | Discharge: 2022-07-03 | Disposition: A | Discharge status: Home / Self Care | Payer: Medicare Other | Source: Ambulatory Visit | Attending: Family Medicine | Admitting: Family Medicine

## 2022-07-03 ENCOUNTER — Encounter: Payer: Self-pay | Admitting: Internal Medicine

## 2022-07-03 VITALS — BP 132/80 | HR 87 | Temp 98.5°F | Resp 18 | Ht 62.0 in | Wt 300.0 lb

## 2022-07-03 DIAGNOSIS — Z202 Contact with and (suspected) exposure to infections with a predominantly sexual mode of transmission: Secondary | ICD-10-CM | POA: Insufficient documentation

## 2022-07-03 DIAGNOSIS — I517 Cardiomegaly: Secondary | ICD-10-CM | POA: Diagnosis not present

## 2022-07-04 LAB — CERVICOVAGINAL ANCILLARY ONLY
Chlamydia: NEGATIVE
Comment: NEGATIVE
Comment: NEGATIVE
Comment: NORMAL
Neisseria Gonorrhea: NEGATIVE
Trichomonas: NEGATIVE

## 2022-07-04 LAB — RPR: RPR Ser Ql: NONREACTIVE

## 2022-07-04 LAB — HIV ANTIBODY (ROUTINE TESTING W REFLEX): HIV 1&2 Ab, 4th Generation: NONREACTIVE

## 2022-07-10 ENCOUNTER — Ambulatory Visit (INDEPENDENT_AMBULATORY_CARE_PROVIDER_SITE_OTHER): Payer: Medicare Other | Admitting: Licensed Clinical Social Worker

## 2022-07-10 DIAGNOSIS — F4312 Post-traumatic stress disorder, chronic: Secondary | ICD-10-CM | POA: Diagnosis not present

## 2022-07-10 NOTE — Progress Notes (Signed)
Virtual Visit via Video Note  I connected with Heather Jefferson on 07/10/22 at 10:00 AM EDT by a video enabled telemedicine application and verified that I am speaking with the correct person using two identifiers.  Location: Patient: home Provider: remote office [place], [place])   I discussed the limitations of evaluation and management by telemedicine and the availability of in person appointments. The patient expressed understanding and agreed to proceed.  I discussed the assessment and treatment plan with the patient. The patient was provided an opportunity to ask questions and all were answered. The patient agreed with the plan and demonstrated an understanding of the instructions.   The patient was advised to call back or seek an in-person evaluation if the symptoms worsen or if the condition fails to improve as anticipated.  I provided 40 minutes of non-face-to-face time during this encounter.   Spurgeon Gancarz R Kadeidra Coryell, LCSW   THERAPIST PROGRESS NOTE  Session Time: [id number]  Participation Level: Active  Behavioral Response: Neat and Well GroomedAlertDepressed  Type of Therapy: Individual Therapy  Treatment Goals addressed: Problem: PTSD-Trauma Disorder CCP Problem  1 Reduce the negative impact trauma related symptoms have on social, occupational, and family functioning. Goal: LTG: Reduce frequency, intensity, and duration of PTSD symptoms so daily functioning is improved: Input needed on appropriate metric.  pt self report Outcome: Progressing Goal: STG: '@PREFFIRSTNAME'$ @ will practice emotion regulation skills 7 per week for the next 16 weeks Outcome: Progressing Intervention: Encourage self-care activities Note: Continued to encourage Intervention: Assist with relaxation techniques, as appropriate (deep breathing exercises, meditation, guided imagery) Note: Reviewed  Intervention: Encourage verbalization of feelings/concerns/expectations Note: Allowed pt to explore and  express    Problem: Depression CCP Problem  1 Decrease depressive symptoms and improve levels of effective functioning Goal: LTG: Reduce frequency, intensity, and duration of depression symptoms as evidenced by: pt self report Outcome: Progressing Goal: STG: '@PREFFIRSTNAME'$ @ will participate in at least 80% of scheduled individual psychotherapy sessions Outcome: Progressing Intervention: [place] OF DEPRESSION: PHYSICAL SYMPTOMS, MAJOR THOUGHTS AND IMAGES, AND MAJOR BEHAVIORS THEY EXPERIENCED Note: Continued to explore w/ pt--identified triggers and appropriate coping skills Intervention: Encourage compliance with prescribed medication regimen Note: Pt continues to be compliant with medication and visits to psychiatric provider   ProgressTowards Goals: Progressing  Interventions:  Intervention: Trauma focused CBT; Supportive therapy, DBT: emotion regulation  Summary: Heather Jefferson is a 51 y.o. female who presents with symptoms consistent with PTSD. Pt reports that depression symptoms are "coming and going" depending on environmental triggers. Pt compliant with medication and is consistent with psychiatrist appointments. Pt reports that she is getting good quality and quantity of sleep as long as she takes her medications.   Allowed pt to explore and express thoughts and feelings associated with recent life situations and external stressors. Explored topics opened at last session --pt finding out the true origin of her paternity. Discussed overall psychological impact of the revelation and pt reports that it was "eye opening" and that it "makes sense because my mother treated me different than my siblings always". Explored potential cognitive distortions contributing to pts way of thinking. Emphasized importance of pt identifying herself as a great mother, wife, sister, daughter to her own family members. Pt admits that she always  prioritized her children and family.   Explored/discussed relationships with the gentleman that pt thought was her father and the individuals that she thought were her siblings. The siblings are upset  that her mother "lied" to her about her paternity. All sibs are willing to continue relationship w/ pt and her children.   Explored pts ongoing concern about her husband's health--pt feels he may have some dementia. Pt states that she took him to neurologist and they started him on some medication--but the meds made him really sick so pt and husband decided to discontinue medication.   Allowed patient to identify strengths, more specifically her strengths as a parent and role model.  Continued recommendations are as follows: self care behaviors, positive social engagements, focusing on overall work/home/life balance, and focusing on positive physical and emotional wellness.   Suicidal/Homicidal: No  Therapist Response:  Pt is continuing to apply interventions learned in session into daily life situations. Pt is currently on track to meet goals utilizing interventions mentioned above. Personal growth and progress noted. Treatment to continue as indicated.   Pt continues to gain mastery of depression symptoms and overall anxiety/stress symptoms. Pt continues to grow in her understanding of psychological impact of traumatic events she has experienced.  Plan: Return again in 4 weeks.  Encounter Diagnosis  Name Primary?   Chronic post-traumatic stress disorder (PTSD) Yes     Collaboration of Care: Other Pt encouraged to continue with psychiatrist of record, Dr. Modesta Messing  Patient/Guardian was advised Release of Information must be obtained prior to any record release in order to collaborate their care with an outside provider. Patient/Guardian was advised if they have not already done so to contact the registration department to sign all necessary forms in order for [place] to release information regarding  their care.   Consent: Patient/Guardian gives verbal consent for treatment and assignment of benefits for services provided during this visit. Patient/Guardian expressed understanding and agreed to proceed.    [place], LCSW 07/10/2022

## 2022-07-10 NOTE — Plan of Care (Signed)
  Problem: PTSD-Trauma Disorder CCP Problem  1 Reduce the negative impact trauma related symptoms have on social, occupational, and family functioning. Goal: LTG: Reduce frequency, intensity, and duration of PTSD symptoms so daily functioning is improved: Input needed on appropriate metric.  pt self report Outcome: Progressing Goal: STG: '@PREFFIRSTNAME'$ @ will practice emotion regulation skills 7 per week for the next 16 weeks Outcome: Progressing Intervention: Encourage self-care activities Note: Continued to encourage Intervention: Assist with relaxation techniques, as appropriate (deep breathing exercises, meditation, guided imagery) Note: Reviewed  Intervention: Encourage verbalization of feelings/concerns/expectations Note: Allowed pt to explore and express    Problem: Depression CCP Problem  1 Decrease depressive symptoms and improve levels of effective functioning Goal: LTG: Reduce frequency, intensity, and duration of depression symptoms as evidenced by: pt self report Outcome: Progressing Goal: STG: '@PREFFIRSTNAME'$ @ will participate in at least 80% of scheduled individual psychotherapy sessions Outcome: Progressing Intervention: [place] A RECENT EPISODE OF DEPRESSION: PHYSICAL SYMPTOMS, MAJOR THOUGHTS AND IMAGES, AND MAJOR BEHAVIORS THEY EXPERIENCED Note: Continued to explore w/ pt--identified triggers and appropriate coping skills Intervention: Encourage compliance with prescribed medication regimen Note: Pt continues to be compliant with medication and visits to psychiatric provider

## 2022-07-24 ENCOUNTER — Encounter: Payer: Self-pay | Admitting: Internal Medicine

## 2022-08-01 ENCOUNTER — Ambulatory Visit (INDEPENDENT_AMBULATORY_CARE_PROVIDER_SITE_OTHER): Payer: Medicare Other | Admitting: Internal Medicine

## 2022-08-01 ENCOUNTER — Encounter: Payer: Self-pay | Admitting: Internal Medicine

## 2022-08-01 VITALS — BP 130/82 | HR 98 | Temp 98.5°F | Resp 18 | Ht 62.0 in | Wt 297.1 lb

## 2022-08-01 DIAGNOSIS — R051 Acute cough: Secondary | ICD-10-CM | POA: Diagnosis not present

## 2022-08-01 DIAGNOSIS — J069 Acute upper respiratory infection, unspecified: Secondary | ICD-10-CM

## 2022-08-01 MED ORDER — METHYLPREDNISOLONE 4 MG PO TBPK: ORAL_TABLET | ORAL | 0 refills | Status: DC

## 2022-08-01 MED ORDER — HYDROCOD POLI-CHLORPHE POLI ER 10-8 MG/5ML PO SUER: 5.0000 mL | Freq: Two times a day (BID) | ORAL | 0 refills | Status: DC | PRN

## 2022-08-01 NOTE — Progress Notes (Signed)
Acute Office Visit  Subjective:     Patient ID: Heather Jefferson, female    DOB: May 28, 1971, 51 y.o.   MRN: [id number]  Chief Complaint  Patient presents with   Cough    W/ wheezing worse.  [facility] for flu/covid was given inhaler and cough med    HPI Patient is in today for cough. Patient was seen at [place] on Saturday and tested negative for flu and COVID at that time. She was treated with tessalon perles and was given an Albuterol inhaler for her symptoms. Symptoms started 6 days ago.   URI Compliant:  -Worst symptom: -Fever: no - highest tmep 99.7 -Cough: yes, productive with small amount of phlegm  -Shortness of breath: yes -Wheezing: yes -Chest tightness: yes -Chest congestion: no -Nasal congestion: no -Runny nose: no -Post nasal drip: yes -Sore throat: no - started with sore throat  -Sinus pressure: no -Headache: yes -Face pain: no -Ear pain: yes left -Ear pressure: yes left -Vomiting: no -Fatigue: yes -Sick contacts: no -Context: worse -Relief with OTC cold/cough medications:   -Treatments attempted: Mucinex, decongestants, tessalon perles, Albuterol    Review of Systems  Constitutional:  Positive for chills and malaise/fatigue. Negative for fever.  HENT:  Positive for ear pain. Negative for congestion, sinus pain and sore throat.   Respiratory:  Positive for cough, sputum production, shortness of breath and wheezing.   Cardiovascular:  Negative for chest pain.  Gastrointestinal:  Negative for abdominal pain, diarrhea, nausea and vomiting.      Objective:    BP 130/82   Pulse 98   Temp 98.5 F (36.9 C)   Resp 18   Ht '5\' 2"'$  (1.575 m)   Wt 297 lb 1.6 oz (134.8 kg)   SpO2 98%   BMI 54.34 kg/m  BP Readings from Last 3 Encounters:  08/01/22 130/82  07/03/22 132/80  06/20/22 132/78   Wt Readings from Last 3 Encounters:  08/01/22 297 lb 1.6 oz (134.8 kg)  07/03/22 300 lb (136.1 kg)  06/20/22 (!) 302 lb 3.2 oz  (137.1 kg)      Physical Exam Constitutional:      Appearance: Normal appearance.  HENT:     Head: Normocephalic and atraumatic.     Right Ear: Tympanic membrane, ear canal and external ear normal.     Left Ear: Ear canal and external ear normal. There is impacted cerumen.     Nose: Nose normal.     Mouth/Throat:     Mouth: Mucous membranes are moist.     Pharynx: Oropharynx is clear.  Eyes:     Conjunctiva/sclera: Conjunctivae normal.  Cardiovascular:     Rate and Rhythm: Normal rate and regular rhythm.  Pulmonary:     Effort: Pulmonary effort is normal.     Breath sounds: Wheezing present. No rhonchi or rales.     Comments: Inspiratory wheezing throughout Lymphadenopathy:     Cervical: No cervical adenopathy.  Skin:    General: Skin is warm and dry.  Neurological:     General: No focal deficit present.     Mental Status: She is alert. Mental status is at baseline.  Psychiatric:        Mood and Affect: Mood normal.        Behavior: Behavior normal.     No results found for any visits on 08/01/22.      Assessment & Plan:   1. Viral upper respiratory tract infection/Acute cough:  Flu and COVID negative at Urgent Care, will treat symptomatically for wheezing/SOB/cough with stronger cough suppressant and Medrol dose pack. Patient will continue Albuterol as needed. Continue to rest and stay well hydrated. Follow up here if symptoms worsen or fail to improve in 1 week.  - methylPREDNISolone (MEDROL DOSEPAK) 4 MG TBPK tablet; Day 1: Take 8 mg (2 tablets) before breakfast, 4 mg (1 tablet) after lunch, 4 mg (1 tablet) after supper, and 8 mg (2 tablets) at bedtime. Day 2:Take 4 mg (1 tablet) before breakfast, 4 mg (1 tablet) after lunch, 4 mg (1 tablet) after supper, and 8 mg (2 tablets) at bedtime. Day 3: Take 4 mg (1 tablet) before breakfast, 4 mg (1 tablet) after lunch, 4 mg (1 tablet) after supper, and 4 mg (1 tablet) at bedtime. Day 4: Take 4 mg (1 tablet) before breakfast, 4  mg (1 tablet) after lunch, and 4 mg (1 tablet) at bedtime. Day 5: Take 4 mg (1 tablet) before breakfast and 4 mg (1 tablet) at bedtime. Day 6: Take 4 mg (1 tablet) before breakfast.  Dispense: 1 each; Refill: 0 - chlorpheniramine-HYDROcodone (TUSSIONEX) 10-8 MG/5ML; Take 5 mLs by mouth every 12 (twelve) hours as needed for cough.  Dispense: 70 mL; Refill: 0   Meds ordered this encounter  Medications   methylPREDNISolone (MEDROL DOSEPAK) 4 MG TBPK tablet    Sig: Day 1: Take 8 mg (2 tablets) before breakfast, 4 mg (1 tablet) after lunch, 4 mg (1 tablet) after supper, and 8 mg (2 tablets) at bedtime. Day 2:Take 4 mg (1 tablet) before breakfast, 4 mg (1 tablet) after lunch, 4 mg (1 tablet) after supper, and 8 mg (2 tablets) at bedtime. Day 3: Take 4 mg (1 tablet) before breakfast, 4 mg (1 tablet) after lunch, 4 mg (1 tablet) after supper, and 4 mg (1 tablet) at bedtime. Day 4: Take 4 mg (1 tablet) before breakfast, 4 mg (1 tablet) after lunch, and 4 mg (1 tablet) at bedtime. Day 5: Take 4 mg (1 tablet) before breakfast and 4 mg (1 tablet) at bedtime. Day 6: Take 4 mg (1 tablet) before breakfast.    Dispense:  1 each    Refill:  0   chlorpheniramine-HYDROcodone (TUSSIONEX) 10-8 MG/5ML    Sig: Take 5 mLs by mouth every 12 (twelve) hours as needed for cough.    Dispense:  70 mL    Refill:  0    Return if symptoms worsen or fail to improve.  [name], DO

## 2022-08-01 NOTE — Patient Instructions (Signed)
It was great seeing you today!  Plan discussed at today's visit: -Steroids sent to pharmacy, recommend using Mucinex in the morning, cough syrup at night -Use Albuterol every 2 hours as needed for wheezing/shortness of breath  Follow up in: as needed or if symptoms worsen or fail to improve  Take care and let us know if you have any questions or concerns prior to your next visit.  Dr. Rosana Berger
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
## 2022-08-09 ENCOUNTER — Ambulatory Visit (INDEPENDENT_AMBULATORY_CARE_PROVIDER_SITE_OTHER): Payer: Medicare Other | Admitting: Psychiatry

## 2022-08-09 ENCOUNTER — Encounter: Payer: Self-pay | Admitting: Psychiatry

## 2022-08-09 VITALS — BP 153/79 | HR 67 | Temp 97.6°F | Ht 62.0 in | Wt 296.7 lb

## 2022-08-09 DIAGNOSIS — G4700 Insomnia, unspecified: Secondary | ICD-10-CM

## 2022-08-09 DIAGNOSIS — F331 Major depressive disorder, recurrent, moderate: Secondary | ICD-10-CM | POA: Diagnosis not present

## 2022-08-09 DIAGNOSIS — F4312 Post-traumatic stress disorder, chronic: Secondary | ICD-10-CM | POA: Diagnosis not present

## 2022-08-09 DIAGNOSIS — R635 Abnormal weight gain: Secondary | ICD-10-CM | POA: Diagnosis not present

## 2022-08-09 MED ORDER — TOPIRAMATE 25 MG PO TABS: 25.0000 mg | ORAL_TABLET | Freq: Every day | ORAL | 1 refills | Status: DC

## 2022-08-09 MED ORDER — ARIPIPRAZOLE 20 MG PO TABS: 20.0000 mg | ORAL_TABLET | Freq: Every day | ORAL | 1 refills | Status: DC

## 2022-08-09 MED ORDER — TEMAZEPAM 30 MG PO CAPS: 30.0000 mg | ORAL_CAPSULE | Freq: Every evening | ORAL | 1 refills | Status: DC | PRN

## 2022-08-09 NOTE — Patient Instructions (Signed)
Continue Pristiq 100 mg daily Continue bupropion 150 mg daily  Continue Abilify 20 mg daily Continue topiramate 25 mg daily  Continue prazosin 2 mg at night Continue temazepam 30 mg at night as needed for insomnia.  Next appointment: 1/29 at 8 AM

## 2022-08-21 ENCOUNTER — Ambulatory Visit (INDEPENDENT_AMBULATORY_CARE_PROVIDER_SITE_OTHER): Payer: Medicare Other | Admitting: Licensed Clinical Social Worker

## 2022-08-21 DIAGNOSIS — F4312 Post-traumatic stress disorder, chronic: Secondary | ICD-10-CM | POA: Diagnosis not present

## 2022-08-21 DIAGNOSIS — F331 Major depressive disorder, recurrent, moderate: Secondary | ICD-10-CM
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
## 2022-09-10 ENCOUNTER — Other Ambulatory Visit: Payer: Self-pay | Admitting: Psychiatry

## 2022-09-13 ENCOUNTER — Other Ambulatory Visit: Payer: Self-pay | Admitting: Family Medicine

## 2022-09-13 DIAGNOSIS — E88819 Insulin resistance, unspecified: Secondary | ICD-10-CM
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
## 2022-09-21 NOTE — Patient Instructions (Incomplete)
Preventive Care 40-52 Years Old, Female Preventive care refers to lifestyle choices and visits with your health care provider that can promote health and wellness. Preventive care visits are also called wellness exams. What can I expect for my preventive care visit? Counseling Your health care provider may ask you questions about your: Medical history, including: Past medical problems. Family medical history. Pregnancy history. Current health, including: Menstrual cycle. Method of birth control. Emotional well-being. Home life and relationship well-being. Sexual activity and sexual health. Lifestyle, including: Alcohol, nicotine or tobacco, and drug use. Access to firearms. Diet, exercise, and sleep habits. Work and work environment. Sunscreen use. Safety issues such as seatbelt and bike helmet use. Physical exam Your health care provider will check your: Height and weight. These may be used to calculate your BMI (body mass index). BMI is a measurement that tells if you are at a healthy weight. Waist circumference. This measures the distance around your waistline. This measurement also tells if you are at a healthy weight and may help predict your risk of certain diseases, such as type 2 diabetes and high blood pressure. Heart rate and blood pressure. Body temperature. Skin for abnormal spots. What immunizations do I need?  Vaccines are usually given at various ages, according to a schedule. Your health care provider will recommend vaccines for you based on your age, medical history, and lifestyle or other factors, such as travel or where you work. What tests do I need? Screening Your health care provider may recommend screening tests for certain conditions. This may include: Lipid and cholesterol levels. Diabetes screening. This is done by checking your blood sugar (glucose) after you have not eaten for a while (fasting). Pelvic exam and Pap test. Hepatitis B test. Hepatitis C  test. HIV (human immunodeficiency virus) test. STI (sexually transmitted infection) testing, if you are at risk. Lung cancer screening. Colorectal cancer screening. Mammogram. Talk with your health care provider about when you should start having regular mammograms. This may depend on whether you have a family history of breast cancer. BRCA-related cancer screening. This may be done if you have a family history of breast, ovarian, tubal, or peritoneal cancers. Bone density scan. This is done to screen for osteoporosis. Talk with your health care provider about your test results, treatment options, and if necessary, the need for more tests. Follow these instructions at home: Eating and drinking  Eat a diet that includes fresh fruits and vegetables, whole grains, lean protein, and low-fat dairy products. Take vitamin and mineral supplements as recommended by your health care provider. Do not drink alcohol if: Your health care provider tells you not to drink. You are pregnant, may be pregnant, or are planning to become pregnant. If you drink alcohol: Limit how much you have to 0-1 drink a day. Know how much alcohol is in your drink. In the [place] one drink equals one 12 oz bottle of beer (355 mL), one 5 oz glass of wine (148 mL), or one 1 oz glass of hard liquor (44 mL). Lifestyle Brush your teeth every morning and night with fluoride toothpaste. Floss one time each day. Exercise for at least 30 minutes 5 or more days each week. Do not use any products that contain nicotine or tobacco. These products include cigarettes, chewing tobacco, and vaping devices, such as e-cigarettes. If you need help quitting, ask your health care provider. Do not use drugs. If you are sexually active, practice safe sex. Use a condom or other form of protection to   prevent STIs. If you do not wish to become pregnant, use a form of birth control. If you plan to become pregnant, see your health care provider for a  prepregnancy visit. Take aspirin only as told by your health care provider. Make sure that you understand how much to take and what form to take. Work with your health care provider to find out whether it is safe and beneficial for you to take aspirin daily. Find healthy ways to manage stress, such as: Meditation, yoga, or listening to music. Journaling. Talking to a trusted person. Spending time with friends and family. Minimize exposure to UV radiation to reduce your risk of skin cancer. Safety Always wear your seat belt while driving or riding in a vehicle. Do not drive: If you have been drinking alcohol. Do not ride with someone who has been drinking. When you are tired or distracted. While texting. If you have been using any mind-altering substances or drugs. Wear a helmet and other protective equipment during sports activities. If you have firearms in your house, make sure you follow all gun safety procedures. Seek help if you have been physically or sexually abused. What's next? Visit your health care provider once a year for an annual wellness visit. Ask your health care provider how often you should have your eyes and teeth checked. Stay up to date on all vaccines. This information is not intended to replace advice given to you by your health care provider. Make sure you discuss any questions you have with your health care provider. Document Revised: 03/08/2021 Document Reviewed: 03/08/2021 Elsevier Patient Education  [place].

## 2022-09-25 ENCOUNTER — Ambulatory Visit (INDEPENDENT_AMBULATORY_CARE_PROVIDER_SITE_OTHER): Payer: Medicare Other | Admitting: Family Medicine

## 2022-09-25 ENCOUNTER — Encounter: Payer: Self-pay | Admitting: Family Medicine

## 2022-09-25 VITALS — BP 136/82 | HR 94 | Resp 16 | Ht 62.0 in | Wt 296.0 lb

## 2022-09-25 DIAGNOSIS — Z0000 Encounter for general adult medical examination without abnormal findings: Secondary | ICD-10-CM

## 2022-09-25 DIAGNOSIS — Z1231 Encounter for screening mammogram for malignant neoplasm of breast: Secondary | ICD-10-CM | POA: Diagnosis not present

## 2022-10-03 ENCOUNTER — Ambulatory Visit (HOSPITAL_COMMUNITY): Payer: Medicare Other | Admitting: Licensed Clinical Social Worker

## 2022-10-03 DIAGNOSIS — G4733 Obstructive sleep apnea (adult) (pediatric): Secondary | ICD-10-CM | POA: Diagnosis not present

## 2022-10-04 ENCOUNTER — Encounter (HOSPITAL_COMMUNITY): Payer: Self-pay | Admitting: Licensed Clinical Social Worker

## 2022-10-04 ENCOUNTER — Ambulatory Visit (INDEPENDENT_AMBULATORY_CARE_PROVIDER_SITE_OTHER): Payer: Medicare Other | Admitting: Licensed Clinical Social Worker

## 2022-10-04 DIAGNOSIS — F331 Major depressive disorder, recurrent, moderate: Secondary | ICD-10-CM | POA: Diagnosis not present

## 2022-10-04 DIAGNOSIS — F4312 Post-traumatic stress disorder, chronic: Secondary | ICD-10-CM | POA: Diagnosis not present

## 2022-10-04 NOTE — Progress Notes (Signed)
Virtual Visit via Video Note  I connected with Heather Jefferson on 10/04/22 at  2:00 PM EST by a video enabled telemedicine application and verified that I am speaking with the correct person using two identifiers.  Location: Patient: home Provider: remote office [facility], [place])  Video connection was lost when less than 50% of the duration of the visit was complete, at which time the remainder of the visit was completed via audio only.   I discussed the limitations of evaluation and management by telemedicine and the availability of in person appointments. The patient expressed understanding and agreed to proceed.  I discussed the assessment and treatment plan with the patient. The patient was provided an opportunity to ask questions and all were answered. The patient agreed with the plan and demonstrated an understanding of the instructions.   The patient was advised to call back or seek an in-person evaluation if the symptoms worsen or if the condition fails to improve as anticipated.  I provided 40 minutes of non-face-to-face time during this encounter.   [place], LCSW   THERAPIST PROGRESS NOTE  Session Time: 2-240p  Participation Level: Active  Behavioral Response: Neat and Well GroomedAlertDepressed  Type of Therapy: Individual Therapy  Treatment Goals addressed: Develop healthy thinking patterns and beliefs about self, others, and the world that lead to the alleviation and help prevent the relapse of depression per self report 3 out of 5 sessions documented   Reduction in intrusive event recollections, avoidance of event reminders, intense arousal, or disinterest in activities or relationships. as evidenced by pt self report 3 out of 5 sessions documented   ProgressTowards Goals: Progressing  Interventions:  Intervention: Trauma focused CBT; Supportive therapy, DBT: emotion regulation  Summary: Heather Jefferson is a 52 y.o. female who presents with  symptoms consistent with PTSD and depression. Pt reports that she is compliant with her medication and is managing stress "as best as I can".   Allowed pt to explore and express thoughts and feelings associated with recent life situations and external stressors.Patient reports that one of her biggest stressors recently is the recent diagnosis of her husband with dementia. Patient reports that she does not know anyone who has had dementia in the past, and is unsure on next steps. Patient reports that the neurologist did not give her any psycho educational materials about memory loss, or being a caregiver. Encouraged patient to get the book "meet me where I am" which is a book geared towards caregivers of individuals with memory loss and dementia. Patient reports that her sister has been a good support system for her. Patient reports that her children are aware of what's going on with their father, but they don't really understand the depth of the diagnosis. Patient reports that this news has impacted her mood in a negative way, but patient states she is working  hard to pull herself out of depression when she feels herself experiencing more symptoms. Patient reports that she is maintaining a positive mindset, she is journaling, listening to music, and engaging with her family members more. Praised patient's overall self-awareness, and ability to be intentional about using her coping skills in the moment.   Continued recommendations are as follows: self care behaviors, positive social engagements, focusing on overall work/home/life balance, and focusing on positive physical and emotional wellness.   Suicidal/Homicidal: No  Therapist Response:  Pt is continuing to apply interventions learned in session into daily life situations. Pt is currently on track to meet  goals utilizing interventions mentioned above. Personal growth and progress noted. Treatment to continue as indicated.   Plan: Return again in 4  weeks.  Encounter Diagnoses  Name Primary?   Chronic post-traumatic stress disorder (PTSD) Yes   MDD (major depressive disorder), recurrent episode, moderate ([place])    Collaboration of Care: Other Pt encouraged to continue with psychiatrist of record, Dr. Modesta Messing  Patient/Guardian was advised Release of Information must be obtained prior to any record release in order to collaborate their care with an outside provider. Patient/Guardian was advised if they have not already done so to contact the registration department to sign all necessary forms in order for [place] to release information regarding their care.   Consent: Patient/Guardian gives verbal consent for treatment and assignment of benefits for services provided during this visit. Patient/Guardian expressed understanding and agreed to proceed.    [place], LCSW 10/04/2022

## 2022-10-15 ENCOUNTER — Encounter: Payer: Self-pay | Admitting: Internal Medicine

## 2022-10-17 ENCOUNTER — Other Ambulatory Visit: Payer: Self-pay | Admitting: Psychiatry
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
## 2022-10-22 ENCOUNTER — Telehealth (INDEPENDENT_AMBULATORY_CARE_PROVIDER_SITE_OTHER): Payer: 59 | Admitting: Psychiatry

## 2022-10-22 ENCOUNTER — Encounter: Payer: Self-pay | Admitting: Psychiatry

## 2022-10-22 DIAGNOSIS — R635 Abnormal weight gain: Secondary | ICD-10-CM

## 2022-10-22 DIAGNOSIS — F4312 Post-traumatic stress disorder, chronic: Secondary | ICD-10-CM | POA: Diagnosis not present

## 2022-10-22 DIAGNOSIS — F331 Major depressive disorder, recurrent, moderate: Secondary | ICD-10-CM | POA: Diagnosis not present

## 2022-10-22 MED ORDER — ARIPIPRAZOLE 20 MG PO TABS: 20.0000 mg | ORAL_TABLET | Freq: Every day | ORAL | 1 refills | Status: DC

## 2022-10-22 MED ORDER — TOPIRAMATE 25 MG PO TABS: 25.0000 mg | ORAL_TABLET | Freq: Every day | ORAL | 5 refills | Status: DC

## 2022-10-22 MED ORDER — TEMAZEPAM 30 MG PO CAPS: 30.0000 mg | ORAL_CAPSULE | Freq: Every evening | ORAL | 1 refills | Status: DC | PRN

## 2022-10-25 DIAGNOSIS — G4733 Obstructive sleep apnea (adult) (pediatric): Secondary | ICD-10-CM | POA: Diagnosis not present

## 2022-10-27 ENCOUNTER — Other Ambulatory Visit: Payer: Self-pay | Admitting: Psychiatry

## 2022-10-29 DIAGNOSIS — G4733 Obstructive sleep apnea (adult) (pediatric): Secondary | ICD-10-CM | POA: Diagnosis not present

## 2022-10-29 DIAGNOSIS — J849 Interstitial pulmonary disease, unspecified: Secondary | ICD-10-CM | POA: Diagnosis not present

## 2022-11-01 ENCOUNTER — Ambulatory Visit (INDEPENDENT_AMBULATORY_CARE_PROVIDER_SITE_OTHER): Payer: 59 | Admitting: Licensed Clinical Social Worker

## 2022-11-01 DIAGNOSIS — F4312 Post-traumatic stress disorder, chronic: Secondary | ICD-10-CM

## 2022-11-01 DIAGNOSIS — F331 Major depressive disorder, recurrent, moderate: Secondary | ICD-10-CM

## 2022-11-01 NOTE — Progress Notes (Signed)
Virtual Visit via Video Note  I connected with DUA DISHON on 11/01/22 at  2:00 PM EST by a video enabled telemedicine application and verified that I am speaking with the correct person using two identifiers.  Location: Patient: home Provider: remote office [place], [place])  I discussed the limitations of evaluation and management by telemedicine and the availability of in person appointments. The patient expressed understanding and agreed to proceed.  I discussed the assessment and treatment plan with the patient. The patient was provided an opportunity to ask questions and all were answered. The patient agreed with the plan and demonstrated an understanding of the instructions.   The patient was advised to call back or seek an in-person evaluation if the symptoms worsen or if the condition fails to improve as anticipated.  I provided 30 minutes of non-face-to-face time during this encounter.   Ahmarion Saraceno R Beau [place], LCSW   THERAPIST PROGRESS NOTE  Session Time: 2-230p  Participation Level: Active  Behavioral Response: Neat and Well GroomedAlertDepressed  Type of Therapy: Individual Therapy  Treatment Goals addressed: Develop healthy thinking patterns and beliefs about self, others, and the world that lead to the alleviation and help prevent the relapse of depression per self report 3 out of 5 sessions documented   Reduction in intrusive event recollections, avoidance of event reminders, intense arousal, or disinterest in activities or relationships. as evidenced by pt self report 3 out of 5 sessions documented   ProgressTowards Goals: Progressing  Interventions:  Intervention: Trauma focused CBT; Supportive therapy, DBT: emotion regulation  Summary: DARBIE DUCHENE is a 52 y.o. female who presents with symptoms consistent with PTSD and depression. Pt reports that she is compliant with her medication and is managing stress and anxiety well.   Allowed pt to explore  and express thoughts and feelings associated with recent life situations and external stressors.  Patient reports that she is experiencing stress associated with the new diagnosis that her husband received of dementia.  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  Encouraged patient to use self-care behaviors on a routine basis to manage stress associated with being a caregiver.  Patient reflects understanding.  Discussed relationship with mother and sister--patient reports that she heard from another family member (brother) that her mother has been making negative comments about her daughter's hair, which patient reports triggered feelings of hurt.  Explored timeline of hurtful things that patient's mother has said and done throughout the years, and how patient frequently will overlook because she has forgiven her mother.  Patient reports that her paternal sister did reach out to her and stated that she spoke with patient's biological father, who is open to communicating with her.  Patient states that she has his work phone number but does not have a direct telephone number for him.  Patient reports that she feels intimidated to call and asked to speak to him at his place of employment.  Patient reports that overall she feels her physical health is doing well--patient reports some chronic pain but feels that she is managing it well.  Reviewed coping skills for managing depression symptoms, anxiety symptoms, and overall stress.  Encouraged patient to continue seeking resources regarding caregiver support for individuals with memory loss and/or dementia.  Continued recommendations are as follows: self care behaviors, positive social engagements, focusing on overall work/home/life balance, and focusing on positive physical and emotional wellness.  Suicidal/Homicidal: No  Therapist Response:  Pt is continuing to apply  interventions learned in session into daily life situations. Pt is currently on track to meet goals utilizing interventions mentioned above. Personal growth and progress noted. Treatment to continue as indicated.   Plan: Return again in 4 weeks.  Encounter Diagnoses  Name Primary?   Chronic post-traumatic stress disorder (PTSD) Yes   MDD (major depressive disorder), recurrent episode, moderate ([place])     Collaboration of Care: Other Pt encouraged to continue with psychiatrist of record, Dr. Modesta Messing  Patient/Guardian was advised Release of Information must be obtained prior to any record release in order to collaborate their care with an outside provider. Patient/Guardian was advised if they have not already done so to contact the registration department to sign all necessary forms in order for [place] to release information regarding their care.   Consent: Patient/Guardian gives verbal consent for treatment and assignment of benefits for services provided during this visit. Patient/Guardian expressed understanding and agreed to proceed.    [place], LCSW 11/01/2022

## 2022-11-03 DIAGNOSIS — G4733 Obstructive sleep apnea (adult) (pediatric): Secondary | ICD-10-CM | POA: Diagnosis not present

## 2022-11-29 ENCOUNTER — Ambulatory Visit: Payer: Medicare Other | Admitting: Family Medicine

## 2022-12-02 DIAGNOSIS — G4733 Obstructive sleep apnea (adult) (pediatric): Secondary | ICD-10-CM | POA: Diagnosis not present

## 2022-12-11 ENCOUNTER — Ambulatory Visit (INDEPENDENT_AMBULATORY_CARE_PROVIDER_SITE_OTHER): Payer: 59 | Admitting: Licensed Clinical Social Worker

## 2022-12-11 ENCOUNTER — Ambulatory Visit: Payer: Medicare HMO

## 2022-12-11 DIAGNOSIS — F331 Major depressive disorder, recurrent, moderate: Secondary | ICD-10-CM

## 2022-12-11 DIAGNOSIS — F4312 Post-traumatic stress disorder, chronic: Secondary | ICD-10-CM | POA: Diagnosis not present

## 2022-12-11 NOTE — Progress Notes (Signed)
Virtual Visit via Video Note  I connected with Heather Jefferson on 12/11/22 at 11:00 AM EDT by a video enabled telemedicine application and verified that I am speaking with the correct person using two identifiers.  Location: Patient: home Provider: remote office [place], [place])  I discussed the limitations of evaluation and management by telemedicine and the availability of in person appointments. The patient expressed understanding and agreed to proceed.  I discussed the assessment and treatment plan with the patient. The patient was provided an opportunity to ask questions and all were answered. The patient agreed with the plan and demonstrated an understanding of the instructions.   The patient was advised to call back or seek an in-person evaluation if the symptoms worsen or if the condition fails to improve as anticipated.  I provided 50 minutes of non-face-to-face time during this encounter.   Mann Skaggs R Yannely Kintzel, LCSW   THERAPIST PROGRESS NOTE  Session Time: 11-1150p  Participation Level: Active  Behavioral Response: Neat and Well GroomedAlertDepressed  Type of Therapy: Individual Therapy  Treatment Goals addressed: Develop healthy thinking patterns and beliefs about self, others, and the world that lead to the alleviation and help prevent the relapse of depression per self report 3 out of 5 sessions documented   Reduction in intrusive event recollections, avoidance of event reminders, intense arousal, or disinterest in activities or relationships. as evidenced by pt self report 3 out of 5 sessions documented   ProgressTowards Goals: Progressing  Interventions:  Intervention: Trauma focused CBT; Supportive therapy, DBT: emotion regulation  Summary: Heather Jefferson is a 52 y.o. female who presents with symptoms consistent with PTSD and depression.  Patient reports that she has been experiencing flashbacks and nightmares recently.  Patient states that she feels  that she recently made a tik tok post that was the trigger for her nightmares/flashbacks. Pt states that she has been thinking about making a list of all the people that have hurt her in her life, so that she can get to a point of forgiveness.  Allowed patient to explore the concept of forgiveness, and discussed what it means to her.  Reviewed coping mechanisms for managing flashbacks in the moment, and managing any panic attacks that are triggered by the flashbacks.  Patient reports that she is continuing to experience stress associated with her husband's diagnosis of memory loss/dementia and stage IV kidney failure.  Patient states that doctors feel her husband will need dialysis soon.  Patient states that she has had some family members offered to help-encouraged patient to get as much help as she possibly can to assist with her finding respite time.  Patient states the individuals own her forgiveness list are as follows: Her mother, her father, the baby's father's aunt, patient's stepsister, patient's and, the baby's father.  Patient states that she feels her entire family turned their back on her when she had the baby.  Patient states that she came to the realization that her baby is now 75 years old--patient states that realization is shocking to her.  Patient reports that she wants to expand her overall spiritual growth, and is currently seeking hope and comfort in her beliefs.  Continued recommendations are as follows: self care behaviors, positive social engagements, focusing on overall work/home/life balance, and focusing on positive physical and emotional wellness.   Suicidal/Homicidal: No  Therapist Response:  Pt is continuing to apply interventions learned in session into daily life situations. Pt is currently on track to meet goals utilizing interventions mentioned  above. Personal growth and progress noted. Treatment to continue as indicated.   Plan: Return again in 4 weeks.  No  diagnosis found.   Collaboration of Care: Other Pt encouraged to continue with psychiatrist of record, Dr. Modesta Messing  Patient/Guardian was advised Release of Information must be obtained prior to any record release in order to collaborate their care with an outside provider. Patient/Guardian was advised if they have not already done so to contact the registration department to sign all necessary forms in order for [place] to release information regarding their care.   Consent: Patient/Guardian gives verbal consent for treatment and assignment of benefits for services provided during this visit. Patient/Guardian expressed understanding and agreed to proceed.    [place], LCSW 12/11/2022

## 2022-12-13 ENCOUNTER — Ambulatory Visit (INDEPENDENT_AMBULATORY_CARE_PROVIDER_SITE_OTHER): Payer: 59

## 2022-12-13 VITALS — Ht 62.0 in | Wt 296.0 lb

## 2022-12-13 DIAGNOSIS — Z0000 Encounter for general adult medical examination without abnormal findings: Secondary | ICD-10-CM

## 2022-12-13 DIAGNOSIS — Z1231 Encounter for screening mammogram for malignant neoplasm of breast: Secondary | ICD-10-CM

## 2022-12-13 NOTE — Patient Instructions (Signed)
Heather Jefferson , Thank you for taking time to come for your Medicare Wellness Visit. I appreciate your ongoing commitment to your health goals. Please review the following plan we discussed and let me know if I can assist you in the future.   These are the goals we discussed:  Goals      Weight (lb) < 225 lb (102.1 kg)     Pt states she would like to lose 25 lbs over the next year with healthy eating and exercise        This is a list of the screening recommended for you and due dates:  Health Maintenance  Topic Date Due   Mammogram  10/09/2019   COVID-19 Vaccine (4 - 2023-24 season) 05/25/2022   Pap Smear  07/09/2023   Medicare Annual Wellness Visit  12/13/2023   Colon Cancer Screening  01/28/2025   DTaP/Tdap/Td vaccine (3 - Td or Tdap) 09/30/2028   Flu Shot  Completed   Hepatitis C Screening: USPSTF Recommendation to screen - Ages 18-79 yo.  Completed   HIV Screening  Completed   Zoster (Shingles) Vaccine  Completed   HPV Vaccine  Aged Out    Advanced directives: no  Conditions/risks identified: falls risk  Next appointment: Follow up in one year for your annual wellness visit. 12/19/2023 @2pm  telephone  Preventive Care 40-64 Years, Female Preventive care refers to lifestyle choices and visits with your health care provider that can promote health and wellness. What does preventive care include? A yearly physical exam. This is also called an annual well check. Dental exams once or twice a year. Routine eye exams. Ask your health care provider how often you should have your eyes checked. Personal lifestyle choices, including: Daily care of your teeth and gums. Regular physical activity. Eating a healthy diet. Avoiding tobacco and drug use. Limiting alcohol use. Practicing safe sex. Taking low-dose aspirin daily starting at age 84. Taking vitamin and mineral supplements as recommended by your health care provider. What happens during an annual well check? The  services and screenings done by your health care provider during your annual well check will depend on your age, overall health, lifestyle risk factors, and family history of disease. Counseling  Your health care provider may ask you questions about your: Alcohol use. Tobacco use. Drug use. Emotional well-being. Home and relationship well-being. Sexual activity. Eating habits. Work and work Statistician. Method of birth control. Menstrual cycle. Pregnancy history. Screening  You may have the following tests or measurements: Height, weight, and BMI. Blood pressure. Lipid and cholesterol levels. These may be checked every 5 years, or more frequently if you are over 21 years old. Skin check. Lung cancer screening. You may have this screening every year starting at age 71 if you have a 30-pack-year history of smoking and currently smoke or have quit within the past 15 years. Fecal occult blood test (FOBT) of the stool. You may have this test every year starting at age 17. Flexible sigmoidoscopy or colonoscopy. You may have a sigmoidoscopy every 5 years or a colonoscopy every 10 years starting at age 17. Hepatitis C blood test. Hepatitis B blood test. Sexually transmitted disease (STD) testing. Diabetes screening. This is done by checking your blood sugar (glucose) after you have not eaten for a while (fasting). You may have this done every 1-3 years. Mammogram. This may be done every 1-2 years. Talk to your health care provider about when you should start having regular mammograms. This may depend on whether  you have a family history of breast cancer. BRCA-related cancer screening. This may be done if you have a family history of breast, ovarian, tubal, or peritoneal cancers. Pelvic exam and Pap test. This may be done every 3 years starting at age 62. Starting at age 48, this may be done every 5 years if you have a Pap test in combination with an HPV test. Bone density scan. This is done to  screen for osteoporosis. You may have this scan if you are at high risk for osteoporosis. Discuss your test results, treatment options, and if necessary, the need for more tests with your health care provider. Vaccines  Your health care provider may recommend certain vaccines, such as: Influenza vaccine. This is recommended every year. Tetanus, diphtheria, and acellular pertussis (Tdap, Td) vaccine. You may need a Td booster every 10 years. Zoster vaccine. You may need this after age 33. Pneumococcal 13-valent conjugate (PCV13) vaccine. You may need this if you have certain conditions and were not previously vaccinated. Pneumococcal polysaccharide (PPSV23) vaccine. You may need one or two doses if you smoke cigarettes or if you have certain conditions. Talk to your health care provider about which screenings and vaccines you need and how often you need them. This information is not intended to replace advice given to you by your health care provider. Make sure you discuss any questions you have with your health care provider. Document Released: 10/07/2015 Document Revised: 05/30/2016 Document Reviewed: 07/12/2015 Elsevier Interactive Patient Education  2017 [place] Prevention in the Home Falls can cause injuries. They can happen to people of all ages. There are many things you can do to make your home safe and to help prevent falls. What can I do on the outside of my home? Regularly fix the edges of walkways and driveways and fix any cracks. Remove anything that might make you trip as you walk through a door, such as a raised step or threshold. Trim any bushes or trees on the path to your home. Use bright outdoor lighting. Clear any walking paths of anything that might make someone trip, such as rocks or tools. Regularly check to see if handrails are loose or broken. Make sure that both sides of any steps have handrails. Any raised decks and porches should have guardrails on  the edges. Have any leaves, snow, or ice cleared regularly. Use sand or salt on walking paths during winter. Clean up any spills in your garage right away. This includes oil or grease spills. What can I do in the bathroom? Use night lights. Install grab bars by the toilet and in the tub and shower. Do not use towel bars as grab bars. Use non-skid mats or decals in the tub or shower. If you need to sit down in the shower, use a plastic, non-slip stool. Keep the floor dry. Clean up any water that spills on the floor as soon as it happens. Remove soap buildup in the tub or shower regularly. Attach bath mats securely with double-sided non-slip rug tape. Do not have throw rugs and other things on the floor that can make you trip. What can I do in the bedroom? Use night lights. Make sure that you have a light by your bed that is easy to reach. Do not use any sheets or blankets that are too big for your bed. They should not hang down onto the floor. Have a firm chair that has side arms. You can use this for  support while you get dressed. Do not have throw rugs and other things on the floor that can make you trip. What can I do in the kitchen? Clean up any spills right away. Avoid walking on wet floors. Keep items that you use a lot in easy-to-reach places. If you need to reach something above you, use a strong step stool that has a grab bar. Keep electrical cords out of the way. Do not use floor polish or wax that makes floors slippery. If you must use wax, use non-skid floor wax. Do not have throw rugs and other things on the floor that can make you trip. What can I do with my stairs? Do not leave any items on the stairs. Make sure that there are handrails on both sides of the stairs and use them. Fix handrails that are broken or loose. Make sure that handrails are as long as the stairways. Check any carpeting to make sure that it is firmly attached to the stairs. Fix any carpet that is loose  or worn. Avoid having throw rugs at the top or bottom of the stairs. If you do have throw rugs, attach them to the floor with carpet tape. Make sure that you have a light switch at the top of the stairs and the bottom of the stairs. If you do not have them, ask someone to add them for you. What else can I do to help prevent falls? Wear shoes that: Do not have high heels. Have rubber bottoms. Are comfortable and fit you well. Are closed at the toe. Do not wear sandals. If you use a stepladder: Make sure that it is fully opened. Do not climb a closed stepladder. Make sure that both sides of the stepladder are locked into place. Ask someone to hold it for you, if possible. Clearly mark and make sure that you can see: Any grab bars or handrails. First and last steps. Where the edge of each step is. Use tools that help you move around (mobility aids) if they are needed. These include: Canes. Walkers. Scooters. Crutches. Turn on the lights when you go into a dark area. Replace any light bulbs as soon as they burn out. Set up your furniture so you have a clear path. Avoid moving your furniture around. If any of your floors are uneven, fix them. If there are any pets around you, be aware of where they are. Review your medicines with your doctor. Some medicines can make you feel dizzy. This can increase your chance of falling. Ask your doctor what other things that you can do to help prevent falls. This information is not intended to replace advice given to you by your health care provider. Make sure you discuss any questions you have with your health care provider. Document Released: 07/07/2009 Document Revised: 02/16/2016 Document Reviewed: 10/15/2014 Elsevier Interactive Patient Education  2017 [employer].

## 2022-12-13 NOTE — Progress Notes (Signed)
I connected with  Heather Jefferson on 12/13/22 by a audio enabled telemedicine application and verified that I am speaking with the correct person using two identifiers.  Patient Location: Home  Provider Location: Office/Clinic  I discussed the limitations of evaluation and management by telemedicine. The patient expressed understanding and agreed to proceed.  Subjective:   Heather Jefferson is a 52 y.o. female who presents for Medicare Annual (Subsequent) preventive examination.  Review of Systems    Cardiac Risk Factors include: sedentary lifestyle;dyslipidemia;obesity (BMI >30kg/m2)     Objective:    Today's Vitals   12/13/22 1404 12/13/22 1405  Weight: 296 lb (134.3 kg)   Height: 5\' 2"  (1.575 m)   PainSc:  3    Body mass index is 54.14 kg/m.     12/13/2022    2:16 PM 12/07/2021    8:25 AM 12/06/2020    8:32 AM 01/29/2020    8:16 AM 12/03/2019    8:15 AM 11/28/2018    8:24 AM 11/24/2018    8:52 AM  Advanced Directives  Does Patient Have a Medical Advance Directive? No No No No No No   Would patient like information on creating a medical advance directive?  Yes (MAU/Ambulatory/Procedural Areas - Information given) Yes (MAU/Ambulatory/Procedural Areas - Information given) No - Patient declined Yes (MAU/Ambulatory/Procedural Areas - Information given) No - Patient declined      Information is confidential and restricted. Go to Review Flowsheets to unlock data.    Current Medications (verified) Outpatient Encounter Medications as of 12/13/2022  Medication Sig   albuterol (VENTOLIN HFA) 108 (90 Base) MCG/ACT inhaler Inhale into the lungs.   amLODipine (NORVASC) 5 MG tablet Take 5 mg by mouth daily.   ARIPiprazole (ABILIFY) 20 MG tablet Take 1 tablet (20 mg total) by mouth daily.   buPROPion (WELLBUTRIN XL) 150 MG 24 hr tablet Take 1 tablet (150 mg total) by mouth every morning.   calcitRIOL (ROCALTROL) 0.5 MCG capsule Take 0.5 mcg by mouth daily.   desvenlafaxine (PRISTIQ)  100 MG 24 hr tablet Take 1 tablet (100 mg total) by mouth daily.   hydroxychloroquine (PLAQUENIL) 200 MG tablet Take 1 tablet by mouth 2 (two) times daily.   liothyronine (CYTOMEL) 5 MCG tablet Take 5 mcg by mouth every morning.   metFORMIN (GLUCOPHAGE-XR) 500 MG 24 hr tablet Take 1 tablet by mouth once daily with breakfast   MIRENA, 52 MG, 20 MCG/24HR IUD    pantoprazole (PROTONIX) 40 MG tablet Take 40 mg by mouth 2 (two) times daily.   prazosin (MINIPRESS) 2 MG capsule Take 1 capsule (2 mg total) by mouth at bedtime.   prednisoLONE acetate (PRED FORTE) 1 % ophthalmic suspension Place 1 drop into the right eye daily.   pregabalin (LYRICA) 100 MG capsule Take 1 capsule (100 mg total) by mouth at bedtime.   SUMAtriptan (IMITREX) 20 MG/ACT nasal spray Place 1 spray (20 mg total) into the nose every 2 (two) hours as needed for migraine.   temazepam (RESTORIL) 30 MG capsule Take 1 capsule (30 mg total) by mouth at bedtime as needed for sleep.   topiramate (TOPAMAX) 25 MG tablet Take 1 tablet (25 mg total) by mouth daily.   ADVAIR HFA 230-21 MCG/ACT inhaler Inhale 2 puffs into the lungs 2 (two) times daily. (Patient not taking: Reported on 09/25/2022)   temazepam (RESTORIL) 30 MG capsule Take 1 capsule (30 mg total) by mouth at bedtime as needed for sleep.   No facility-administered encounter medications on  file as of 12/13/2022.    Allergies (verified) Amoxicillin-pot clavulanate, Losartan, and Metformin and related   History: Past Medical History:  Diagnosis Date   ADHD (attention deficit hyperactivity disorder)    Allergy    Anemia    Anxiety    Depression    Diabetes mellitus, type II ([place])    Fibromyalgia    Generalized anxiety disorder    Headache    HIV infection ([place])    Hypertension    Hypothyroidism    Major depressive disorder, recurrent episode, moderate (HCC)    Migraine with acute onset aura    Persistent disorder of initiating or maintaining sleep    PTSD (post-traumatic  stress disorder)    Restless leg    Seizure disorder ([place])    Thyroid disease    Vitamin D deficiency    Past Surgical History:  Procedure Laterality Date   COLONOSCOPY WITH PROPOFOL N/A 01/29/2020   Procedure: COLONOSCOPY WITH PROPOFOL;  Surgeon: Jonathon Bellows, MD;  Location: [facility] ENDOSCOPY;  Service: Gastroenterology;  Laterality: N/A;   EYE SURGERY     TUBAL LIGATION     Family History  Problem Relation Age of Onset   Hypertension Mother    Heart attack Mother    CAD Mother    Diabetes Father    Hypertension Father    Hypertension Sister    Anxiety disorder Brother    Depression Brother    Social History   Socioeconomic History   Marital status: Married    Spouse name: Heather Jefferson   Number of children: 2   Years of education: Not on file   Highest education level: 10th grade  Occupational History   Occupation: disabled  Tobacco Use   Smoking status: Never   Smokeless tobacco: Never  Vaping Use   Vaping Use: Never used  Substance and Sexual Activity   Alcohol use: Not Currently   Drug use: No   Sexual activity: Not Currently    Partners: Male    Birth control/protection: Other-see comments, I.U.D.    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   Social Determinants of Health   Financial Resource Strain: Low Risk  (12/13/2022)   Overall Financial Resource Strain (CARDIA)    Difficulty of Paying Living Expenses: Not hard at all  Food Insecurity: No Food Insecurity (12/13/2022)   Hunger Vital Sign    Worried About Running Out of Food in the Last Year: Never true    [place] in the Last Year: Never true  Transportation Needs: No Transportation Needs (12/13/2022)   PRAPARE - Hydrologist (Medical): No    Lack of Transportation (Non-Medical): No  Physical Activity: Inactive (12/13/2022)    Exercise Vital Sign    Days of Exercise per Week: 0 days    Minutes of Exercise per Session: 0 min  Stress: Stress Concern Present (12/13/2022)   [place]    Feeling of Stress : Rather much  Social Connections: Moderately Isolated (12/13/2022)   Social Connection and Isolation Panel [NHANES]    Frequency of Communication with Friends and Family: Twice a week    Frequency of Social Gatherings with Friends and Family: Never    Attends Religious Services: More than 4 times per year  Active Member of Clubs or Organizations: No    Attends Archivist Meetings: Never    Marital Status: Married    Tobacco Counseling Counseling given: Not Answered   Clinical Intake:  Pre-visit preparation completed: Yes  Pain : 0-10 Pain Score: 3  Pain Type: Chronic pain Pain Location: Generalized Pain Descriptors / Indicators: Aching (stiffness) Pain Onset: More than a month ago Pain Frequency: Constant Pain Relieving Factors: lying down and napping  Pain Relieving Factors: lying down and napping  BMI - recorded: 54.14 Nutritional Status: BMI > 30  Obese Nutritional Risks: None Diabetes: No  How often do you need to have someone help you when you read instructions, pamphlets, or other written materials from your doctor or pharmacy?: 1 - Never  Diabetic?no  Interpreter Needed?: No  Comments: lives with husband and two children Information entered by :: B.Brennden Masten,LPN   Activities of Daily Living    12/13/2022    2:18 PM 09/25/2022    7:56 AM  In your present state of health, do you have any difficulty performing the following activities:  Hearing? 0 0  Vision? 0 0  Difficulty concentrating or making decisions? 1 0  Comment concentrating difficulties   Walking or climbing stairs? 1 1  Dressing or bathing? 0 0  Doing errands, shopping? 1 1  Preparing Food and eating ? N   Using the Toilet? N   In the  past six months, have you accidently leaked urine? N   Do you have problems with loss of bowel control? N   Managing your Medications? N   Managing your Finances? N   Housekeeping or managing your Housekeeping? N     Patient Care Team: Steele Sizer, MD as PCP - General [place], Rachel Bo, LCSW as Social Worker (Licensed Clinical Social Worker) Posey Pronto, Ninfa Meeker, MD as Consulting Physician (Rheumatology) Murlean Iba, MD (Nephrology) Norman Clay, MD as Consulting Physician (Psychiatry) Ottie Glazier, MD as Consulting Physician (Pulmonary Disease) Ronnald Collum, Lourdes Sledge, MD as Attending Physician (Endocrinology)  Indicate any recent Medical Services you may have received from other than [facility] providers in the past year (date may be approximate).     Assessment:   This is a routine wellness examination for Heather Jefferson.  Hearing/Vision screen Hearing Screening - Comments:: Adequate hearing Vision Screening - Comments:: Adequate vision w/glasses [facility]  Dietary issues and exercise activities discussed: Current Exercise Habits: The patient does not participate in regular exercise at present, Exercise limited by: neurologic condition(s);psychological condition(s);orthopedic condition(s)   Goals Addressed   None    Depression Screen    12/13/2022    2:12 PM 09/25/2022    7:56 AM 08/09/2022    8:34 AM 08/01/2022    9:31 AM 07/03/2022   10:15 AM 06/20/2022    9:11 AM 06/07/2022    8:27 AM  PHQ 2/9 Scores  PHQ - 2 Score 5 5  0 5 5   PHQ- 9 Score 12 12  0 12 18      Information is confidential and restricted. Go to Review Flowsheets to unlock data.    Fall Risk    12/13/2022    2:08 PM 09/25/2022    7:56 AM 08/01/2022    9:31 AM 07/03/2022   10:13 AM 06/20/2022    9:11 AM  Fall Risk   Falls in the past year? 0 0 0 0 0  Number falls in past yr: 0 0 0    Injury with Fall? 0 0 0  Risk for fall due to : No Fall Risks No Fall Risks  No Fall Risks No Fall Risks  Follow up  Education provided;Falls prevention discussed Falls prevention discussed  Falls prevention discussed;Falls evaluation completed;Education provided Falls prevention discussed;Education provided    FALL RISK PREVENTION PERTAINING TO THE HOME:  Any stairs in or around the home? Yes  If so, are there any without handrails? No  Home free of loose throw rugs in walkways, pet beds, electrical cords, etc? Yes  Adequate lighting in your home to reduce risk of falls? Yes   ASSISTIVE DEVICES UTILIZED TO PREVENT FALLS:  Life alert? No  Use of a cane, walker or w/c? No  Grab bars in the bathroom? No  Shower chair or bench in shower? No  Elevated toilet seat or a handicapped toilet? Yes   Cognitive Function:        12/13/2022    2:25 PM 12/03/2019    8:23 AM 11/28/2018    8:30 AM  6CIT Screen  What Year? 0 points 0 points 0 points  What month? 0 points 0 points 0 points  What time? 0 points 0 points 0 points  Count back from 20 0 points 0 points 0 points  Months in reverse 0 points 0 points 0 points  Repeat phrase 0 points 4 points 0 points  Total Score 0 points 4 points 0 points    Immunizations Immunization History  Administered Date(s) Administered   Influenza,inj,Quad PF,6+ Mos 05/17/2015, 10/16/2017, 10/08/2018, 08/04/2019, 08/12/2020, 05/23/2021, 06/20/2022   Influenza-Unspecified 07/14/2014   PFIZER(Purple Top)SARS-COV-2 Vaccination 12/10/2019, 12/31/2019, 09/02/2020   Pneumococcal Polysaccharide-23 05/25/2013   Tdap 12/30/2009, 09/30/2018   Zoster Recombinat (Shingrix) 06/13/2021, 12/21/2021    TDAP status: Up to date  Flu Vaccine status: Up to date  Pneumococcal vaccine status: Up to date  Covid-19 vaccine status: Completed vaccines  Qualifies for Shingles Vaccine? Yes   Zostavax completed Yes   Shingrix Completed?: Yes  Screening Tests Health Maintenance  Topic Date Due   MAMMOGRAM  10/09/2019   COVID-19 Vaccine (4 - 2023-24 season) 05/25/2022   PAP  SMEAR-Modifier  07/09/2023   Medicare Annual Wellness (AWV)  12/13/2023   COLONOSCOPY (Pts 45-62yrs Insurance coverage will need to be confirmed)  01/28/2025   DTaP/Tdap/Td (3 - Td or Tdap) 09/30/2028   INFLUENZA VACCINE  Completed   Hepatitis C Screening  Completed   HIV Screening  Completed   Zoster Vaccines- Shingrix  Completed   HPV VACCINES  Aged Out    Health Maintenance  Health Maintenance Due  Topic Date Due   MAMMOGRAM  10/09/2019   COVID-19 Vaccine (4 - 2023-24 season) 05/25/2022    Colorectal cancer screening: Type of screening: Colonoscopy. Completed yes. Repeat every 5 years  Mammogram status: Ordered yes. Pt provided with contact info and advised to call to schedule appt.   Lung Cancer Screening: (Low Dose CT Chest recommended if Age 12-80 years, 30 pack-year currently smoking OR have quit w/in 15years.) does not qualify.   Lung Cancer Screening Referral: no  Additional Screening:  Hepatitis C Screening: does not qualify; Completed no  Vision Screening: Recommended annual ophthalmology exams for early detection of glaucoma and other disorders of the eye. Is the patient up to date with their annual eye exam?  Yes  Who is the provider or what is the name of the office in which the patient attends annual eye exams? [facility] If pt is not established with a provider, would they like to be  referred to a provider to establish care? No .   Dental Screening: Recommended annual dental exams for proper oral hygiene  Community Resource Referral / Chronic Care Management: CRR required this visit?  No   CCM required this visit?  No      Plan:     I have personally reviewed and noted the following in the patient's chart:   Medical and social history Use of alcohol, tobacco or illicit drugs  Current medications and supplements including opioid prescriptions. Patient is not currently taking opioid prescriptions. Functional ability and status Nutritional  status Physical activity Advanced directives List of other physicians Hospitalizations, surgeries, and ER visits in previous 12 months Vitals Screenings to include cognitive, depression, and falls Referrals and appointments  In addition, I have reviewed and discussed with patient certain preventive protocols, quality metrics, and best practice recommendations. A written personalized care plan for preventive services as well as general preventive health recommendations were provided to patient.     Roger Shelter, LPN   [id number]   Nurse Notes: pt remains functioning at her baseline of generalized pain due to fibromyalgia. Pt sts she could use help with chores but is alright for now. She has no concerns or questions during this visit.

## 2022-12-15 ENCOUNTER — Other Ambulatory Visit: Payer: Self-pay | Admitting: Family Medicine

## 2022-12-15 DIAGNOSIS — E88819 Insulin resistance, unspecified: Secondary | ICD-10-CM

## 2022-12-18 DIAGNOSIS — E039 Hypothyroidism, unspecified: Secondary | ICD-10-CM | POA: Diagnosis not present

## 2022-12-18 DIAGNOSIS — E559 Vitamin D deficiency, unspecified: Secondary | ICD-10-CM | POA: Diagnosis not present

## 2022-12-18 DIAGNOSIS — E213 Hyperparathyroidism, unspecified: Secondary | ICD-10-CM | POA: Diagnosis not present

## 2022-12-18 DIAGNOSIS — E7800 Pure hypercholesterolemia, unspecified: Secondary | ICD-10-CM | POA: Diagnosis not present

## 2022-12-18 DIAGNOSIS — I10 Essential (primary) hypertension: Secondary | ICD-10-CM | POA: Diagnosis not present

## 2022-12-19 ENCOUNTER — Ambulatory Visit: Payer: 59 | Admitting: Family Medicine
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
## 2022-12-21 ENCOUNTER — Encounter: Payer: Self-pay | Admitting: Family Medicine

## 2022-12-21 ENCOUNTER — Ambulatory Visit (INDEPENDENT_AMBULATORY_CARE_PROVIDER_SITE_OTHER): Payer: 59 | Admitting: Family Medicine

## 2022-12-21 VITALS — BP 132/80 | HR 87 | Temp 98.0°F | Resp 16 | Ht 62.0 in | Wt 291.4 lb

## 2022-12-21 DIAGNOSIS — E559 Vitamin D deficiency, unspecified: Secondary | ICD-10-CM | POA: Diagnosis not present

## 2022-12-21 DIAGNOSIS — M059 Rheumatoid arthritis with rheumatoid factor, unspecified: Secondary | ICD-10-CM

## 2022-12-21 DIAGNOSIS — E785 Hyperlipidemia, unspecified: Secondary | ICD-10-CM

## 2022-12-21 DIAGNOSIS — E88819 Insulin resistance, unspecified: Secondary | ICD-10-CM | POA: Insufficient documentation

## 2022-12-21 DIAGNOSIS — Z947 Corneal transplant status: Secondary | ICD-10-CM | POA: Diagnosis not present

## 2022-12-21 DIAGNOSIS — I10 Essential (primary) hypertension: Secondary | ICD-10-CM

## 2022-12-21 DIAGNOSIS — Z1231 Encounter for screening mammogram for malignant neoplasm of breast: Secondary | ICD-10-CM

## 2022-12-21 DIAGNOSIS — M797 Fibromyalgia: Secondary | ICD-10-CM | POA: Diagnosis not present

## 2022-12-21 DIAGNOSIS — R053 Chronic cough: Secondary | ICD-10-CM | POA: Diagnosis not present

## 2022-12-21 DIAGNOSIS — G43009 Migraine without aura, not intractable, without status migrainosus: Secondary | ICD-10-CM | POA: Diagnosis not present

## 2022-12-21 DIAGNOSIS — F331 Major depressive disorder, recurrent, moderate: Secondary | ICD-10-CM | POA: Diagnosis not present

## 2022-12-21 DIAGNOSIS — J849 Interstitial pulmonary disease, unspecified: Secondary | ICD-10-CM | POA: Insufficient documentation

## 2022-12-21 MED ORDER — PREGABALIN 100 MG PO CAPS: 100.0000 mg | ORAL_CAPSULE | Freq: Every day | ORAL | 1 refills | Status: DC

## 2022-12-21 MED ORDER — METFORMIN HCL ER 500 MG PO TB24: 500.0000 mg | ORAL_TABLET | Freq: Every day | ORAL | 1 refills | Status: DC

## 2022-12-21 MED ORDER — SUMATRIPTAN 20 MG/ACT NA SOLN: 20.0000 mg | NASAL | 2 refills | Status: DC | PRN

## 2022-12-21 MED ORDER — METFORMIN HCL ER 750 MG PO TB24: 750.0000 mg | ORAL_TABLET | Freq: Every day | ORAL | 1 refills | Status: DC
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
## 2022-12-23 ENCOUNTER — Other Ambulatory Visit: Payer: Self-pay | Admitting: Psychiatry

## 2022-12-23 DIAGNOSIS — F331 Major depressive disorder, recurrent, moderate: Secondary | ICD-10-CM

## 2022-12-25 ENCOUNTER — Ambulatory Visit (INDEPENDENT_AMBULATORY_CARE_PROVIDER_SITE_OTHER): Payer: 59 | Admitting: Psychiatry

## 2022-12-25 ENCOUNTER — Encounter: Payer: Self-pay | Admitting: Psychiatry

## 2022-12-25 VITALS — BP 134/88 | HR 72 | Temp 97.7°F | Ht 62.0 in | Wt 296.8 lb

## 2022-12-25 DIAGNOSIS — F4312 Post-traumatic stress disorder, chronic: Secondary | ICD-10-CM

## 2022-12-25 DIAGNOSIS — K2100 Gastro-esophageal reflux disease with esophagitis, without bleeding: Secondary | ICD-10-CM | POA: Diagnosis not present

## 2022-12-25 DIAGNOSIS — G4700 Insomnia, unspecified: Secondary | ICD-10-CM | POA: Diagnosis not present

## 2022-12-25 DIAGNOSIS — I10 Essential (primary) hypertension: Secondary | ICD-10-CM | POA: Diagnosis not present

## 2022-12-25 DIAGNOSIS — E7800 Pure hypercholesterolemia, unspecified: Secondary | ICD-10-CM | POA: Diagnosis not present

## 2022-12-25 DIAGNOSIS — R635 Abnormal weight gain: Secondary | ICD-10-CM | POA: Diagnosis not present

## 2022-12-25 DIAGNOSIS — M069 Rheumatoid arthritis, unspecified: Secondary | ICD-10-CM | POA: Diagnosis not present

## 2022-12-25 DIAGNOSIS — F331 Major depressive disorder, recurrent, moderate: Secondary | ICD-10-CM | POA: Diagnosis not present

## 2022-12-25 DIAGNOSIS — E039 Hypothyroidism, unspecified: Secondary | ICD-10-CM | POA: Diagnosis not present

## 2022-12-25 MED ORDER — TEMAZEPAM 30 MG PO CAPS: 30.0000 mg | ORAL_CAPSULE | Freq: Every evening | ORAL | 0 refills | Status: DC | PRN

## 2022-12-25 MED ORDER — ARIPIPRAZOLE 20 MG PO TABS: 20.0000 mg | ORAL_TABLET | Freq: Every day | ORAL | 1 refills | Status: DC

## 2022-12-25 MED ORDER — BUPROPION HCL ER (XL) 150 MG PO TB24: 150.0000 mg | ORAL_TABLET | Freq: Every morning | ORAL | 5 refills | Status: DC

## 2022-12-25 NOTE — Patient Instructions (Signed)
Continue Pristiq 100 mg daily Continue bupropion 150 mg daily  Continue Abilify 20 mg daily  Continue topiramate 25 mg daily  Continue prazosin 2 mg at night Continue temazepam 30 mg at night as needed for insomnia.  Next appointment: 5/15 at 10:30

## 2023-01-01 ENCOUNTER — Encounter: Payer: Self-pay | Admitting: Family Medicine

## 2023-01-01 DIAGNOSIS — M0609 Rheumatoid arthritis without rheumatoid factor, multiple sites: Secondary | ICD-10-CM | POA: Diagnosis not present

## 2023-01-01 DIAGNOSIS — Z79899 Other long term (current) drug therapy: Secondary | ICD-10-CM | POA: Diagnosis not present

## 2023-01-01 DIAGNOSIS — M797 Fibromyalgia: Secondary | ICD-10-CM | POA: Diagnosis not present

## 2023-01-02 ENCOUNTER — Other Ambulatory Visit: Payer: Self-pay | Admitting: Psychiatry

## 2023-01-02 DIAGNOSIS — G4733 Obstructive sleep apnea (adult) (pediatric): Secondary | ICD-10-CM | POA: Diagnosis not present

## 2023-01-02 DIAGNOSIS — F4312 Post-traumatic stress disorder, chronic: Secondary | ICD-10-CM

## 2023-01-02 DIAGNOSIS — F331 Major depressive disorder, recurrent, moderate: Secondary | ICD-10-CM

## 2023-01-02 MED ORDER — DESVENLAFAXINE SUCCINATE ER 100 MG PO TB24: 100.0000 mg | ORAL_TABLET | Freq: Every day | ORAL | 1 refills | Status: DC

## 2023-01-02 NOTE — Telephone Encounter (Signed)
pt called states that she needs a refill on the pristiq. last seen on 12-25-22      desvenlafaxine (PRISTIQ) 100 MG 24 hr tablet Medication Expired Date: 06/07/2022 Department: [facility] Ordering/Authorizing: [name], MD   Order Providers  Prescribing Provider Encounter Provider  [name], MD [name], MD   Outpatient Medication Detail   Disp Refills Start End   desvenlafaxine (PRISTIQ) 100 MG 24 hr tablet 30 tablet 5 07/02/2022 12/29/2022   Sig - Route: Take 1 tablet (100 mg total) by mouth daily. - Oral   Sent to pharmacy as: desvenlafaxine (PRISTIQ) 100 MG 24 hr tablet   Notes to Pharmacy: Fill after 10/3   E-Prescribing Status: Receipt confirmed by pharmacy (06/07/2022  8:32 AM EDT)

## 2023-01-02 NOTE — Telephone Encounter (Signed)
Ordered

## 2023-01-11 ENCOUNTER — Ambulatory Visit (INDEPENDENT_AMBULATORY_CARE_PROVIDER_SITE_OTHER): Payer: 59 | Admitting: Licensed Clinical Social Worker

## 2023-01-11 DIAGNOSIS — F4312 Post-traumatic stress disorder, chronic: Secondary | ICD-10-CM | POA: Diagnosis not present

## 2023-01-11 DIAGNOSIS — F331 Major depressive disorder, recurrent, moderate: Secondary | ICD-10-CM

## 2023-01-11 NOTE — Progress Notes (Signed)
Virtual Visit via Video Note  I connected with Heather Jefferson on 01/11/23 at 11:00 AM EDT by a video enabled telemedicine application and verified that I am speaking with the correct person using two identifiers.  Location: Patient: home Provider: remote office [place], [place])  I discussed the limitations of evaluation and management by telemedicine and the availability of in person appointments. The patient expressed understanding and agreed to proceed.  I discussed the assessment and treatment plan with the patient. The patient was provided an opportunity to ask questions and all were answered. The patient agreed with the plan and demonstrated an understanding of the instructions.   The patient was advised to call back or seek an in-person evaluation if the symptoms worsen or if the condition fails to improve as anticipated.  I provided 35 minutes of non-face-to-face time during this encounter.   Dhanya Bogle R Genevie Elman, LCSW   THERAPIST PROGRESS NOTE  Session Time: 07-1134 p  Participation Level: Active  Behavioral Response: Neat and Well GroomedAlertDepressed  Type of Therapy: Individual Therapy  Treatment Goals addressed: Develop healthy thinking patterns and beliefs about self, others, and the world that lead to the alleviation and help prevent the relapse of depression per self report 3 out of 5 sessions documented   Reduction in intrusive event recollections, avoidance of event reminders, intense arousal, or disinterest in activities or relationships. as evidenced by pt self report 3 out of 5 sessions documented   ProgressTowards Goals: Progressing  Interventions:  Intervention: Trauma focused CBT; Supportive therapy  Summary: Heather Jefferson is a 52 y.o. female who presents with symptoms consistent with PTSD and depression.  Patient reports that she has been experiencing flashbacks and nightmares recently.  Reviewed strategies of managing flashbacks, and reviewed  sleep hygiene tips for when patient wakes up with anxiety/panic from nightmares.  Clinician assisted pt with identifying situations/scenarios/schemas triggering anxiety and/or depression symptoms. Allowed pt to explore and express thoughts and feelings and discussed current coping mechanisms. Reviewed changes/recommendations.  Patient states that she saw her mother and her sister when visiting for Easter.  Patient reports that it was a very good family gathering, and that patient and her sister bonded during that time.  Patient states that her sister expressed worry about patient, and offered her help at any point in time.  Encouraged patient to reach out to family members that have offered support if she does have the need.  Patient reflects understanding.  Patient states that her sister wants to take a beach trip with her soon.  Patient states that she downloaded the PTSD phone application and the Bible phone application.  Patient states that she has not explored either one of them yet, but will soon.  Patient made the statement that she wanted an easier access to the Bible in biblical verses.  Patient states that her relationships with husband and family members are good.  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  Patient reports that she is managing her husband's diabetes, so she is doing all of the cooking, and checking his blood sugar on a regular basis.  Use motivational interviewing to recommend physical activity for patient and husband.  Patient identified a recent panic attack when she was in the car with her son-allowed patient to explore the situation, and explore specific triggers.  Allow patient to identify coping skills that helps her manage through the panic attack at that time.  Patient reports that her son  was with her, and did a good job of coaching her through it.  Patient identified this time of year (around her birthday) as  the time of year that she told her mother she was pregnant with her daughter that was given up for adoption.  Patient states that she is feeling more depressed these days, feels low motivation, feels low initiative, and is having moments of hypersomnia.  Patient reports that she is sleeping more than usual, but feels that it is because she is so exhausted after being very busy with her daughter the last 2 weeks.  Encouraged patient to continue keeping an eye on her activity, and if the she is sleeping more than 50% of her day, she needs to increase overall stimulating activities.  Patient reflects understanding and will monitor her behaviors.  Patient is excited about a recent tik tok video that she made searching for her daughter that was given up for adoption.  Allowed patient safe space to explore her thoughts and feelings associated with this.  Continued recommendations are as follows: self care behaviors, positive social engagements, focusing on overall work/home/life balance, and focusing on positive physical and emotional wellness.   Suicidal/Homicidal: No  Therapist Response:  Pt is continuing to apply interventions learned in session into daily life situations. Pt is currently on track to meet goals utilizing interventions mentioned above. Personal growth and progress noted. Treatment to continue as indicated.   Plan: Return again in 4 weeks.  Encounter Diagnoses  Name Primary?   Chronic post-traumatic stress disorder (PTSD) Yes   MDD (major depressive disorder), recurrent episode, moderate     Collaboration of Care: Other Pt encouraged to continue with psychiatrist of record, Dr. Vanetta Shawl  Patient/Guardian was advised Release of Information must be obtained prior to any record release in order to collaborate their care with an outside provider. Patient/Guardian was advised if they have not already done so to contact the registration department to sign all necessary forms in order for [place]  to release information regarding their care.   Consent: Patient/Guardian gives verbal consent for treatment and assignment of benefits for services provided during this visit. Patient/Guardian expressed understanding and agreed to proceed.   Portions of this report may have been transcribed using voice recognition software. Every effort was made to ensure accuracy; however, inadvertent computerized transcription errors may be present    Rozanna Box, LCSW 01/11/2023

## 2023-01-28 DIAGNOSIS — G4733 Obstructive sleep apnea (adult) (pediatric): Secondary | ICD-10-CM | POA: Diagnosis not present

## 2023-01-28 DIAGNOSIS — J849 Interstitial pulmonary disease, unspecified: Secondary | ICD-10-CM | POA: Diagnosis not present

## 2023-01-28 DIAGNOSIS — N2581 Secondary hyperparathyroidism of renal origin: Secondary | ICD-10-CM | POA: Diagnosis not present

## 2023-01-28 DIAGNOSIS — I10 Essential (primary) hypertension: Secondary | ICD-10-CM | POA: Diagnosis not present

## 2023-01-29 DIAGNOSIS — I10 Essential (primary) hypertension: Secondary | ICD-10-CM | POA: Diagnosis not present

## 2023-01-29 DIAGNOSIS — N2581 Secondary hyperparathyroidism of renal origin: Secondary | ICD-10-CM | POA: Diagnosis not present

## 2023-02-01 DIAGNOSIS — G4733 Obstructive sleep apnea (adult) (pediatric): Secondary | ICD-10-CM | POA: Diagnosis not present

## 2023-02-13 DIAGNOSIS — G4733 Obstructive sleep apnea (adult) (pediatric): Secondary | ICD-10-CM | POA: Diagnosis not present

## 2023-02-26 ENCOUNTER — Other Ambulatory Visit: Payer: Self-pay | Admitting: Psychiatry

## 2023-02-26 ENCOUNTER — Telehealth: Payer: Self-pay

## 2023-02-26 DIAGNOSIS — F331 Major depressive disorder, recurrent, moderate: Secondary | ICD-10-CM

## 2023-02-26 DIAGNOSIS — F4312 Post-traumatic stress disorder, chronic: Secondary | ICD-10-CM

## 2023-02-26 MED ORDER — DESVENLAFAXINE SUCCINATE ER 100 MG PO TB24: 100.0000 mg | ORAL_TABLET | Freq: Every day | ORAL | 1 refills | Status: DC

## 2023-02-26 NOTE — Telephone Encounter (Signed)
received fax request for refill on the pristiq. pt was last seen on 4-2 next appt 6-25     Disp Refills Start End    desvenlafaxine (PRISTIQ) 100 MG 24 hr tablet 30 tablet 1 01/02/2023 03/03/2023   Sig - Route: Take 1 tablet (100 mg total) by mouth daily. - Oral   Sent to pharmacy as: desvenlafaxine (PRISTIQ) 100 MG 24 hr tablet   E-Prescribing Status: Receipt confirmed by pharmacy (01/02/2023  5:15 PM EDT)

## 2023-02-26 NOTE — Telephone Encounter (Signed)
Ordered

## 2023-02-27 NOTE — Telephone Encounter (Signed)
Pt.notified

## 2023-03-04 ENCOUNTER — Other Ambulatory Visit: Payer: Self-pay | Admitting: Psychiatry

## 2023-03-04 DIAGNOSIS — G4733 Obstructive sleep apnea (adult) (pediatric): Secondary | ICD-10-CM | POA: Diagnosis not present

## 2023-03-05 ENCOUNTER — Ambulatory Visit (INDEPENDENT_AMBULATORY_CARE_PROVIDER_SITE_OTHER): Payer: 59 | Admitting: Licensed Clinical Social Worker

## 2023-03-05 DIAGNOSIS — F4312 Post-traumatic stress disorder, chronic: Secondary | ICD-10-CM | POA: Diagnosis not present
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
## 2023-03-18 ENCOUNTER — Other Ambulatory Visit: Payer: Self-pay | Admitting: Psychiatry

## 2023-03-18 ENCOUNTER — Telehealth: Payer: Self-pay | Admitting: Psychiatry

## 2023-03-18 MED ORDER — TEMAZEPAM 30 MG PO CAPS: 30.0000 mg | ORAL_CAPSULE | Freq: Every evening | ORAL | 1 refills | Status: DC | PRN

## 2023-03-18 NOTE — Telephone Encounter (Signed)
Patient stated she has been out of her temazepam for 2 weeks and needs a refill. Patient's next appointment is 8/16 and is on waitlist.

## 2023-03-18 NOTE — Telephone Encounter (Signed)
received notifice from pharmacy for a refill on the temazepam 30mg  pt last seen on 4-2 next appt 8-16

## 2023-03-18 NOTE — Telephone Encounter (Signed)
Ordered

## 2023-03-19 ENCOUNTER — Telehealth: Payer: 59 | Admitting: Psychiatry

## 2023-03-21 ENCOUNTER — Other Ambulatory Visit: Payer: Self-pay | Admitting: Psychiatry

## 2023-03-21 ENCOUNTER — Telehealth (INDEPENDENT_AMBULATORY_CARE_PROVIDER_SITE_OTHER): Payer: 59 | Admitting: Psychiatry

## 2023-03-21 ENCOUNTER — Encounter: Payer: Self-pay | Admitting: Psychiatry

## 2023-03-21 DIAGNOSIS — F4312 Post-traumatic stress disorder, chronic: Secondary | ICD-10-CM

## 2023-03-21 DIAGNOSIS — R635 Abnormal weight gain: Secondary | ICD-10-CM

## 2023-03-21 DIAGNOSIS — F331 Major depressive disorder, recurrent, moderate: Secondary | ICD-10-CM

## 2023-03-21 DIAGNOSIS — G4700 Insomnia, unspecified: Secondary | ICD-10-CM

## 2023-03-21 MED ORDER — ARIPIPRAZOLE 20 MG PO TABS
20.0000 mg | ORAL_TABLET | Freq: Every day | ORAL | 1 refills | Status: DC
Start: 2023-03-21 — End: 2023-05-17
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
## 2023-03-21 NOTE — Patient Instructions (Signed)
Continue Pristiq 100 mg daily Continue bupropion 150 mg daily  Continue Abilify 20 mg daily  Continue topiramate 25 mg daily  Continue prazosin 2 mg at night Continue temazepam 30 mg at night as needed for insomnia.  Next appointment: 8/23 at 10 am

## 2023-04-03 DIAGNOSIS — G4733 Obstructive sleep apnea (adult) (pediatric): Secondary | ICD-10-CM | POA: Diagnosis not present

## 2023-04-08 ENCOUNTER — Ambulatory Visit (INDEPENDENT_AMBULATORY_CARE_PROVIDER_SITE_OTHER): Payer: 59 | Admitting: Licensed Clinical Social Worker

## 2023-04-08 DIAGNOSIS — F4312 Post-traumatic stress disorder, chronic: Secondary | ICD-10-CM | POA: Diagnosis not present

## 2023-04-08 DIAGNOSIS — F331 Major depressive disorder, recurrent, moderate: Secondary | ICD-10-CM

## 2023-04-08 NOTE — Progress Notes (Signed)
Virtual Visit via Video Note  I connected with Heather Jefferson on 04/08/23 at  9:00 AM EDT by a video enabled telemedicine application and verified that I am speaking with the correct person using two identifiers.  Location: Patient: home Provider: remote office [place], [place])  I discussed the limitations of evaluation and management by telemedicine and the availability of in person appointments. The patient expressed understanding and agreed to proceed.  I discussed the assessment and treatment plan with the patient. The patient was provided an opportunity to ask questions and all were answered. The patient agreed with the plan and demonstrated an understanding of the instructions.   The patient was advised to call back or seek an in-person evaluation if the symptoms worsen or if the condition fails to improve as anticipated.  I provided 37 minutes of non-face-to-face time during this encounter.   Lizzette Carbonell R Makaylia Hewett, LCSW   THERAPIST PROGRESS NOTE  Session Time: [phone]  Participation Level: Active  Behavioral Response: Neat and Well GroomedAlertDepressed  Type of Therapy: Individual Therapy  Treatment Goals addressed: Develop healthy thinking patterns and beliefs about self, others, and the world that lead to the alleviation and help prevent the relapse of depression per self report 3 out of 5 sessions documented   Reduction in intrusive event recollections, avoidance of event reminders, intense arousal, or disinterest in activities or relationships. as evidenced by pt self report 3 out of 5 sessions documented   ProgressTowards Goals: Progressing  Interventions:  Intervention: Trauma focused CBT; Supportive therapy  Summary: Heather Jefferson is a 52 y.o. female who presents with symptoms consistent with PTSD and depression.   Clinician assisted pt with identifying situations/scenarios/schemas triggering anxiety and/or depression symptoms. Reviewed coping skills  for managing depression and anxiety.    Assisted pt with identifying stress/anxiety triggered by trauma. Pt feels that she is continuing to have nightmares and flashbacks about her daughter. Pt made the comment "I feel stuck like I can't move forward". Explored memories from the past and assisted pt with identifying emotions associated with the memories. Discussed relativity of thoughts, behaviors, and emotions. Discussed EMDR trauma therapy and encouraged pt to reach out for more intensive treatment. Pt seems ambivalent.   Explored pts caregiver stress-pt is the primary caregiver of her husband, who is experiencing memory loss. Encouraged pt to reach out for community support groups and to understand the condition and her role as caregiver. Provided pt with unconditional support and encouraged pt to focus on self care.  Continued recommendations are as follows: self care behaviors, positive social engagements, focusing on overall work/home/life balance, and focusing on positive physical and emotional wellness.   Suicidal/Homicidal: No  Therapist Response:  Pt is continuing to apply interventions learned in session into daily life situations. Pt is currently on track to meet goals utilizing interventions mentioned above. Personal growth and progress noted. Treatment to continue as indicated.   Plan: Informed patient that clinician will be leaving outpatient department. Allowed pt to explore any questions or concerns and discussed future counseling options/resources. Provided pt with psychoeducational resources and list of OPT therapists. Encouraged pt to continue with psychiatric med management appointments, if applicable.   Encouraged pt to reach out to PCP or insurance company for list of clinicians that practice EMDR.  Encounter Diagnoses  Name Primary?   Chronic post-traumatic stress disorder (PTSD) Yes   MDD (major depressive disorder), recurrent episode, moderate (HCC)    Collaboration of  Care: Other Pt encouraged to continue with psychiatrist  of record, Dr. Vanetta Shawl  Patient/Guardian was advised Release of Information must be obtained prior to any record release in order to collaborate their care with an outside provider. Patient/Guardian was advised if they have not already done so to contact the registration department to sign all necessary forms in order for Heather Jefferson to release information regarding their care.   Consent: Patient/Guardian gives verbal consent for treatment and assignment of benefits for services provided during this visit. Patient/Guardian expressed understanding and agreed to proceed.   Portions of this report may have been transcribed using voice recognition software. Every effort was made to ensure accuracy; however, inadvertent computerized transcription errors may be present   Rozanna Box, LCSW 04/08/2023

## 2023-04-08 NOTE — Patient Instructions (Signed)
Outpatient Psychiatry and Counseling  FOR CRISIS:  call 911, [facility]: Mobile Crisis Management 24 hours:  [phone], call [phone], [facility] ([facility] [facility]) [address] walk in, or go to your local EMERGENCY DEPARTMENT  [facility] [address], [place], [place] [address]  [phone]  [facility] [address] [place], [place] [address] [phone]  [facility] [address] [place],  [place]  [address] [phone]  [facility] Address: [address] [place], [place] [address] Phone: [phone]  [facility] [place] and [place] Locations) [email or web address]  [employer] [facility] [employer] fee and walk in schedule: M-F 8am-12pm/1pm-3pm [address]  [place], [place] [address] [phone]  [facility] [address] [place], [place] [address] [phone]  [facility] Outpatient Services/ Intensive Outpatient Therapy Program/CDIOP/PHP [address] [place], [place] [address] [phone]  [facility]gent [facility], Outpatient Therapy Services, [place]      [phone]     [address]    [place], [place] [address]                 [facility]   [facility] [phone]. [address] [place], [place] [address]  [facility]          [address]  [place], [place] [address]       [phone]  [facility] [address] [place], [place] [address] [phone]  [facility]    [address]    [place], [place] [address]     [phone]       [facility] [address] [place] [address]  [facility]     [address] [place], [place]      [phone]       [facility] Eulogio Ditch, MD [address] [place], [place] [address] [phone]  [facility]     [address]     [place], [place] [address]     [phone]       [facility] [address] [place], [place] [address] [phone] Resources for Temporary Residential Assistance/Crisis Centers

## 2023-04-17 ENCOUNTER — Other Ambulatory Visit: Payer: Self-pay | Admitting: Psychiatry

## 2023-04-26 ENCOUNTER — Other Ambulatory Visit: Payer: Self-pay | Admitting: Psychiatry

## 2023-04-26 ENCOUNTER — Telehealth: Payer: Self-pay | Admitting: Psychiatry

## 2023-04-26 DIAGNOSIS — F331 Major depressive disorder, recurrent, moderate: Secondary | ICD-10-CM

## 2023-04-26 DIAGNOSIS — F4312 Post-traumatic stress disorder, chronic: Secondary | ICD-10-CM

## 2023-04-26 MED ORDER — DESVENLAFAXINE SUCCINATE ER 100 MG PO TB24
100.0000 mg | ORAL_TABLET | Freq: Every day | ORAL | 0 refills | Status: DC
Start: 2023-05-02 — End: 2023-05-31

## 2023-04-26 NOTE — Telephone Encounter (Signed)
Patient called stating she needs a refill for pristiq 100 mg to be sent to her [facility]. Patient's next appointment is August 23.-Please advise

## 2023-04-26 NOTE — Telephone Encounter (Signed)
Pt.notified

## 2023-04-26 NOTE — Telephone Encounter (Signed)
Ordered

## 2023-04-29 DIAGNOSIS — E039 Hypothyroidism, unspecified: Secondary | ICD-10-CM | POA: Diagnosis not present

## 2023-04-29 DIAGNOSIS — E213 Hyperparathyroidism, unspecified: Secondary | ICD-10-CM | POA: Diagnosis not present

## 2023-04-29 DIAGNOSIS — E559 Vitamin D deficiency, unspecified: Secondary | ICD-10-CM | POA: Diagnosis not present

## 2023-05-10 ENCOUNTER — Telehealth: Payer: 59 | Admitting: Psychiatry
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
## 2023-05-17 ENCOUNTER — Telehealth: Payer: 59 | Admitting: Psychiatry

## 2023-05-17 ENCOUNTER — Encounter: Payer: Self-pay | Admitting: Psychiatry

## 2023-05-17 DIAGNOSIS — F4312 Post-traumatic stress disorder, chronic: Secondary | ICD-10-CM

## 2023-05-17 DIAGNOSIS — R635 Abnormal weight gain: Secondary | ICD-10-CM

## 2023-05-17 DIAGNOSIS — G4700 Insomnia, unspecified: Secondary | ICD-10-CM | POA: Diagnosis not present

## 2023-05-17 DIAGNOSIS — F331 Major depressive disorder, recurrent, moderate: Secondary | ICD-10-CM

## 2023-05-17 MED ORDER — LURASIDONE HCL 20 MG PO TABS: 20.0000 mg | ORAL_TABLET | Freq: Every day | ORAL | 1 refills | Status: DC

## 2023-05-17 MED ORDER — TOPIRAMATE 25 MG PO TABS: 25.0000 mg | ORAL_TABLET | Freq: Every day | ORAL | 3 refills | Status: DC

## 2023-05-17 MED ORDER — PRAZOSIN HCL 2 MG PO CAPS: 2.0000 mg | ORAL_CAPSULE | Freq: Every day | ORAL | 3 refills | Status: DC

## 2023-05-17 MED ORDER — TEMAZEPAM 30 MG PO CAPS: 30.0000 mg | ORAL_CAPSULE | Freq: Every evening | ORAL | 1 refills | Status: DC | PRN

## 2023-05-17 NOTE — Patient Instructions (Signed)
Continue Pristiq 100 mg daily Continue bupropion 150 mg daily  Start latuda 20 mg daily  Decrease Abilify 10 mg daily for one week, then discontinue   Continue topiramate 25 mg daily  Continue prazosin 2 mg at night Continue temazepam 30 mg at night as needed for insomnia.  Next appointment: 10/18 at 8 30

## 2023-05-31 ENCOUNTER — Other Ambulatory Visit: Payer: Self-pay | Admitting: Psychiatry

## 2023-05-31 ENCOUNTER — Telehealth: Payer: Self-pay | Admitting: Psychiatry

## 2023-05-31 DIAGNOSIS — F331 Major depressive disorder, recurrent, moderate: Secondary | ICD-10-CM

## 2023-05-31 DIAGNOSIS — F4312 Post-traumatic stress disorder, chronic: Secondary | ICD-10-CM

## 2023-05-31 MED ORDER — DESVENLAFAXINE SUCCINATE ER 100 MG PO TB24
100.0000 mg | ORAL_TABLET | Freq: Every day | ORAL | 1 refills | Status: DC
Start: 2023-05-31 — End: 2023-07-12

## 2023-05-31 NOTE — Telephone Encounter (Signed)
Ordered

## 2023-05-31 NOTE — Telephone Encounter (Signed)
Patient states pharmacy has tried contacting office for refill. She needs a refill on Pristiq. Please send to [facility] on [address]

## 2023-06-03 DIAGNOSIS — M797 Fibromyalgia: Secondary | ICD-10-CM | POA: Diagnosis not present

## 2023-06-03 DIAGNOSIS — M79671 Pain in right foot: Secondary | ICD-10-CM | POA: Diagnosis not present

## 2023-06-03 DIAGNOSIS — Z79899 Other long term (current) drug therapy: Secondary | ICD-10-CM | POA: Diagnosis not present

## 2023-06-03 DIAGNOSIS — M0609 Rheumatoid arthritis without rheumatoid factor, multiple sites: Secondary | ICD-10-CM | POA: Diagnosis not present

## 2023-06-03 NOTE — Telephone Encounter (Signed)
pt was already notifed . pt states she had no issues picking up medication.

## 2023-06-10 ENCOUNTER — Other Ambulatory Visit: Payer: Self-pay

## 2023-06-10 ENCOUNTER — Ambulatory Visit (INDEPENDENT_AMBULATORY_CARE_PROVIDER_SITE_OTHER): Payer: 59 | Admitting: Nurse Practitioner

## 2023-06-10 ENCOUNTER — Encounter: Payer: Self-pay | Admitting: Nurse Practitioner

## 2023-06-10 VITALS — BP 130/82 | HR 100 | Temp 98.6°F | Resp 16 | Ht 62.0 in | Wt 288.0 lb

## 2023-06-10 DIAGNOSIS — J069 Acute upper respiratory infection, unspecified: Secondary | ICD-10-CM | POA: Diagnosis not present

## 2023-06-10 DIAGNOSIS — R051 Acute cough: Secondary | ICD-10-CM | POA: Diagnosis not present

## 2023-06-10 DIAGNOSIS — J4520 Mild intermittent asthma, uncomplicated: Secondary | ICD-10-CM

## 2023-06-10 MED ORDER — METHYLPREDNISOLONE 4 MG PO TBPK: ORAL_TABLET | ORAL | 0 refills | Status: DC

## 2023-06-10 MED ORDER — HYDROCOD POLI-CHLORPHE POLI ER 10-8 MG/5ML PO SUER
5.0000 mL | Freq: Two times a day (BID) | ORAL | 0 refills | Status: DC | PRN
Start: 2023-06-10 — End: 2024-02-04

## 2023-06-10 NOTE — Progress Notes (Signed)
BP 130/82   Pulse 100   Temp 98.6 F (37 C) (Oral)   Resp 16   Ht 5\' 2"  (1.575 m)   Wt 288 lb (130.6 kg)   SpO2 99%   BMI 52.68 kg/m    Subjective:    Patient ID: Heather Jefferson, female    DOB: 1970-11-26, 52 y.o.   MRN: [id number]  HPI: Heather Jefferson is a 52 y.o. female  Chief Complaint  Patient presents with   Sore Throat   Cough    Congested, fever, chills for 3 days   URI Compliant: symptoms started Friday -Fever: yes on Saturday -Cough: yes -Shortness of breath: yes, has been using albuterol  -Wheezing: yes, has been using albuterol -Chest congestion: yes -Nasal congestion: yes -Runny nose: yes -Post nasal drip: yes -Sore throat: yes -Sinus pressure: not currently -Headache: no -Face pain: no -Ear pain:  no -Ear pressure: no -body aches and fatigue: yes -Relief with OTC cold/cough medications: tessalon perls, no improvement  -covid test pending, discussed antiviral treatment if positive, discussed side effects, last GFR in normal range.   Recommend taking zyrtec, flonase, mucinex, vitamin d, vitamin c, and zinc. Push fluids and get rest.     Relevant past medical, surgical, family and social history reviewed and updated as indicated. Interim medical history since our last visit reviewed. Allergies and medications reviewed and updated.  Review of Systems Ten systems reviewed and is negative except as mentioned in HPI       Objective:    BP 130/82   Pulse 100   Temp 98.6 F (37 C) (Oral)   Resp 16   Ht 5\' 2"  (1.575 m)   Wt 288 lb (130.6 kg)   SpO2 99%   BMI 52.68 kg/m   Wt Readings from Last 3 Encounters:  06/10/23 288 lb (130.6 kg)  12/21/22 291 lb 6.4 oz (132.2 kg)  12/13/22 296 lb (134.3 kg)    Physical Exam  Constitutional: Patient appears well-developed and well-nourished. Obese  No distress.  HEENT: head atraumatic, normocephalic, pupils equal and reactive to light, ears Tms clear, neck supple, throat within normal limits,  no lymphadenopathy  Cardiovascular: Normal rate, regular rhythm and normal heart sounds.  No murmur heard. No BLE edema. Pulmonary/Chest: Effort normal and breath sounds normal. No respiratory distress. Abdominal: Soft.  There is no tenderness. Psychiatric: Patient has a normal mood and affect. behavior is normal. Judgment and thought content normal.      Assessment & Plan:   Problem List Items Addressed This Visit   None Visit Diagnoses     Viral upper respiratory tract infection    -  Primary   covid test pending,  push fluids get rest,  start claritin, flonase, mucinex. tussinex, medrol pac, vitamin c, vimtain d and zinc.can continue albuterol inhaler   Relevant Medications   methylPREDNISolone (MEDROL DOSEPAK) 4 MG TBPK tablet   chlorpheniramine-HYDROcodone (TUSSIONEX) 10-8 MG/5ML   Other Relevant Orders   Novel Coronavirus, NAA ([facility])   Mild intermittent reactive airway disease without complication       continue abluterol inhaler start trelegy daily while sick, remember to rinse out mouth   Relevant Medications   methylPREDNISolone (MEDROL DOSEPAK) 4 MG TBPK tablet        Follow up plan: Return if symptoms worsen or fail to improve.

## 2023-06-10 NOTE — Patient Instructions (Signed)
Start trelegy sample, 1 puff daily, rinse mouth after use Continue albuterol inhaler as needed Claritin/flonase/mucinex over the counter Medrol dose pac Tussinex for cough Can also take vitamin d, vitamin c and zinc Push fluids and get rest

## 2023-06-11 LAB — NOVEL CORONAVIRUS, NAA: SARS-CoV-2, NAA: NOT DETECTED

## 2023-06-11 LAB — SPECIMEN STATUS REPORT

## 2023-06-19 ENCOUNTER — Other Ambulatory Visit: Payer: Self-pay | Admitting: Psychiatry

## 2023-06-19 DIAGNOSIS — F331 Major depressive disorder, recurrent, moderate: Secondary | ICD-10-CM

## 2023-07-01 DIAGNOSIS — E039 Hypothyroidism, unspecified: Secondary | ICD-10-CM | POA: Diagnosis not present
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
## 2023-07-12 ENCOUNTER — Encounter: Payer: Self-pay | Admitting: Psychiatry

## 2023-07-12 ENCOUNTER — Telehealth (INDEPENDENT_AMBULATORY_CARE_PROVIDER_SITE_OTHER): Payer: 59 | Admitting: Psychiatry

## 2023-07-12 DIAGNOSIS — F4312 Post-traumatic stress disorder, chronic: Secondary | ICD-10-CM

## 2023-07-12 DIAGNOSIS — R635 Abnormal weight gain: Secondary | ICD-10-CM | POA: Diagnosis not present

## 2023-07-12 DIAGNOSIS — F331 Major depressive disorder, recurrent, moderate: Secondary | ICD-10-CM

## 2023-07-12 DIAGNOSIS — G4700 Insomnia, unspecified: Secondary | ICD-10-CM | POA: Diagnosis not present

## 2023-07-12 MED ORDER — DESVENLAFAXINE SUCCINATE ER 100 MG PO TB24
100.0000 mg | ORAL_TABLET | Freq: Every day | ORAL | 3 refills | Status: DC
Start: 2023-07-30 — End: 2023-12-03

## 2023-07-12 MED ORDER — TEMAZEPAM 30 MG PO CAPS: 30.0000 mg | ORAL_CAPSULE | Freq: Every evening | ORAL | 2 refills | Status: DC | PRN

## 2023-07-12 MED ORDER — LURASIDONE HCL 20 MG PO TABS: 20.0000 mg | ORAL_TABLET | Freq: Every day | ORAL | 1 refills | Status: DC

## 2023-07-12 NOTE — Patient Instructions (Signed)
Continue Pristiq 100 mg daily Continue bupropion 150 mg daily  Continue latuda 20 mg daily  Continue topiramate 25 mg daily  Continue prazosin 2 mg at night Continue temazepam 30 mg at night as needed for insomnia.  Next appointment: 12/5 at 10 30

## 2023-07-19 ENCOUNTER — Other Ambulatory Visit: Payer: Self-pay | Admitting: Child and Adolescent Psychiatry

## 2023-07-19 DIAGNOSIS — F331 Major depressive disorder, recurrent, moderate: Secondary | ICD-10-CM

## 2023-07-19 NOTE — Telephone Encounter (Signed)
Dr. Hisada's pt 

## 2023-08-11 ENCOUNTER — Encounter: Payer: Self-pay | Admitting: Internal Medicine

## 2023-08-16 ENCOUNTER — Other Ambulatory Visit: Payer: Self-pay | Admitting: Family Medicine

## 2023-08-16 DIAGNOSIS — M797 Fibromyalgia: Secondary | ICD-10-CM

## 2023-08-22 ENCOUNTER — Encounter: Payer: Self-pay | Admitting: Internal Medicine
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
## 2023-08-26 DIAGNOSIS — J4530 Mild persistent asthma, uncomplicated: Secondary | ICD-10-CM | POA: Diagnosis not present

## 2023-08-29 ENCOUNTER — Telehealth: Payer: 59 | Admitting: Psychiatry

## 2023-08-29 ENCOUNTER — Encounter: Payer: Self-pay | Admitting: Psychiatry

## 2023-08-29 DIAGNOSIS — R635 Abnormal weight gain: Secondary | ICD-10-CM

## 2023-08-29 DIAGNOSIS — F331 Major depressive disorder, recurrent, moderate: Secondary | ICD-10-CM

## 2023-08-29 DIAGNOSIS — G4700 Insomnia, unspecified: Secondary | ICD-10-CM | POA: Diagnosis not present

## 2023-08-29 DIAGNOSIS — F4312 Post-traumatic stress disorder, chronic: Secondary | ICD-10-CM | POA: Diagnosis not present

## 2023-08-29 MED ORDER — TOPIRAMATE 25 MG PO TABS: 25.0000 mg | ORAL_TABLET | Freq: Every day | ORAL | 3 refills | Status: DC

## 2023-08-29 MED ORDER — PRAZOSIN HCL 2 MG PO CAPS: 2.0000 mg | ORAL_CAPSULE | Freq: Every day | ORAL | 3 refills | Status: DC

## 2023-08-29 MED ORDER — LURASIDONE HCL 40 MG PO TABS: 40.0000 mg | ORAL_TABLET | Freq: Every day | ORAL | 1 refills | Status: DC

## 2023-08-29 MED ORDER — BUPROPION HCL ER (XL) 150 MG PO TB24: 150.0000 mg | ORAL_TABLET | Freq: Every morning | ORAL | 3 refills | Status: DC

## 2023-09-10 ENCOUNTER — Other Ambulatory Visit: Payer: Self-pay | Admitting: Psychiatry

## 2023-09-27 ENCOUNTER — Encounter: Payer: Medicare Other | Admitting: Family Medicine

## 2023-09-30 ENCOUNTER — Telehealth (INDEPENDENT_AMBULATORY_CARE_PROVIDER_SITE_OTHER): Payer: 59 | Admitting: Psychiatry

## 2023-09-30 ENCOUNTER — Encounter: Payer: Self-pay | Admitting: Psychiatry

## 2023-09-30 DIAGNOSIS — G4700 Insomnia, unspecified: Secondary | ICD-10-CM

## 2023-09-30 DIAGNOSIS — F331 Major depressive disorder, recurrent, moderate: Secondary | ICD-10-CM | POA: Diagnosis not present

## 2023-09-30 DIAGNOSIS — R635 Abnormal weight gain: Secondary | ICD-10-CM

## 2023-09-30 DIAGNOSIS — F4312 Post-traumatic stress disorder, chronic: Secondary | ICD-10-CM | POA: Diagnosis not present

## 2023-09-30 NOTE — Patient Instructions (Signed)
 Continue Pristiq 100 mg daily Continue bupropion 150 mg daily  Continue latuda 40 mg daily   Continue topiramate 25 mg daily  Continue prazosin 2 mg at night Continue temazepam 30 mg at night as needed for insomnia.  Next appointment: 2/17 at 8 am
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
## 2023-10-03 DIAGNOSIS — Z79899 Other long term (current) drug therapy: Secondary | ICD-10-CM | POA: Diagnosis not present

## 2023-10-03 DIAGNOSIS — M0609 Rheumatoid arthritis without rheumatoid factor, multiple sites: Secondary | ICD-10-CM | POA: Diagnosis not present

## 2023-10-24 ENCOUNTER — Ambulatory Visit: Payer: 59 | Admitting: Psychiatry

## 2023-10-29 ENCOUNTER — Ambulatory Visit (INDEPENDENT_AMBULATORY_CARE_PROVIDER_SITE_OTHER): Payer: 59 | Admitting: Professional Counselor

## 2023-10-29 DIAGNOSIS — F4312 Post-traumatic stress disorder, chronic: Secondary | ICD-10-CM

## 2023-10-29 DIAGNOSIS — F4321 Adjustment disorder with depressed mood: Secondary | ICD-10-CM

## 2023-10-29 DIAGNOSIS — F331 Major depressive disorder, recurrent, moderate: Secondary | ICD-10-CM | POA: Diagnosis not present
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
## 2023-11-11 ENCOUNTER — Encounter: Payer: Self-pay | Admitting: Psychiatry

## 2023-11-11 ENCOUNTER — Ambulatory Visit (INDEPENDENT_AMBULATORY_CARE_PROVIDER_SITE_OTHER): Payer: 59 | Admitting: Psychiatry

## 2023-11-11 VITALS — BP 152/79 | HR 69 | Temp 97.5°F | Ht 62.0 in | Wt 284.0 lb

## 2023-11-11 DIAGNOSIS — R632 Polyphagia: Secondary | ICD-10-CM

## 2023-11-11 DIAGNOSIS — Z79899 Other long term (current) drug therapy: Secondary | ICD-10-CM | POA: Diagnosis not present

## 2023-11-11 DIAGNOSIS — F4312 Post-traumatic stress disorder, chronic: Secondary | ICD-10-CM

## 2023-11-11 DIAGNOSIS — R635 Abnormal weight gain: Secondary | ICD-10-CM

## 2023-11-11 DIAGNOSIS — F331 Major depressive disorder, recurrent, moderate: Secondary | ICD-10-CM

## 2023-11-11 MED ORDER — LURASIDONE HCL 40 MG PO TABS: 40.0000 mg | ORAL_TABLET | Freq: Every day | ORAL | 1 refills | Status: DC

## 2023-11-11 MED ORDER — TEMAZEPAM 30 MG PO CAPS: 30.0000 mg | ORAL_CAPSULE | Freq: Every evening | ORAL | 1 refills | Status: DC | PRN

## 2023-11-11 NOTE — Patient Instructions (Signed)
Continue Pristiq 100 mg daily Continue bupropion 150 mg daily  Continue latuda 40 mg daily   Continue topiramate 25 mg daily  Continue prazosin 2 mg at night Continue temazepam 30 mg at night as needed for insomnia Obtain urine screening at [facility] Next appointment: 4/15 at 8 AM

## 2023-11-12 ENCOUNTER — Ambulatory Visit (INDEPENDENT_AMBULATORY_CARE_PROVIDER_SITE_OTHER): Payer: 59 | Admitting: Professional Counselor

## 2023-11-12 ENCOUNTER — Encounter: Payer: Self-pay | Admitting: Psychiatry

## 2023-11-12 DIAGNOSIS — F331 Major depressive disorder, recurrent, moderate: Secondary | ICD-10-CM

## 2023-11-12 DIAGNOSIS — F4321 Adjustment disorder with depressed mood: Secondary | ICD-10-CM

## 2023-11-12 LAB — URINE DRUG PANEL 7
Amphetamines, Urine: NEGATIVE ng/mL
Barbiturate Quant, Ur: NEGATIVE ng/mL
Benzodiazepine Quant, Ur: NEGATIVE ng/mL
Cannabinoid Quant, Ur: NEGATIVE ng/mL
Cocaine (Metab.): NEGATIVE ng/mL
Opiate Quant, Ur: NEGATIVE ng/mL
PCP Quant, Ur: NEGATIVE ng/mL

## 2023-11-12 NOTE — Progress Notes (Signed)
   THERAPIST PROGRESS NOTE  Session Time: 8:00 AM -8:50 AM   Participation Level: Active  Behavioral Response: Casual, Alert, Depressed  Type of Therapy: Individual Therapy  Treatment Goals addressed: Active OP Depression  LTG: "I just want to be happy again. I'm just tired of being sad and unfulfilled. Tired of beating myself up over the past."    Start:  11/12/23    Expected End:  11/11/24     STG: "Dealing with my grief." To move through the stages of grief AEB processing thoughts and feelings in a balanced/healthy way and reporting improvement in mood and daily functioning over the next 12 weeks.   STG: "I guess it would be better if I didn't feel so anxious when they (children) were away from me." To reduce symptoms of anxiety AEB utilizing coping mechanisms and restructuring maladaptive patterns of thinking over the next 12 weeks.    ProgressTowards Goals: Initial  Interventions: CBT and Motivational Interviewing  Summary: Heather Jefferson is a 53 y.o. female who presents with a history of anxiety, depression, bipolar disorder, PTSD, and grief. Heather Jefferson appeared depressed but oriented x5. She stated things have been hard on her. She engaged in completing her treatment plan to focus on her grief and anxiety symptoms. She noted previous therapy has been more talk therapy and she doesn't remember any coping skills to practice. She was receptive to skills discussed in session and engaged in 5-4-3-2-1 skill. Heather Jefferson identified it helped her get out of her thoughts and back into the moment. She reported her son asked her to clean out her closet/husband's things and she doesn't feel ready for that. She was tearful as she discussed her grief and how cleaning out the closet makes it seem more final that he is gone. Heather Jefferson engaged in discussion about stages of grief and identified ways she has been in different stages. She noted the poem was "beautiful." She stated she will try to clean out her  husband's closet. She plans to let her son handle the clothing and she will handle the important paperwork.    Therapist Response: Conducted session with Heather Jefferson. Began session with check-in/update since previous session. Developed tx plan with input from Heather Jefferson on current strengths, needs, and progress towards goals. Explained coping mechanisms, breathing exercises, and engaged Heather Jefferson in 5-4-3-2-1 Heather Jefferson. Allowed space for Heather Jefferson to discuss her grief. Explained stages of grief and shared poem about grief. Encouraged Heather Jefferson to practice coping mechanisms between now and next session.   Suicidal/Homicidal: No  Plan: Return again in 2 weeks.  Diagnosis: MDD (major depressive disorder), recurrent episode, moderate (HCC)  Grief  Collaboration of Care: Medication Management AEB chart review  Patient/Guardian was advised Release of Information must be obtained prior to any record release in order to collaborate their care with an outside provider. Patient/Guardian was advised if they have not already done so to contact the registration department to sign all necessary forms in order for [place] to release information regarding their care.   Consent: Patient/Guardian gives verbal consent for treatment and assignment of benefits for services provided during this visit. Patient/Guardian expressed understanding and agreed to proceed.   Heather Jefferson, Heather Jefferson 11/12/2023

## 2023-11-13 DIAGNOSIS — G4733 Obstructive sleep apnea (adult) (pediatric): Secondary | ICD-10-CM | POA: Diagnosis not present

## 2023-11-15 ENCOUNTER — Other Ambulatory Visit: Payer: Self-pay | Admitting: Family Medicine

## 2023-11-15 DIAGNOSIS — M797 Fibromyalgia: Secondary | ICD-10-CM

## 2023-11-27 ENCOUNTER — Ambulatory Visit (INDEPENDENT_AMBULATORY_CARE_PROVIDER_SITE_OTHER): Payer: 59 | Admitting: Professional Counselor

## 2023-11-27 DIAGNOSIS — F411 Generalized anxiety disorder: Secondary | ICD-10-CM | POA: Diagnosis not present

## 2023-11-27 NOTE — Progress Notes (Signed)
  THERAPIST PROGRESS NOTE  Session Time: 8:00 AM - 8:50 AM   Participation Level: Active  Behavioral Response: Casual, Alert, Anxious and Dysphoric  Type of Therapy: Individual Therapy  Treatment Goals addressed: Active OP Depression  LTG: "I just want to be happy again. I'm just tired of being sad and unfulfilled. Tired of beating myself up over the past."                Start:  11/12/23    Expected End:  11/11/24      STG: "Dealing with my grief." To move through the stages of grief AEB processing thoughts and feelings in a balanced/healthy way and reporting improvement in mood and daily functioning over the next 12 weeks.    STG: "I guess it would be better if I didn't feel so anxious when they (children) were away from me." To reduce symptoms of anxiety AEB utilizing coping mechanisms and restructuring maladaptive patterns of thinking over the next 12 weeks.    ProgressTowards Goals: Progressing  Interventions: CBT  Summary: Heather Jefferson is a 53 y.o. female who presents with a history of anxiety, depression, bipolar disorder, and PTSD. She appeared somber but oriented x5. She stated she was able to clear out her husband's closet and drawers. The kids helped her and they actually found photos and other items that brought up positive memories/emotions. Heather Jefferson reported they are having his memorial this weekend. She is anxious about it because her family planned everything without her. She noted a lack of motivation still, which improves when her kids are home. She was receptive to information presented in session. She engaged in creating an exposure hierarchy around driving. She also engaged in creating her donut and identifying things within and outside of her control. She will try to begin working on her exposure hierarchy.  Therapist Response: Conducted session with Heather Jefferson. Began session with check-in/update since previous session. Utilized empathetic and reflective listening.  Provided psychoeducation on cognitive model and cycle of avoidance. Explained exposure hierarchy and assisted with creating list for Heather Jefferson around driving. Engaged Heather Jefferson in additional writing exercise - donut/circles of control. Assisted with identifying things within and outside of Heather Jefferson control. Encouraged Heather Jefferson to keep this in mind and begin exposure hierarchy if she can. Scheduled additional appointment and concluded session.   Suicidal/Homicidal: No  Plan: Return again in 2 weeks.  Diagnosis: Anxiety, generalized  Collaboration of Care: Medication Management AEB chart review  Patient/Guardian was advised Release of Information must be obtained prior to any record release in order to collaborate their care with an outside provider. Patient/Guardian was advised if they have not already done so to contact the registration department to sign all necessary forms in order for [place] to release information regarding their care.   Consent: Patient/Guardian gives verbal consent for treatment and assignment of benefits for services provided during this visit. Patient/Guardian expressed understanding and agreed to proceed.   Heather Jefferson, [facility] 11/27/2023

## 2023-12-03 ENCOUNTER — Telehealth: Payer: Self-pay

## 2023-12-03 DIAGNOSIS — F4312 Post-traumatic stress disorder, chronic: Secondary | ICD-10-CM

## 2023-12-03 DIAGNOSIS — F331 Major depressive disorder, recurrent, moderate: Secondary | ICD-10-CM

## 2023-12-03 MED ORDER — DESVENLAFAXINE SUCCINATE ER 100 MG PO TB24: 100.0000 mg | ORAL_TABLET | Freq: Every day | ORAL | 0 refills | Status: DC

## 2023-12-03 NOTE — Telephone Encounter (Signed)
 left message letting pt know rx has been sent to the pharmacy

## 2023-12-03 NOTE — Telephone Encounter (Signed)
 pt left a message that she needs a refill on the pristiq. pt was last seen on 2-17 next appt 4-15

## 2023-12-05 ENCOUNTER — Encounter: Payer: Medicare Other | Admitting: Family Medicine

## 2023-12-05 ENCOUNTER — Encounter: Payer: 59 | Admitting: Family Medicine

## 2023-12-11 ENCOUNTER — Ambulatory Visit (INDEPENDENT_AMBULATORY_CARE_PROVIDER_SITE_OTHER): Payer: 59 | Admitting: Professional Counselor

## 2023-12-11 DIAGNOSIS — F331 Major depressive disorder, recurrent, moderate: Secondary | ICD-10-CM | POA: Diagnosis not present
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
## 2023-12-16 LAB — HM DIABETES EYE EXAM

## 2023-12-25 ENCOUNTER — Ambulatory Visit (INDEPENDENT_AMBULATORY_CARE_PROVIDER_SITE_OTHER): Payer: 59 | Admitting: Professional Counselor

## 2023-12-25 DIAGNOSIS — F4312 Post-traumatic stress disorder, chronic: Secondary | ICD-10-CM

## 2023-12-25 NOTE — Progress Notes (Unsigned)
   THERAPIST PROGRESS NOTE  Session Time: 11:03 AM - 11:56 AM  Participation Level: Active  Behavioral Response: CasualAlertDysphoric  Type of Therapy: Individual Therapy  Treatment Goals addressed: ***  ProgressTowards Goals: Progressing  Interventions: Motivational Interviewing and Supportive  Summary: Heather Jefferson is a 53 y.o. female who presents with ***. Discuss daughter, adoption, upcoming holidays, maybe reach out to [facility]  Suicidal/Homicidal: No  Therapist Response: ***  Plan: Return again in *** weeks.  Diagnosis: No diagnosis found.  Collaboration of Care: {BH OP Collaboration of Care:[id number]}  Patient/Guardian was advised Release of Information must be obtained prior to any record release in order to collaborate their care with an outside provider. Patient/Guardian was advised if they have not already done so to contact the registration department to sign all necessary forms in order for [place] to release information regarding their care.   Consent: Patient/Guardian gives verbal consent for treatment and assignment of benefits for services provided during this visit. Patient/Guardian expressed understanding and agreed to proceed.   Edmonia Lynch, [facility] 12/25/2023

## 2024-01-01 ENCOUNTER — Encounter: Payer: Self-pay | Admitting: Family Medicine

## 2024-01-01 ENCOUNTER — Other Ambulatory Visit: Payer: Self-pay | Admitting: Psychiatry

## 2024-01-01 ENCOUNTER — Other Ambulatory Visit (HOSPITAL_COMMUNITY)
Admission: RE | Admit: 2024-01-01 | Discharge: 2024-01-01 | Disposition: A | Discharge status: Home / Self Care | Source: Ambulatory Visit | Attending: Family Medicine | Admitting: Family Medicine

## 2024-01-01 ENCOUNTER — Ambulatory Visit (INDEPENDENT_AMBULATORY_CARE_PROVIDER_SITE_OTHER): Admitting: Family Medicine

## 2024-01-01 VITALS — BP 126/84 | HR 73 | Resp 16 | Ht 62.0 in | Wt 280.7 lb

## 2024-01-01 DIAGNOSIS — Z1151 Encounter for screening for human papillomavirus (HPV): Secondary | ICD-10-CM | POA: Diagnosis not present

## 2024-01-01 DIAGNOSIS — Z0000 Encounter for general adult medical examination without abnormal findings: Secondary | ICD-10-CM | POA: Diagnosis not present

## 2024-01-01 DIAGNOSIS — Z23 Encounter for immunization: Secondary | ICD-10-CM | POA: Diagnosis not present

## 2024-01-01 DIAGNOSIS — Z124 Encounter for screening for malignant neoplasm of cervix: Secondary | ICD-10-CM | POA: Insufficient documentation

## 2024-01-01 DIAGNOSIS — Z1231 Encounter for screening mammogram for malignant neoplasm of breast: Secondary | ICD-10-CM | POA: Diagnosis not present

## 2024-01-01 DIAGNOSIS — Z01419 Encounter for gynecological examination (general) (routine) without abnormal findings: Secondary | ICD-10-CM | POA: Diagnosis present
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
## 2024-01-02 ENCOUNTER — Other Ambulatory Visit: Payer: Self-pay | Admitting: Psychiatry

## 2024-01-02 ENCOUNTER — Telehealth: Payer: Self-pay

## 2024-01-02 DIAGNOSIS — F331 Major depressive disorder, recurrent, moderate: Secondary | ICD-10-CM

## 2024-01-02 DIAGNOSIS — F4312 Post-traumatic stress disorder, chronic: Secondary | ICD-10-CM

## 2024-01-02 MED ORDER — DESVENLAFAXINE SUCCINATE ER 100 MG PO TB24: 100.0000 mg | ORAL_TABLET | Freq: Every day | ORAL | 0 refills | Status: DC

## 2024-01-02 NOTE — Telephone Encounter (Signed)
 Ordered

## 2024-01-02 NOTE — Telephone Encounter (Signed)
 pt called states she is [place] be [place] and needs refill on the desvenlafaxine 100mg . please send to the [facility] [address].  Pt last seen on  2-17 next appt 4-15

## 2024-01-02 NOTE — Telephone Encounter (Signed)
 Pt.notified

## 2024-01-03 LAB — CYTOLOGY - PAP
Chlamydia: NEGATIVE
Comment: NEGATIVE
Comment: NEGATIVE
Comment: NORMAL
Diagnosis: NEGATIVE
High risk HPV: NEGATIVE
Neisseria Gonorrhea: NEGATIVE

## 2024-01-07 ENCOUNTER — Encounter: Payer: Self-pay | Admitting: Psychiatry

## 2024-01-07 ENCOUNTER — Other Ambulatory Visit: Payer: Self-pay

## 2024-01-07 ENCOUNTER — Ambulatory Visit (INDEPENDENT_AMBULATORY_CARE_PROVIDER_SITE_OTHER): Payer: 59 | Admitting: Psychiatry

## 2024-01-07 VITALS — BP 144/89 | HR 66 | Temp 97.3°F | Ht 62.0 in | Wt 282.2 lb

## 2024-01-07 DIAGNOSIS — F4312 Post-traumatic stress disorder, chronic: Secondary | ICD-10-CM

## 2024-01-07 DIAGNOSIS — F331 Major depressive disorder, recurrent, moderate: Secondary | ICD-10-CM | POA: Diagnosis not present

## 2024-01-07 DIAGNOSIS — R635 Abnormal weight gain: Secondary | ICD-10-CM

## 2024-01-07 DIAGNOSIS — R632 Polyphagia: Secondary | ICD-10-CM | POA: Diagnosis not present

## 2024-01-07 DIAGNOSIS — G4700 Insomnia, unspecified: Secondary | ICD-10-CM

## 2024-01-07 MED ORDER — TEMAZEPAM 30 MG PO CAPS: 30.0000 mg | ORAL_CAPSULE | Freq: Every evening | ORAL | 2 refills | Status: DC | PRN

## 2024-01-07 MED ORDER — LURASIDONE HCL 20 MG PO TABS: 20.0000 mg | ORAL_TABLET | Freq: Every day | ORAL | 0 refills | Status: DC

## 2024-01-07 MED ORDER — BUPROPION HCL ER (XL) 150 MG PO TB24: 150.0000 mg | ORAL_TABLET | Freq: Every morning | ORAL | 1 refills | Status: DC

## 2024-01-07 MED ORDER — TOPIRAMATE 25 MG PO TABS
25.0000 mg | ORAL_TABLET | Freq: Every day | ORAL | 0 refills | Status: DC
Start: 2024-01-25 — End: 2024-06-10

## 2024-01-07 MED ORDER — PRAZOSIN HCL 2 MG PO CAPS: 2.0000 mg | ORAL_CAPSULE | Freq: Every day | ORAL | 0 refills | Status: DC

## 2024-01-07 MED ORDER — DESVENLAFAXINE SUCCINATE ER 100 MG PO TB24: 100.0000 mg | ORAL_TABLET | Freq: Every day | ORAL | 1 refills | Status: DC

## 2024-01-07 NOTE — Patient Instructions (Signed)
 Continue Pristiq 100 mg daily Continue bupropion 150 mg daily  Continue latuda 20 mg daily   Continue topiramate 25 mg daily  Continue prazosin 2 mg at night Continue temazepam 30 mg at night as needed for insomnia.  Next appointment: 5/27 at 10:30

## 2024-01-08 ENCOUNTER — Other Ambulatory Visit: Payer: Self-pay | Admitting: Psychiatry

## 2024-01-10 ENCOUNTER — Ambulatory Visit (INDEPENDENT_AMBULATORY_CARE_PROVIDER_SITE_OTHER)

## 2024-01-10 DIAGNOSIS — Z0000 Encounter for general adult medical examination without abnormal findings: Secondary | ICD-10-CM

## 2024-01-10 NOTE — Patient Instructions (Addendum)
 [name] , Thank you for taking time to come for your Medicare Wellness Visit. I appreciate your ongoing commitment to your health goals. Please review the following plan we discussed and let me know if I can assist you in the future.   Referrals/Orders/Follow-Ups/Clinician Recommendations: none   This is a list of the screening recommended for you and due dates:  Health Maintenance  Topic Date Due   Mammogram  10/09/2019   COVID-19 Vaccine (4 - 2024-25 season) 01/17/2024*   Flu Shot  04/24/2024   Medicare Annual Wellness Visit  01/09/2025   Colon Cancer Screening  01/28/2025   DTaP/Tdap/Td vaccine (3 - Td or Tdap) 09/30/2028   Pap with HPV screening  12/31/2028   Pneumococcal Vaccination  Completed   Hepatitis C Screening  Completed   HIV Screening  Completed   Zoster (Shingles) Vaccine  Completed   HPV Vaccine  Aged Out   Meningitis B Vaccine  Aged Out  *Topic was postponed. The date shown is not the original due date.    Advanced directives: (ACP Link)Information on Advanced Care Planning can be found at [place]  Secretary of [facility] Advance Health Care Directives Advance Health Care Directives. [email or web address]   Next Medicare Annual Wellness Visit scheduled for next year: Yes  01/21/25 @ 8:10 AM BY PHONE

## 2024-01-10 NOTE — Progress Notes (Signed)
 Subjective:   Heather Jefferson is a 53 y.o. who presents for a Medicare Wellness preventive visit.  Visit Complete: Virtual I connected with  Heather Jefferson on 01/10/24 by a audio enabled telemedicine application and verified that I am speaking with the correct person using two identifiers.  Patient Location: Home  Provider Location: Home Office  I discussed the limitations of evaluation and management by telemedicine. The patient expressed understanding and agreed to proceed.  Vital Signs: Because this visit was a virtual/telehealth visit, some criteria may be missing or patient reported. Any vitals not documented were not able to be obtained and vitals that have been documented are patient reported.  VideoDeclined- This patient declined Librarian, academic. Therefore the visit was completed with audio only.  Persons Participating in Visit: Patient.  AWV Questionnaire: No: Patient Medicare AWV questionnaire was not completed prior to this visit.  Cardiac Risk Factors include: advanced age (>10men, >66 women);dyslipidemia;hypertension;sedentary lifestyle;obesity (BMI >30kg/m2)     Objective:    There were no vitals filed for this visit. There is no height or weight on file to calculate BMI.     01/10/2024    8:16 AM 12/13/2022    2:16 PM 12/07/2021    8:25 AM 12/06/2020    8:32 AM 01/29/2020    8:16 AM 12/03/2019    8:15 AM 11/28/2018    8:24 AM  Advanced Directives  Does Patient Have a Medical Advance Directive? No No No No No No No  Would patient like information on creating a medical advance directive? No - Patient declined  Yes (MAU/Ambulatory/Procedural Areas - Information given) Yes (MAU/Ambulatory/Procedural Areas - Information given) No - Patient declined Yes (MAU/Ambulatory/Procedural Areas - Information given) No - Patient declined    Current Medications (verified) Outpatient Encounter Medications as of 01/10/2024  Medication Sig    amLODipine (NORVASC) 5 MG tablet Take 5 mg by mouth daily.   [START ON 01/15/2024] buPROPion  (WELLBUTRIN  XL) 150 MG 24 hr tablet Take 1 tablet (150 mg total) by mouth every morning.   [START ON 02/01/2024] desvenlafaxine  (PRISTIQ ) 100 MG 24 hr tablet Take 1 tablet (100 mg total) by mouth daily.   fluticasone -salmeterol (ADVAIR HFA) 115-21 MCG/ACT inhaler Inhale into the lungs.   hydroxychloroquine (PLAQUENIL) 200 MG tablet Take 1 tablet by mouth 2 (two) times daily.   levothyroxine  (SYNTHROID ) 50 MCG tablet Take 50 mcg by mouth every morning.   lurasidone  (LATUDA ) 20 MG TABS tablet Take 1 tablet (20 mg total) by mouth daily.   MIRENA, 52 MG, 20 MCG/24HR IUD 1 each by Intrauterine route once.   pantoprazole (PROTONIX) 40 MG tablet Take 40 mg by mouth 2 (two) times daily.   potassium chloride  (KLOR-CON ) 8 MEQ tablet Take 8 mEq by mouth daily.   [START ON 01/25/2024] prazosin  (MINIPRESS ) 2 MG capsule Take 1 capsule (2 mg total) by mouth at bedtime.   prednisoLONE acetate (PRED FORTE) 1 % ophthalmic suspension Place 1 drop into the right eye daily.   pregabalin  (LYRICA ) 100 MG capsule Take 1 capsule by mouth at bedtime   SUMAtriptan  (IMITREX ) 20 MG/ACT nasal spray Place 1 spray (20 mg total) into the nose every 2 (two) hours as needed for migraine.   [START ON 01/13/2024] temazepam  (RESTORIL ) 30 MG capsule Take 1 capsule (30 mg total) by mouth at bedtime as needed for sleep.   [START ON 01/25/2024] topiramate  (TOPAMAX ) 25 MG tablet Take 1 tablet (25 mg total) by mouth daily.   Vitamin  D, Ergocalciferol , (DRISDOL ) 1.25 MG (50000 UNIT) CAPS capsule Take by mouth.   albuterol (VENTOLIN HFA) 108 (90 Base) MCG/ACT inhaler Inhale into the lungs.   calcitRIOL (ROCALTROL) 0.5 MCG capsule Take 0.5 mcg by mouth daily. (Patient not taking: Reported on 01/10/2024)   chlorpheniramine-HYDROcodone (TUSSIONEX) 10-8 MG/5ML Take 5 mLs by mouth every 12 (twelve) hours as needed for cough. (Patient not taking: Reported on  01/10/2024)   liothyronine (CYTOMEL) 5 MCG tablet Take 5 mcg by mouth every morning. (Patient not taking: Reported on 01/10/2024)   methylPREDNISolone  (MEDROL  DOSEPAK) 4 MG TBPK tablet Take as directed (Patient not taking: Reported on 01/10/2024)   [DISCONTINUED] metFORMIN  (GLUCOPHAGE -XR) 750 MG 24 hr tablet Take 1 tablet (750 mg total) by mouth daily with breakfast. (Patient not taking: Reported on 01/10/2024)   No facility-administered encounter medications on file as of 01/10/2024.    Allergies (verified) Amoxicillin-pot clavulanate, Losartan, and Metformin  and related   History: Past Medical History:  Diagnosis Date   ADHD (attention deficit hyperactivity disorder)    Allergy    Anemia    Anxiety    Depression    Diabetes mellitus, type II (HCC)    Fibromyalgia    Generalized anxiety disorder    Headache    HIV infection (HCC)    Hypertension    Hypothyroidism    Major depressive disorder, recurrent episode, moderate (HCC)    Migraine with acute onset aura    Persistent disorder of initiating or maintaining sleep    PTSD (post-traumatic stress disorder)    Restless leg    Seizure disorder (HCC)    Thyroid  disease    Vitamin D  deficiency    Past Surgical History:  Procedure Laterality Date   COLONOSCOPY WITH PROPOFOL  N/A 01/29/2020   Procedure: COLONOSCOPY WITH PROPOFOL ;  Surgeon: Luke Salaam, MD;  Location: [facility] ENDOSCOPY;  Service: Gastroenterology;  Laterality: N/A;   DMEK Right 12/20/2015   right eye surgery for Fuchs distrophy   TUBAL LIGATION     Family History  Problem Relation Age of Onset   Hypertension Mother    Heart attack Mother    CAD Mother    Diabetes Father    Hypertension Father    Hypertension Sister    Anxiety disorder Brother    Depression Brother    Social History   Socioeconomic History   Marital status: Widowed    Spouse name: roberto   Number of children: 2   Years of education: Not on file   Highest education level: 10th grade   Occupational History   Occupation: disabled  Tobacco Use   Smoking status: Never   Smokeless tobacco: Never  Vaping Use   Vaping status: Never Used  Substance and Sexual Activity   Alcohol use: Not Currently   Drug use: No   Sexual activity: Not Currently    Partners: Male    Birth control/protection: Other-see comments, I.U.D.    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   Social Drivers of Health   Financial Resource Strain: Low Risk  (01/10/2024)   Overall Financial Resource Strain (CARDIA)    Difficulty of Paying Living Expenses: Not very hard  Food Insecurity: No Food Insecurity (01/10/2024)   Hunger Vital Sign    Worried About Running Out of Food in the Last Year: Never true  Ran Out of Food in the Last Year: Never true  Transportation Needs: No Transportation Needs (01/10/2024)   PRAPARE - Administrator, Civil Service (Medical): No    Lack of Transportation (Non-Medical): No  Physical Activity: Inactive (01/10/2024)   Exercise Vital Sign    Days of Exercise per Week: 0 days    Minutes of Exercise per Session: 0 min  Stress: No Stress Concern Present (01/10/2024)   [employer] of Occupational Health - Occupational Stress Questionnaire    Feeling of Stress : Only a little  Recent Concern: Stress - Stress Concern Present (10/29/2023)   [employer] of Occupational Health - Occupational Stress Questionnaire    Feeling of Stress : Very much  Social Connections: Socially Isolated (01/10/2024)   Social Connection and Isolation Panel [NHANES]    Frequency of Communication with Friends and Family: Twice a week    Frequency of Social Gatherings with Friends and Family: Never    Attends Religious Services: More than 4 times per year    Active Member of [employer] or Organizations: No    Attends Tax inspector Meetings: Never    Marital Status: Widowed    Tobacco Counseling Counseling given: Not Answered    Clinical Intake:  Pre-visit preparation completed: Yes  Pain : No/denies pain     BMI - recorded: 51.2 Nutritional Status: BMI > 30  Obese Nutritional Risks: None Diabetes: No  Lab Results  Component Value Date   HGBA1C 5.5 05/23/2021   HGBA1C 5.8 (H) 05/10/2020   HGBA1C 5.2 05/04/2019     How often do you need to have someone help you when you read instructions, pamphlets, or other written materials from your doctor or pharmacy?: 1 - Never  Interpreter Needed?: No  Information entered by :: Dellie Fergusson, LPN   Activities of Daily Living    01/10/2024    8:18 AM 06/10/2023   11:14 AM  In your present state of health, do you have any difficulty performing the following activities:  Hearing? 0 0  Vision? 0 0  Difficulty concentrating or making decisions? 1 0  Comment ADHD   Walking or climbing stairs? 1 0  Comment HARDER TO COME DOWN STAIRS   Dressing or bathing? 0 0  Doing errands, shopping? 0 0  Preparing Food and eating ? N   Using the Toilet? N   In the past six months, have you accidently leaked urine? N   Do you have problems with loss of bowel control? N   Managing your Medications? N   Managing your Finances? N   Housekeeping or managing your Housekeeping? N     Patient Care Team: Sowles, Krichna, MD as PCP - General Hussami, Lucky Sable, LCSW as Social Worker (Licensed Clinical Social Worker) Lydia Sams, Delford Felling, MD as Consulting Physician (Rheumatology) Rica Chalet, MD (Nephrology) Todd Fossa, MD as Consulting Physician (Psychiatry) Erskin Hearing, MD as Consulting Physician (Pulmonary Disease) Morayati, Delaine Favorite, MD as Attending Physician (Endocrinology) Pa, [facility] (Optometry)  Indicate any recent Medical Services you may have received from other than [facility] providers in the past year (date may be approximate).      Assessment:   This is a routine wellness examination for August.  Hearing/Vision screen Hearing Screening - Comments:: NO AIDS Vision Screening - Comments:: WEARS GLASSES ALL DAY- [facility]   Goals Addressed             This Visit's Progress    DIET -  EAT MORE FRUITS AND VEGETABLES         Depression Screen     01/10/2024    8:13 AM 01/01/2024    8:42 AM 10/29/2023    8:09 AM 06/10/2023   11:14 AM 12/25/2022   10:16 AM 12/21/2022    8:37 AM 12/13/2022    2:12 PM  PHQ 2/9 Scores  PHQ - 2 Score 2 6  0  5 5  PHQ- 9 Score 4 21    18 12      Information is confidential and restricted. Go to Review Flowsheets to unlock data.    Fall Risk     01/10/2024    8:17 AM 06/10/2023   11:14 AM 12/21/2022    8:37 AM 12/13/2022    2:08 PM 09/25/2022    7:56 AM  Fall Risk   Falls in the past year? 1 0 0 0 0  Number falls in past yr: 0 0  0 0  Injury with Fall? 0 0  0 0  Risk for fall due to :  No Fall Risks No Fall Risks No Fall Risks No Fall Risks  Follow up Falls prevention discussed;Falls evaluation completed  Falls prevention discussed Education provided;Falls prevention discussed Falls prevention discussed    MEDICARE RISK AT HOME:  Medicare Risk at Home Any stairs in or around the home?: Yes If so, are there any without handrails?: Yes Home free of loose throw rugs in walkways, pet beds, electrical cords, etc?: Yes Adequate lighting in your home to reduce risk of falls?: Yes Life alert?: No Use of a cane, walker or w/c?: No Grab bars in the bathroom?: No Shower chair or bench in shower?: No Elevated toilet seat or a handicapped toilet?: Yes  TIMED UP AND GO:  Was the test performed?  No  Cognitive Function: 6CIT completed        01/10/2024    8:20 AM 12/13/2022    2:25 PM 12/03/2019    8:23 AM 11/28/2018    8:30 AM  6CIT Screen  What Year? 0 points 0 points 0 points 0 points  What month? 0 points 0 points 0 points 0 points  What time? 0 points 0 points 0 points 0  points  Count back from 20 0 points 0 points 0 points 0 points  Months in reverse 0 points 0 points 0 points 0 points  Repeat phrase 0 points 0 points 4 points 0 points  Total Score 0 points 0 points 4 points 0 points    Immunizations Immunization History  Administered Date(s) Administered   Influenza,inj,Quad PF,6+ Mos 05/17/2015, 10/16/2017, 10/08/2018, 08/04/2019, 08/12/2020, 05/23/2021, 06/20/2022   Influenza-Unspecified 07/14/2014   PFIZER(Purple Top)SARS-COV-2 Vaccination 12/10/2019, 12/31/2019, 09/02/2020   PNEUMOCOCCAL CONJUGATE-20 01/01/2024   Pneumococcal Polysaccharide-23 05/25/2013   Tdap 12/30/2009, 09/30/2018   Zoster Recombinant(Shingrix ) 06/13/2021, 12/21/2021    Screening Tests Health Maintenance  Topic Date Due   MAMMOGRAM  10/09/2019   COVID-19 Vaccine (4 - 2024-25 season) 01/17/2024 (Originally 05/26/2023)   INFLUENZA VACCINE  04/24/2024   Medicare Annual Wellness (AWV)  01/09/2025   Colonoscopy  01/28/2025   DTaP/Tdap/Td (3 - Td or Tdap) 09/30/2028   Cervical Cancer Screening (HPV/Pap Cotest)  12/31/2028   Pneumococcal Vaccine 51-12 Years old  Completed   Hepatitis C Screening  Completed   HIV Screening  Completed   Zoster Vaccines- Shingrix   Completed   HPV VACCINES  Aged Out   Meningococcal B Vaccine  Aged Out    Health Maintenance  Health Maintenance Due  Topic Date Due   MAMMOGRAM  10/09/2019   Health Maintenance Items Addressed: UP TO DATE ON SHOTS BUT DOESN'T WANT ANYMORE COVID; HAS APPT TO DO MAMMOGRAM; COLONOSCOPY UP TO DATE  Additional Screening:  Vision Screening: Recommended annual ophthalmology exams for early detection of glaucoma and other disorders of the eye.  Dental Screening: Recommended annual dental exams for proper oral hygiene  Community Resource Referral / Chronic Care Management: CRR required this visit?  No   CCM required this visit?  No     Plan:     I have personally reviewed and noted the following in the  patient's chart:   Medical and social history Use of alcohol, tobacco or illicit drugs  Current medications and supplements including opioid prescriptions. Patient is not currently taking opioid prescriptions. Functional ability and status Nutritional status Physical activity Advanced directives List of other physicians Hospitalizations, surgeries, and ER visits in previous 12 months Vitals Screenings to include cognitive, depression, and falls Referrals and appointments  In addition, I have reviewed and discussed with patient certain preventive protocols, quality metrics, and best practice recommendations. A written personalized care plan for preventive services as well as general preventive health recommendations were provided to patient.     Pinky Bright, LPN   1/61/0960   After Visit Summary: (MyChart) Due to this being a telephonic visit, the after visit summary with patients personalized plan was offered to patient via MyChart   Notes: Nothing significant to report at this time.

## 2024-01-12 ENCOUNTER — Encounter: Payer: Self-pay | Admitting: Family Medicine

## 2024-01-16 ENCOUNTER — Telehealth: Payer: Self-pay | Admitting: Psychiatry

## 2024-01-16 ENCOUNTER — Ambulatory Visit (INDEPENDENT_AMBULATORY_CARE_PROVIDER_SITE_OTHER): Admitting: Professional Counselor

## 2024-01-16 DIAGNOSIS — F331 Major depressive disorder, recurrent, moderate: Secondary | ICD-10-CM | POA: Diagnosis not present

## 2024-01-16 NOTE — Telephone Encounter (Signed)
 Could you check which medication the patient is referring to? According to the chart, she should already have all of her medications.

## 2024-01-16 NOTE — Progress Notes (Signed)
  THERAPIST PROGRESS NOTE  Session Time: 8:02 AM - 8:47 AM   Participation Level: Active  Behavioral Response: Casual, Alert, Dysphoric  Type of Therapy: Individual Therapy  Treatment Goals addressed: Active OP Depression  LTG: "I just want to be happy again. I'm just tired of being sad and unfulfilled. Tired of beating myself up over the past."                Start:  11/12/23    Expected End:  11/11/24      STG: "Dealing with my grief." To move through the stages of grief AEB processing thoughts and feelings in a balanced/healthy way and reporting improvement in mood and daily functioning over the next 12 weeks.    STG: "I guess it would be better if I didn't feel so anxious when they (children) were away from me." To reduce symptoms of anxiety AEB utilizing coping mechanisms and restructuring maladaptive patterns of thinking over the next 12 weeks.    ProgressTowards Goals: Progressing  Interventions: CBT, Motivational Interviewing, and Supportive  Summary: Heather Jefferson is a 53 y.o. female who presents with a history of anxiety, depression, bipolar disorder, and PTSD. She appeared somber but oriented x5. She stated she wanted to stay in bed this morning but she made herself come. Heather Jefferson reported her birthday is tomorrow and even though her family has lunch and dinner plans to celebrate; she is struggling with feeling excited. She discussed how her husband used to celebrate her birthday. She was able to identify something to do for herself - get her hair done and pick out a nice outfit to wear. Heather Jefferson provided an update on getting in touch with DSS about her adopted daughter. She did call and email but hasn't spoken with them directly. She plans to reach back out today. Heather Jefferson reported she was able to go grocery shopping by herself but stated, "it didn't go well." After discussing the situation, she agreed she handled it better than she was giving herself credit. Heather Jefferson appeared more  excited about her birthday plans at the end of session.   Therapist Response: Conducted session with Heather Jefferson. Began session with check-in/update since previous session. Utilized empathetic and reflective listening. Used open-ended questions to facilitate discussion and summarized Heather Jefferson's thoughts/feelings. Reinforced change talk around a "new normal" and encouraged Heather Jefferson to identify something to do for herself for her birthday. Assisted with reframing grocery shopping experience, highlighting how she completed the large trip with all items needed and minimal mental health symptoms. Normalized the physical impact and needing help sometimes. Scheduled additional appointment and concluded session.   Suicidal/Homicidal: No  Plan: Return again in 4 weeks.  Diagnosis: MDD (major depressive disorder), recurrent episode, moderate (HCC)  Collaboration of Care: Medication Management AEB chart review  Patient/Guardian was advised Release of Information must be obtained prior to any record release in order to collaborate their care with an outside provider. Patient/Guardian was advised if they have not already done so to contact the registration department to sign all necessary forms in order for us  to release information regarding their care.   Consent: Patient/Guardian gives verbal consent for treatment and assignment of benefits for services provided during this visit. Patient/Guardian expressed understanding and agreed to proceed.   Len Quale, [facility] 01/16/2024

## 2024-02-04 ENCOUNTER — Ambulatory Visit (INDEPENDENT_AMBULATORY_CARE_PROVIDER_SITE_OTHER): Admitting: Family Medicine

## 2024-02-04 ENCOUNTER — Encounter: Payer: Self-pay | Admitting: Family Medicine

## 2024-02-04 VITALS — BP 136/80 | HR 76 | Resp 16 | Ht 62.0 in | Wt 279.7 lb

## 2024-02-04 DIAGNOSIS — E8881 Metabolic syndrome: Secondary | ICD-10-CM

## 2024-02-04 DIAGNOSIS — N2581 Secondary hyperparathyroidism of renal origin: Secondary | ICD-10-CM

## 2024-02-04 DIAGNOSIS — M797 Fibromyalgia: Secondary | ICD-10-CM

## 2024-02-04 DIAGNOSIS — F3130 Bipolar disorder, current episode depressed, mild or moderate severity, unspecified: Secondary | ICD-10-CM | POA: Diagnosis not present

## 2024-02-04 DIAGNOSIS — E785 Hyperlipidemia, unspecified: Secondary | ICD-10-CM

## 2024-02-04 DIAGNOSIS — E039 Hypothyroidism, unspecified: Secondary | ICD-10-CM

## 2024-02-04 DIAGNOSIS — I10 Essential (primary) hypertension: Secondary | ICD-10-CM

## 2024-02-04 DIAGNOSIS — M059 Rheumatoid arthritis with rheumatoid factor, unspecified: Secondary | ICD-10-CM

## 2024-02-04 DIAGNOSIS — E559 Vitamin D deficiency, unspecified: Secondary | ICD-10-CM

## 2024-02-04 DIAGNOSIS — R739 Hyperglycemia, unspecified: Secondary | ICD-10-CM

## 2024-02-04 DIAGNOSIS — G43109 Migraine with aura, not intractable, without status migrainosus: Secondary | ICD-10-CM

## 2024-02-04 MED ORDER — SUMATRIPTAN 20 MG/ACT NA SOLN: 20.0000 mg | NASAL | 2 refills | Status: AC | PRN

## 2024-02-04 MED ORDER — PREGABALIN 100 MG PO CAPS: 100.0000 mg | ORAL_CAPSULE | Freq: Two times a day (BID) | ORAL | 1 refills | Status: DC

## 2024-02-04 MED ORDER — SEMAGLUTIDE-WEIGHT MANAGEMENT 0.25 MG/0.5ML ~~LOC~~ SOAJ: 0.2500 mg | SUBCUTANEOUS | 0 refills | Status: AC
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
## 2024-02-05 ENCOUNTER — Ambulatory Visit: Payer: Self-pay | Admitting: Family Medicine

## 2024-02-05 ENCOUNTER — Other Ambulatory Visit (HOSPITAL_COMMUNITY): Payer: Self-pay

## 2024-02-05 ENCOUNTER — Encounter: Payer: Self-pay | Admitting: Internal Medicine

## 2024-02-05 ENCOUNTER — Other Ambulatory Visit: Payer: Self-pay | Admitting: Family Medicine

## 2024-02-05 ENCOUNTER — Telehealth: Payer: Self-pay | Admitting: Pharmacy Technician

## 2024-02-05 DIAGNOSIS — E785 Hyperlipidemia, unspecified: Secondary | ICD-10-CM

## 2024-02-05 LAB — LIPID PANEL
Cholesterol: 247 mg/dL — ABNORMAL HIGH (ref <200)
HDL: 63 mg/dL (ref >=50)
LDL Cholesterol (Calc): 159 mg/dL — ABNORMAL HIGH
Non-HDL Cholesterol (Calc): 184 mg/dL — ABNORMAL HIGH (ref <130)
Total CHOL/HDL Ratio: 3.9 (calc) (ref <5.0)
Triglycerides: 130 mg/dL (ref <150)

## 2024-02-05 LAB — HEMOGLOBIN A1C
Hgb A1c MFr Bld: 5.8 % — ABNORMAL HIGH (ref <5.7)
Mean Plasma Glucose: 120 mg/dL
eAG (mmol/L): 6.6 mmol/L

## 2024-02-05 MED ORDER — ROSUVASTATIN CALCIUM 10 MG PO TABS: 10.0000 mg | ORAL_TABLET | Freq: Every day | ORAL | 1 refills | Status: DC

## 2024-02-05 NOTE — Telephone Encounter (Signed)
 Pharmacy Patient Advocate Encounter  Received notification from [employer] that Prior Authorization for Wegovy  0.25MG /0.5ML auto-injectors  has been DENIED.  Full denial letter will be uploaded to the media tab. See denial reason below.    PA #/Case ID/Reference #: [id number]

## 2024-02-05 NOTE — Telephone Encounter (Signed)
 Pharmacy Patient Advocate Encounter   Received notification from CoverMyMeds that prior authorization for Wegovy  0.25MG /0.5ML auto-injectors is required/requested.   Insurance verification completed.   The patient is insured through [facility] .   Per test claim: PA required; PA submitted to above mentioned insurance via CoverMyMeds Key/confirmation #/EOC [id number] Status is pending

## 2024-02-10 ENCOUNTER — Ambulatory Visit (INDEPENDENT_AMBULATORY_CARE_PROVIDER_SITE_OTHER): Admitting: Professional Counselor

## 2024-02-10 DIAGNOSIS — F331 Major depressive disorder, recurrent, moderate: Secondary | ICD-10-CM | POA: Diagnosis not present

## 2024-02-10 DIAGNOSIS — F4321 Adjustment disorder with depressed mood: Secondary | ICD-10-CM | POA: Diagnosis not present
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
## 2024-02-10 NOTE — Progress Notes (Signed)
  THERAPIST PROGRESS NOTE  Session Time: 11:03 AM - 11:56 AM   Participation Level: Active  Behavioral Response: Casual, Alert, Dysphoric  Type of Therapy: Individual Therapy  Treatment Goals addressed:  Active OP Depression  LTG: "I just want to be happy again. I'm just tired of being sad and unfulfilled. Tired of beating myself up over the past."                Start:  11/12/23    Expected End:  11/11/24      STG: "Dealing with my grief." To move through the stages of grief AEB processing thoughts and feelings in a balanced/healthy way and reporting improvement in mood and daily functioning over the next 12 weeks.    STG: "I guess it would be better if I didn't feel so anxious when they (children) were away from me." To reduce symptoms of anxiety AEB utilizing coping mechanisms and restructuring maladaptive patterns of thinking over the next 12 weeks.    ProgressTowards Goals: Progressing  Interventions: CBT, Motivational Interviewing, and Supportive  Summary: Heather Jefferson is a 53 y.o. female who presents with a history of anxiety, depression, bipolar disorder, and PTSD. She appeared somber but oriented x5. She discussed her birthday, mother's day, and her husband's birthday. She also engaged in discussion about her mother and expressed concerns about her mother's health. [name] heard from the social worker about her oldest daughter's information but was not able to open the secure email, nor has she received the mail the social worker was supposed to send. She expressed thoughts, wondering if she's not meant to have this information. However, [name] reported she can try to reach back out to the worker to see if they can send it again.  Therapist Response: Conducted session with [name]. Began session with check-in/update since previous session. Utilized empathetic and reflective listening. Used open-ended questions to facilitate discussion and summarized [name]'s thoughts/feelings.  Normalized experiences with grief, especially reawakened grief around holidays/birthdays. Processed [name]'s thoughts about her mother and discussed various options for seeking treatment for her. Used Socratic questioning to challenge negative thinking and explored solutions to getting information from Child psychotherapist. Scheduled additional appointment and concluded session.   Suicidal/Homicidal: No  Plan: Return again in 3 weeks.  Diagnosis: MDD (major depressive disorder), recurrent episode, moderate (HCC)  Grief  Collaboration of Care: Medication Management AEB chart review  Patient/Guardian was advised Release of Information must be obtained prior to any record release in order to collaborate their care with an outside provider. Patient/Guardian was advised if they have not already done so to contact the registration department to sign all necessary forms in order for us  to release information regarding their care.   Consent: Patient/Guardian gives verbal consent for treatment and assignment of benefits for services provided during this visit. Patient/Guardian expressed understanding and agreed to proceed.   Len Quale, [facility] 02/10/2024

## 2024-02-12 ENCOUNTER — Ambulatory Visit
Admission: RE | Admit: 2024-02-12 | Discharge: 2024-02-12 | Disposition: A | Discharge status: Home / Self Care | Source: Ambulatory Visit | Attending: Family Medicine | Admitting: Family Medicine

## 2024-02-12 DIAGNOSIS — Z1231 Encounter for screening mammogram for malignant neoplasm of breast: Secondary | ICD-10-CM | POA: Insufficient documentation

## 2024-02-18 ENCOUNTER — Other Ambulatory Visit: Payer: Self-pay

## 2024-02-18 ENCOUNTER — Encounter: Payer: Self-pay | Admitting: Psychiatry

## 2024-02-18 ENCOUNTER — Ambulatory Visit (INDEPENDENT_AMBULATORY_CARE_PROVIDER_SITE_OTHER): Admitting: Psychiatry

## 2024-02-18 VITALS — BP 150/96 | HR 66 | Temp 96.6°F | Ht 62.0 in | Wt 286.2 lb

## 2024-02-18 DIAGNOSIS — R632 Polyphagia: Secondary | ICD-10-CM | POA: Diagnosis not present

## 2024-02-18 DIAGNOSIS — F331 Major depressive disorder, recurrent, moderate: Secondary | ICD-10-CM

## 2024-02-18 DIAGNOSIS — G4700 Insomnia, unspecified: Secondary | ICD-10-CM

## 2024-02-18 DIAGNOSIS — F4312 Post-traumatic stress disorder, chronic: Secondary | ICD-10-CM

## 2024-02-18 NOTE — Patient Instructions (Signed)
 Continue Pristiq  100 mg daily Continue bupropion  150 mg daily Continue latuda  20 mg daily   Continue topiramate  25 mg daily  Increase prazosin  4 mg at night Continue temazepam  30 mg at night as needed for insomnia  Next appointment- 7/8 at 2:30

## 2024-02-19 ENCOUNTER — Encounter: Payer: Self-pay | Admitting: Family Medicine

## 2024-02-19 ENCOUNTER — Other Ambulatory Visit: Payer: Self-pay | Admitting: Family Medicine

## 2024-02-19 DIAGNOSIS — E8881 Metabolic syndrome: Secondary | ICD-10-CM

## 2024-02-24 DIAGNOSIS — G4733 Obstructive sleep apnea (adult) (pediatric): Secondary | ICD-10-CM | POA: Diagnosis not present

## 2024-02-24 DIAGNOSIS — R053 Chronic cough: Secondary | ICD-10-CM | POA: Diagnosis not present

## 2024-02-24 DIAGNOSIS — M0609 Rheumatoid arthritis without rheumatoid factor, multiple sites: Secondary | ICD-10-CM | POA: Diagnosis not present

## 2024-02-24 DIAGNOSIS — J4540 Moderate persistent asthma, uncomplicated: Secondary | ICD-10-CM | POA: Diagnosis not present

## 2024-02-24 NOTE — Progress Notes (Signed)
 DIVISION OF PULMONARY AND CRITICAL CARE MEDICINE                              FOLLOW UP ENCOUNTER     Chief complaint: Chronic cough with Asthma and OSA  History of Present Illness Heather Jefferson is a 53 year old female with asthma who presents with persistent cough.  She has been experiencing a persistent, non-productive cough that has worsened over the past month, severe enough to interfere with daily activities such as attending church. She experiences episodes of coughing to the point of being unable to catch her breath, though she denies shortness of breath. The cough has a cold-like sound, and she perceives wheezing in her throat rather than her chest.  She has a history of asthma and is currently using albuterol and Advair inhalers. Despite this treatment, she continues to experience significant coughing and wheezing. She has not taken oral steroids like prednisone recently and does not recall if they have previously helped her cough.  Her rheumatoid arthritis causes significant stiffness and pain, particularly in the mornings. She takes Plaquenil daily and Lyrica  twice a day, although she skipped her morning dose today due to its sedative effects.  She has been diagnosed with sleep apnea and uses a CPAP machine, but can only tolerate it for about four hours due to coughing and waking with a dry mouth.  She is experiencing difficulty with weight loss, which she attributes to her medications. She was previously on Trulicity and was supposed to switch to Wegovy , but her insurance denied it.   Past Medical History:   Past Medical History:  Diagnosis Date  . ADD (attention deficit disorder) without hyperactivity   . Anemia   . Anxiety and depression   . Asthma, unspecified asthma severity, unspecified whether complicated, unspecified whether persistent (HHS-HCC)   . Bruxism   . Fatigue   . Fibromyalgia   . GERD (gastroesophageal reflux disease)    . Herpes zoster   . Hyperlipidemia   . Hypertension   . Insomnia   . Malaise   . Migraine headache   . Obesity   . Raynaud's phenomenon   . Rhinitis   . Seizures (CMS/HHS-HCC)   . Sleep apnea   . Thyroid  disease   . Vitamin E deficiency     Past Surgical History:   Past Surgical History:  Procedure Laterality Date  . CORNEAL EYE SURGERY Right 12/20/2015   DMEK KERATOPLASTY OD by Dr. Jerel Cramp   . COLONOSCOPY    . LAPAROSCOPIC TUBAL LIGATION      Allergies:   Allergies  Allergen Reactions  . Augmentin [Amoxicillin-Pot Clavulanate] Hives  . Losartan Cough  . Metformin  Diarrhea, Nausea And Vomiting and Other (See Comments)    Current Medications:   Prior to Admission medications   Medication Sig Taking? Last Dose  amLODIPine (NORVASC) 5 MG tablet Take 5 mg by mouth once daily Yes Taking  buPROPion  (WELLBUTRIN  XL) 150 MG XL tablet Take 150 mg by mouth once daily Yes Taking  desvenlafaxine  succinate (PRISTIQ ) 100 MG ER tablet Take 100 mg by mouth once daily Yes Taking  fluticasone  propion-salmeteroL (ADVAIR HFA) 115-21 mcg/actuation inhaler Inhale 2 inhalations into the lungs every 12 (twelve) hours Yes Taking  hydroxychloroquine (PLAQUENIL)  200 mg tablet Take 1 tablet (200 mg total) by mouth 2 (two) times daily Yes Taking  levothyroxine  (SYNTHROID ) 50 MCG tablet TAKE 1 TABLET BY MOUTH ONCE DAILY ON AN EMPTY STOMACH WITH A GLASS OF WATER AT LEAST 30-60 MINUTES BEFORE BREAKFAST Yes Taking  lurasidone  (LATUDA ) 20 mg tablet Take 20 mg by mouth once daily Yes Taking  MIRENA 20 mcg/24 hr (5 years) IUD Insert into the uterus Yes Taking  pantoprazole (PROTONIX) 40 MG DR tablet TAKE 1 TABLET BY MOUTH TWICE DAILY BEFORE MEAL(S) Yes Taking  potassium chloride  (KLOR-CON ) 8 MEQ ER tablet Take 8 mEq by mouth once daily Yes Taking  prazosin  (MINIPRESS ) 2 MG capsule Take 2 mg by mouth at bedtime Yes Taking  prednisoLONE acetate (PRED FORTE) 1 % ophthalmic suspension INSTILL 1 DROP  INTO RIGHT EYE ONCE DAILY Yes Taking  prednisoLONE acetate (PRED FORTE) 1 % ophthalmic suspension Place 1 drop into the right eye once daily Yes Taking  pregabalin  (LYRICA ) 50 MG capsule Take 100 mg by mouth 2 (two) times daily Yes Taking  SUMAtriptan  (IMITREX ) 20 mg/actuation nasal spray as needed.   Yes Taking  temazepam  (RESTORIL ) 15 mg capsule Take 30 mg by mouth at bedtime Yes Taking  topiramate  (TOPAMAX ) 25 MG tablet Take 25 mg by mouth once daily Yes Taking  albuterol 90 mcg/actuation inhaler Inhale 2 inhalations into the lungs every 6 (six) hours as needed for Wheezing for up to 90 days    dulaglutide (TRULICITY SUBQ) Inject subcutaneously every morning Patient not taking: Reported on 02/24/2024  Not Taking  metformin  HCl (METFORMIN  ORAL) Take 500 mg by mouth daily with breakfast Patient not taking: Reported on 02/24/2024  Not Taking  predniSONE (DELTASONE) 5 MG tablet Take 1 tablet (5 mg total) by mouth once daily for 30 days      Family History:   Family History  Problem Relation Name Age of Onset  . Headaches Mother Fidela Monte   . Coronary Artery Disease (Blocked arteries around heart) Mother Fidela Monte   . High blood pressure (Hypertension) Mother Fidela Monte   . Stroke Mother Fidela Monte   . Asthma Mother Fidela Monte   . Diabetes Father Unknown   . High blood pressure (Hypertension) Father Unknown   . Coronary Artery Disease (Blocked arteries around heart) Father Unknown   . High blood pressure (Hypertension) Sister Allena Meres   . Migraines Brother Antione Monte   . High blood pressure (Hypertension) Brother Guadelupe Monte   . ADD / ADHD Daughter    . ADD / ADHD Son    . Coronary Artery Disease (Blocked arteries around heart) Maternal Grandmother [name] Monte   . Diabetes Maternal Grandmother [name] Monte   . High blood pressure (Hypertension) Maternal Grandmother [name] Monte   . Tremor Maternal Grandmother [name] Monte   . Coronary Artery Disease  (Blocked arteries around heart) Maternal Grandfather Unknown   . Diabetes Maternal Grandfather Unknown   . High blood pressure (Hypertension) Maternal Grandfather Unknown   . Anesthesia problems Neg Hx      Social History:   Social History   Socioeconomic History  . Marital status: Married  Occupational History  . Occupation: Disability  Tobacco Use  . Smoking status: Never    Passive exposure: Never  . Smokeless tobacco: Never  Vaping Use  . Vaping status: Never Used  Substance and Sexual Activity  . Alcohol use: Not Currently    Comment: rare, on holidays  . Drug use: No  .  Sexual activity: Yes    Partners: Male    Birth control/protection: I.U.D.  Social History Narrative   Education:HS   Occupation: Associate Professor   Hobbies:    Marital Status: married   Social Drivers of Corporate investment banker Strain: Low Risk  (01/10/2024)   Received from [facility]   Overall Financial Resource Strain (CARDIA)   . Difficulty of Paying Living Expenses: Not very hard  Food Insecurity: No Food Insecurity (01/10/2024)   Received from [facility]   Hunger Vital Sign   . Worried About Programme researcher, broadcasting/film/video in the Last Year: Never true   . Ran Out of Food in the Last Year: Never true  Transportation Needs: No Transportation Needs (01/10/2024)   Received from [facility] - Transportation   . Lack of Transportation (Medical): No   . Lack of Transportation (Non-Medical): No  Physical Activity: Inactive (01/10/2024)   Received from [facility]   Exercise Vital Sign   . Days of Exercise per Week: 0 days   . Minutes of Exercise per Session: 0 min  Stress: No Stress Concern Present (01/10/2024)   Received from [facility]ational [facility] - Occupational Stress Questionnaire   . Feeling of Stress : Only a little  Recent Concern: Stress - Stress Concern Present (10/29/2023)   Received from [facility] of Occupational Health -  Occupational Stress Questionnaire   . Feeling of Stress : Very much  Social Connections: Socially Isolated (01/10/2024)   Received from [facility]   Social Connection and Isolation Panel [NHANES]   . Frequency of Communication with Friends and Family: Twice a week   . Frequency of Social Gatherings with Friends and Family: Never   . Attends Religious Services: More than 4 times per year   . Active Member of Clubs or Organizations: No   . Attends Banker Meetings: Never   . Marital Status: Widowed  Housing Stability: Low Risk  (01/10/2024)   Received from [facility] Stability Vital Sign   . Unable to Pay for Housing in the Last Year: No   . Number of Times Moved in the Last Year: 0   . Homeless in the Last Year: No    Review of Systems:   A 10 point review of systems is negative, except for the pertinent positives and negatives detailed in the HPI.  Vitals:   Vitals:   02/24/24 0805  BP: (!) 143/87  BP Location: Right forearm  Patient Position: Sitting  BP Cuff Size: Adult  Pulse: 66  SpO2: 99%  Weight: (!) 130.5 kg (287 lb 12.8 oz)  Height: 157.5 cm (5' 2)     Body mass index is 52.64 kg/m.  Physical Exam:  Physical Exam Vitals and nursing note reviewed.  Constitutional:      General: She is not in acute distress.    Appearance: Normal appearance. She is obese. She is not ill-appearing, toxic-appearing or diaphoretic.  HENT:     Head: Normocephalic and atraumatic.     Right Ear: External ear normal.     Left Ear: External ear normal.  Eyes:     General:        Right eye: No discharge.        Left eye: No discharge.     Extraocular Movements: Extraocular movements intact.     Pupils: Pupils are equal, round, and reactive to light.  Cardiovascular:     Rate and Rhythm: Normal rate and regular rhythm.     Pulses: Normal pulses.     Heart sounds: Normal heart sounds. No murmur heard.    No friction rub. No gallop.  Abdominal:      General: Bowel sounds are normal.  Skin:    General: Skin is warm and dry.     Capillary Refill: Capillary refill takes less than 2 seconds.  Neurological:     Mental Status: She is alert.     Lab and Imaging Results:  Results     Assessment and Plan:   Diagnoses and all orders for this visit:  Chronic cough  Rheumatoid arthritis of multiple sites with negative rheumatoid factor (CMS/HHS-HCC)  OSA (obstructive sleep apnea)  Morbid obesity with BMI of 50.0-59.9, adult (CMS/HHS-HCC)  Moderate persistent asthma, unspecified whether complicated (HHS-HCC) -     Allergens, Zone 3 - [facility] -     Allergen Profile, Mold - [facility] -     Hypersensitivity Pneumonitis - [facility] -     C-Reactive Protein, Quant - [facility] -     D-Dimer - [facility]  Other orders -     predniSONE (DELTASONE) 5 MG tablet; Take 1 tablet (5 mg total) by mouth once daily for 30 days    Assessment & Plan Asthma Asthma exacerbation with persistent cough and wheezing, significantly affecting quality of life. Current treatment with Advair and albuterol is insufficient. Considering inflammation as a contributing factor. Discussed potential use of Nucala for difficult-to-control asthma, especially in the context of autoimmune conditions. Nucala is an injectable medication used once a month, beneficial for inflammation control in autoimmune conditions. - Initiate a trial of prednisone 5 mg daily for 30 days to assess response and inflammation control. - Provide educational material on Nucala for consideration in the future. - Order refills for Advair and albuterol inhalers.  Chronic cough Chronic cough persisting for the past month, non-productive, and not associated with shortness of breath. No evidence of lung scarring or nodules on previous CT scan. Possible link to asthma and inflammation. Prednisone trial will help determine if inflammation is a contributing factor.  Obstructive Sleep Apnea (OSA) OSA with  difficulty maintaining CPAP use due to coughing and dry mouth. Mouth dryness may be due to sleeping with mouth open while using CPAP.  Rheumatoid arthritis Rheumatoid arthritis with morning stiffness and pain. Current treatment includes Plaquenil and Lyrica . Symptoms may contribute to overall inflammation and respiratory issues.  Obesity Obesity with challenges in weight loss, possibly due to medication effects. Previous use of Trulicity discontinued, and Wegovy  was denied by insurance. Referral to a nutritionist has been made to assist with weight management.   I spent a total of 36 minutes in both face-to-face and non-face-to-face activities, excluding procedures performed, for this visit on the date of this encounter.     This note has been created using dictation software tool and any typographical errors are purely unintentional.  Patient received an After Visit Summary

## 2024-02-27 ENCOUNTER — Encounter: Payer: Self-pay | Admitting: Internal Medicine

## 2024-03-02 ENCOUNTER — Ambulatory Visit (INDEPENDENT_AMBULATORY_CARE_PROVIDER_SITE_OTHER): Admitting: Professional Counselor

## 2024-03-02 DIAGNOSIS — F331 Major depressive disorder, recurrent, moderate: Secondary | ICD-10-CM | POA: Diagnosis not present

## 2024-03-02 DIAGNOSIS — F4312 Post-traumatic stress disorder, chronic: Secondary | ICD-10-CM

## 2024-03-02 DIAGNOSIS — F4321 Adjustment disorder with depressed mood: Secondary | ICD-10-CM

## 2024-03-02 NOTE — Progress Notes (Addendum)
  THERAPIST PROGRESS NOTE  Session Time: 8:00 AM - 8:48 AM  Participation Level: Active  Behavioral Response: Well Groomed, Alert, Dysphoric  Type of Therapy: Individual Therapy  Treatment Goals addressed: Active OP Depression  LTG: "I just want to be happy again. I'm just tired of being sad and unfulfilled. Tired of beating myself up over the past."                Start:  11/12/23    Expected End:  11/11/24      STG: "Dealing with my grief." To move through the stages of grief AEB processing thoughts and feelings in a balanced/healthy way and reporting improvement in mood and daily functioning over the next 12 weeks.    STG: "I guess it would be better if I didn't feel so anxious when they (children) were away from me." To reduce symptoms of anxiety AEB utilizing coping mechanisms and restructuring maladaptive patterns of thinking over the next 12 weeks.    ProgressTowards Goals: Progressing  Interventions: CBT, Motivational Interviewing, and Supportive  Summary: Heather Jefferson is a 53 y.o. female who presents with a history of anxiety, depression, bipolar disorder, and PTSD. She appeared dysphoric but oriented x5. She stated she woke up with a lot of emotions. [name] shared her recent concerns, mostly in relation to her oldest daughter/DSS and her mother. She was receptive to [employer]. [name] expressed thoughts of wanting to stay no contact with her mother at this time. She is open to using skill to ask DSS for more options to getting the information about her daughter though. She reported feeling a little better after session. [name] expressed understanding about homework assignment.  Therapist Response: Conducted session with [name]. Began session with check-in/update since previous session. Utilized empathetic and reflective listening. Used open-ended questions to facilitate discussion and summarized [name]'s thoughts/feelings. Explained DEAR MAN skill and modeled how to use  with [name]'s situations. Used Socratic questioning to challenge thinking around mother/daughter relationships and normalized [name]'s feelings. Assigned homework to complete gratitude journal to help [name] focus on positives/big picture. Scheduled additional appointment and concluded session.   Suicidal/Homicidal: No  Plan: Return again in 4 weeks.  Diagnosis: MDD (major depressive disorder), recurrent episode, moderate (HCC)  Chronic post-traumatic stress disorder (PTSD)  Grief  Collaboration of Care: Medication Management AEB chart review  Patient/Guardian was advised Release of Information must be obtained prior to any record release in order to collaborate their care with an outside provider. Patient/Guardian was advised if they have not already done so to contact the registration department to sign all necessary forms in order for us  to release information regarding their care.   Consent: Patient/Guardian gives verbal consent for treatment and assignment of benefits for services provided during this visit. Patient/Guardian expressed understanding and agreed to proceed.   Len Quale, [facility] 03/02/2024

## 2024-03-25 DIAGNOSIS — J849 Interstitial pulmonary disease, unspecified: Secondary | ICD-10-CM | POA: Diagnosis not present

## 2024-03-25 DIAGNOSIS — J4530 Mild persistent asthma, uncomplicated: Secondary | ICD-10-CM | POA: Insufficient documentation

## 2024-03-25 DIAGNOSIS — J4540 Moderate persistent asthma, uncomplicated: Secondary | ICD-10-CM | POA: Diagnosis not present

## 2024-03-25 DIAGNOSIS — R053 Chronic cough: Secondary | ICD-10-CM | POA: Insufficient documentation

## 2024-03-25 DIAGNOSIS — G4733 Obstructive sleep apnea (adult) (pediatric): Secondary | ICD-10-CM | POA: Diagnosis not present

## 2024-03-25 NOTE — Progress Notes (Signed)
 DIVISION OF PULMONARY AND CRITICAL CARE MEDICINE                              FOLLOW UP ENCOUNTER     Chief complaint: Uncontrolled Severe persistent Asthma with prednisone dependence  History of Present Illness Heather Jefferson is a 53 year old female with asthma who presents for follow-up of her respiratory condition.  Her asthma has shown some improvement recently, with only one episode requiring her inhaler due to a sudden onset of coughing. She dislikes the Advair Diskus because of its taste, which she describes as 'disgusting' and similar to having thrush in her mouth. She prefers the Ventolin inhaler and previously used an Advair HFA inhaler, which she found more agreeable.  She experiences nighttime awakenings due to coughing, which interrupts her use of CPAP therapy. She can perform daily activities such as shopping at [facility] without experiencing shortness of breath, but she has not attempted more strenuous activities like power walking.  Her current medications include a low dose of prednisone and inhalers, including Advair Diskus and Ventolin. She wants to discontinue prednisone due to concerns about its long-term use.   Past Medical History:   Past Medical History:  Diagnosis Date  . ADD (attention deficit disorder) without hyperactivity   . Anemia   . Anxiety and depression   . Asthma, unspecified asthma severity, unspecified whether complicated, unspecified whether persistent (HHS-HCC)   . Bruxism   . Fatigue   . Fibromyalgia   . GERD (gastroesophageal reflux disease)   . Herpes zoster   . Hyperlipidemia   . Hypertension   . Insomnia   . Malaise   . Migraine headache   . Obesity   . Raynaud's phenomenon   . Rhinitis   . Seizures (CMS/HHS-HCC)   . Sleep apnea   . Thyroid  disease   . Vitamin E deficiency     Past Surgical History:   Past Surgical History:  Procedure Laterality Date  . CORNEAL EYE SURGERY Right 12/20/2015    DMEK KERATOPLASTY OD by Dr. Jerel Cramp   . COLONOSCOPY    . LAPAROSCOPIC TUBAL LIGATION      Allergies:   Allergies  Allergen Reactions  . Augmentin [Amoxicillin-Pot Clavulanate] Hives  . Losartan Cough  . Metformin  Diarrhea, Nausea And Vomiting and Other (See Comments)    Current Medications:   Prior to Admission medications  Medication Sig Taking? Last Dose  albuterol 90 mcg/actuation inhaler Inhale 2 inhalations into the lungs every 6 (six) hours as needed for Wheezing for up to 90 days Yes Taking  amLODIPine (NORVASC) 5 MG tablet Take 5 mg by mouth once daily Yes Taking  buPROPion  (WELLBUTRIN  XL) 150 MG XL tablet Take 150 mg by mouth once daily Yes Taking  desvenlafaxine  succinate (PRISTIQ ) 100 MG ER tablet Take 100 mg by mouth once daily Yes Taking  hydroxychloroquine (PLAQUENIL) 200 mg tablet Take 1 tablet (200 mg total) by mouth 2 (two) times daily Yes Taking  levothyroxine  (SYNTHROID ) 50 MCG tablet TAKE 1 TABLET BY MOUTH ONCE DAILY ON AN EMPTY STOMACH WITH A GLASS OF WATER AT LEAST 30-60 MINUTES BEFORE BREAKFAST Yes Taking  lurasidone  (LATUDA ) 20 mg tablet Take 20 mg by mouth once daily Yes Taking  MIRENA 20 mcg/24 hr (  5 years) IUD Insert into the uterus Yes Taking  pantoprazole (PROTONIX) 40 MG DR tablet TAKE 1 TABLET BY MOUTH TWICE DAILY BEFORE MEAL(S) Yes Taking  potassium chloride  (KLOR-CON ) 8 MEQ ER tablet Take 8 mEq by mouth once daily Yes Taking  prazosin  (MINIPRESS ) 2 MG capsule Take 2 mg by mouth at bedtime Yes Taking  prednisoLONE acetate (PRED FORTE) 1 % ophthalmic suspension INSTILL 1 DROP INTO RIGHT EYE ONCE DAILY Yes Taking  predniSONE (DELTASONE) 5 MG tablet Take 1 tablet (5 mg total) by mouth once daily for 30 days Yes Taking  pregabalin  (LYRICA ) 50 MG capsule Take 100 mg by mouth 2 (two) times daily Yes Taking  SUMAtriptan  (IMITREX ) 20 mg/actuation nasal spray as needed.   Yes Taking  temazepam  (RESTORIL ) 15 mg capsule Take 30 mg by mouth at bedtime Yes  Taking  topiramate  (TOPAMAX ) 25 MG tablet Take 25 mg by mouth once daily Yes Taking  albuterol MDI, PROVENTIL, VENTOLIN, PROAIR, HFA 90 mcg/actuation inhaler Inhale 2 inhalations into the lungs every 6 (six) hours as needed for Wheezing Patient not taking: Reported on 03/25/2024  Not Taking  dulaglutide (TRULICITY SUBQ) Inject subcutaneously every morning Patient not taking: Reported on 03/25/2024  Not Taking  fluticasone  propion-salmeteroL (ADVAIR HFA) 230-21 mcg/actuation inhaler Inhale 2 inhalations into the lungs every 12 (twelve) hours    metformin  HCl (METFORMIN  ORAL) Take 500 mg by mouth daily with breakfast Patient not taking: Reported on 03/25/2024  Not Taking  prednisoLONE acetate (PRED FORTE) 1 % ophthalmic suspension Place 1 drop into the right eye once daily Patient not taking: Reported on 03/25/2024  Not Taking    Family History:   Family History  Problem Relation Name Age of Onset  . Headaches Mother Fidela Monte   . Coronary Artery Disease (Blocked arteries around heart) Mother Fidela Monte   . High blood pressure (Hypertension) Mother Fidela Monte   . Stroke Mother Fidela Monte   . Asthma Mother Fidela Monte   . Diabetes Father Unknown   . High blood pressure (Hypertension) Father Unknown   . Coronary Artery Disease (Blocked arteries around heart) Father Unknown   . High blood pressure (Hypertension) Sister Allena Meres   . Migraines Brother Antione Monte   . High blood pressure (Hypertension) Brother Guadelupe Monte   . ADD / ADHD Daughter    . ADD / ADHD Son    . Coronary Artery Disease (Blocked arteries around heart) Maternal Grandmother [name] Monte   . Diabetes Maternal Grandmother [name] Monte   . High blood pressure (Hypertension) Maternal Grandmother [name] Monte   . Tremor Maternal Grandmother [name] Monte   . Coronary Artery Disease (Blocked arteries around heart) Maternal Grandfather Unknown   . Diabetes Maternal Grandfather Unknown   . High blood  pressure (Hypertension) Maternal Grandfather Unknown   . Anesthesia problems Neg Hx      Social History:   Social History   Socioeconomic History  . Marital status: Married  Occupational History  . Occupation: Disability  Tobacco Use  . Smoking status: Never    Passive exposure: Never  . Smokeless tobacco: Never  Vaping Use  . Vaping status: Never Used  Substance and Sexual Activity  . Alcohol use: Not Currently    Comment: rare, on holidays  . Drug use: No  . Sexual activity: Yes    Partners: Male    Birth control/protection: I.U.D.  Social History Narrative   Education:HS   Occupation: Nurse, children's:    Marital  Status: married   Social Drivers of Corporate investment banker Strain: Low Risk  (01/10/2024)   [facility]   Overall Financial Resource Strain (CARDIA)   . Difficulty of Paying Living Expenses: Not very hard  Food Insecurity: No Food Insecurity (01/10/2024)   Received from [facility]   Hunger Vital Sign   . Within the past 12 months, you worried that your food would run out before you got the money to buy more.: Never true   . Within the past 12 months, the food you bought just didn't last and you didn't have money to get more.: Never true  Transportation Needs: No Transportation Needs (01/10/2024)   Received from [facility] - Transportation   . Lack of Transportation (Medical): No   . Lack of Transportation (Non-Medical): No  Physical Activity: Inactive (01/10/2024)   Received from [facility]   Exercise Vital Sign   . On average, how many days per week do you engage in moderate to strenuous exercise (like a brisk walk)?: 0 days   . On average, how many minutes do you engage in exercise at this level?: 0 min  Stress: No Stress Concern Present (01/10/2024)   Received from [facility] - Occupational Stress Questionnaire   . Feeling of Stress : Only a little  Recent Concern:  Stress - Stress Concern Present (10/29/2023)   Received from [facility] - Occupational Stress Questionnaire   . Feeling of Stress : Very much  Social Connections: Socially Isolated (01/10/2024)   Received from [facility]   Social Connection and Isolation Panel   . In a typical week, how many times do you talk on the phone with family, friends, or neighbors?: Twice a week   . How often do you get together with friends or relatives?: Never   . How often do you attend church or religious services?: More than 4 times per year   . Do you belong to any clubs or organizations such as church groups, unions, fraternal or athletic groups, or school groups?: No   . How often do you attend meetings of the clubs or organizations you belong to?: Never   . Are you married, widowed, divorced, separated, never married, or living with a partner?: Widowed  Housing Stability: Low Risk  (10/29/2023)   Received from [facility] Stability Vital Sign   . Unable to Pay for Housing in the Last Year: No   . Number of Times Moved in the Last Year: 0   . Homeless in the Last Year: No    Review of Systems:   A 10 point review of systems is negative, except for the pertinent positives and negatives detailed in the HPI.  Vitals:   Vitals:   03/25/24 0901  BP: 100/67  BP Location: Right forearm  Patient Position: Sitting  BP Cuff Size: Adult  Pulse: 77  SpO2: 99%  Weight: (!) 130.7 kg (288 lb 2.3 oz)  Height: 157.5 cm (5' 2)     Body mass index is 52.7 kg/m.  Physical Exam:  Physical Exam Vitals and nursing note reviewed.  Constitutional:      General: She is not in acute distress.    Appearance: Normal appearance. She is not ill-appearing, toxic-appearing or diaphoretic.  HENT:     Head: Normocephalic and atraumatic.     Right Ear: External ear normal.  Left Ear: External ear normal.   Eyes:     General:        Right eye: No discharge.         Left eye: No discharge.     Extraocular Movements: Extraocular movements intact.     Pupils: Pupils are equal, round, and reactive to light.    Cardiovascular:     Rate and Rhythm: Normal rate and regular rhythm.     Pulses: Normal pulses.     Heart sounds: Normal heart sounds. No murmur heard.    No friction rub. No gallop.  Abdominal:     General: Bowel sounds are normal.   Skin:    General: Skin is warm and dry.     Capillary Refill: Capillary refill takes less than 2 seconds.   Neurological:     Mental Status: She is alert.     Lab and Imaging Results:   Results LABS CRP: elevated  DIAGNOSTIC FEV1: 1.9 L (89%) (03/25/2024) Total lung capacity: 80% (03/25/2024) DLCO: 100% (03/25/2024)    Assessment and Plan:   Diagnoses and all orders for this visit:  Moderate persistent asthma, unspecified whether complicated (HHS-HCC)  OSA on CPAP  Morbid obesity with BMI of 50.0-59.9, adult (CMS/HHS-HCC)  Other orders -     fluticasone  propion-salmeteroL (ADVAIR HFA) 230-21 mcg/actuation inhaler; Inhale 2 inhalations into the lungs every 12 (twelve) hours    Assessment & Plan Uncontrolled moderate persistent asthma Asthma remains uncontrolled despite current treatment regimen, including prednisone. The asthma control test score is 16, indicating poor control (target score >22). She experiences nighttime awakenings due to coughing, which interferes with CPAP use for OSA. She dislikes the Advair Diskus due to its taste and sensation, preferring the Ventolin inhaler. Transitioning to an Advair HFA inhaler is suggested. An injectable medication is considered for better control, given the elevated CRP indicating inflammation. The injectable is administered once a month, and after a while, once every two months. It is easy to administer at home and may improve asthma control significantly. - Discontinue Advair Diskus and prescribe Advair HFA inhaler. - Order injectable  medication for asthma control, pending insurance approval. - Continue current inhalers until injectable medication is initiated.  Chronic cough Chronic cough is likely related to uncontrolled asthma, as evidenced by nighttime awakenings and the need to remove the CPAP mask. The cough persists despite current asthma management strategies.  Obstructive Sleep Apnea (OSA) OSA management is complicated by asthma-related nighttime coughing, which necessitates removal of the CPAP mask. This impacts the effectiveness of OSA treatment.   I spent a total of 46 minutes in both face-to-face and non-face-to-face activities, excluding procedures performed, for this visit on the date of this encounter.     This note has been created using dictation software tool and any typographical errors are purely unintentional.  Patient received an After Visit Summary

## 2024-03-26 NOTE — Progress Notes (Signed)
 [employer] enrollment form completed and hand signed by MD. Given to April B for PA to be initiated with office visit note and demographics attached.
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
## 2024-03-30 ENCOUNTER — Ambulatory Visit (INDEPENDENT_AMBULATORY_CARE_PROVIDER_SITE_OTHER): Admitting: Professional Counselor

## 2024-03-30 DIAGNOSIS — F331 Major depressive disorder, recurrent, moderate: Secondary | ICD-10-CM

## 2024-03-30 DIAGNOSIS — F4321 Adjustment disorder with depressed mood: Secondary | ICD-10-CM | POA: Diagnosis not present

## 2024-03-30 DIAGNOSIS — F4312 Post-traumatic stress disorder, chronic: Secondary | ICD-10-CM

## 2024-03-30 NOTE — Progress Notes (Signed)
  THERAPIST PROGRESS NOTE  Session Time: 8:04 AM - 8:57 AM   Participation Level: Active  Behavioral Response: Casual, Alert, Dysphoric  Type of Therapy: Individual Therapy  Treatment Goals addressed:  Active OP Depression  LTG: I just want to be happy again. I'm just tired of being sad and unfulfilled. Tired of beating myself up over the past.                Start:  11/12/23    Expected End:  11/11/24      STG: Dealing with my grief. To move through the stages of grief AEB processing thoughts and feelings in a balanced/healthy way and reporting improvement in mood and daily functioning over the next 12 weeks.    STG: I guess it would be better if I didn't feel so anxious when they (children) were away from me. To reduce symptoms of anxiety AEB utilizing coping mechanisms and restructuring maladaptive patterns of thinking over the next 12 weeks.    ProgressTowards Goals: Progressing  Interventions: Motivational Interviewing, Solution Focused, and Supportive  Summary: Heather Jefferson is a 53 y.o. female who presents with a history of anxiety, depression, bipolar disorder, and PTSD. She appeared alert and oriented x5. She reported ongoing concerns with her mother's health. She took note of mobile crisis resource. [name] discussed her recent experience with grief. She noted she can try to use boundaries when she feels unable to talk about her husband. [name] finally received information about her daughter that was adopted out. She took note of other possible resources to help locate her daughter. [name] reported she was feeling better and this session was helpful.   Therapist Response: Conducted session with [name]. Began session with check-in/update since previous session. Utilized empathetic and reflective listening. Used open-ended questions to facilitate discussion and summarized [name]'s thoughts/feelings. Explored various resources and solutions to current stressors/situations.  Scheduled additional appointment and concluded session.   Suicidal/Homicidal: No  Plan: Return again in 3 weeks.  Diagnosis: MDD (major depressive disorder), recurrent episode, moderate (HCC)  Chronic post-traumatic stress disorder (PTSD)  Grief  Collaboration of Care: Medication Management AEB chart review  Patient/Guardian was advised Release of Information must be obtained prior to any record release in order to collaborate their care with an outside provider. Patient/Guardian was advised if they have not already done so to contact the registration department to sign all necessary forms in order for us  to release information regarding their care.   Consent: Patient/Guardian gives verbal consent for treatment and assignment of benefits for services provided during this visit. Patient/Guardian expressed understanding and agreed to proceed.   [name], [facility] 03/30/2024

## 2024-03-31 ENCOUNTER — Other Ambulatory Visit: Payer: Self-pay

## 2024-03-31 ENCOUNTER — Ambulatory Visit (INDEPENDENT_AMBULATORY_CARE_PROVIDER_SITE_OTHER): Admitting: Psychiatry

## 2024-03-31 ENCOUNTER — Encounter: Payer: Self-pay | Admitting: Psychiatry

## 2024-03-31 VITALS — BP 154/93 | HR 70 | Temp 97.5°F | Ht 62.0 in | Wt 288.4 lb

## 2024-03-31 DIAGNOSIS — F4312 Post-traumatic stress disorder, chronic: Secondary | ICD-10-CM | POA: Diagnosis not present

## 2024-03-31 DIAGNOSIS — G4700 Insomnia, unspecified: Secondary | ICD-10-CM

## 2024-03-31 DIAGNOSIS — R632 Polyphagia: Secondary | ICD-10-CM

## 2024-03-31 DIAGNOSIS — F331 Major depressive disorder, recurrent, moderate: Secondary | ICD-10-CM

## 2024-03-31 MED ORDER — LURASIDONE HCL 20 MG PO TABS: 20.0000 mg | ORAL_TABLET | Freq: Every day | ORAL | 0 refills | Status: DC

## 2024-03-31 MED ORDER — TEMAZEPAM 30 MG PO CAPS: 30.0000 mg | ORAL_CAPSULE | Freq: Every evening | ORAL | 0 refills | Status: DC | PRN

## 2024-03-31 MED ORDER — PRAZOSIN HCL 2 MG PO CAPS: 4.0000 mg | ORAL_CAPSULE | Freq: Every day | ORAL | 0 refills | Status: DC

## 2024-03-31 MED ORDER — TOPIRAMATE 50 MG PO TABS: 50.0000 mg | ORAL_TABLET | Freq: Every day | ORAL | 1 refills | Status: DC

## 2024-03-31 NOTE — Patient Instructions (Signed)
 Continue Pristiq  100 mg daily Continue bupropion  150 mg daily  Continue latuda  20 mg daily   Increase topiramate  50 mg daily  Increase prazosin  4 mg at night Continue temazepam  30 mg at night as needed for insomnia  Next appointment- 8/14 at 11 AM

## 2024-04-13 ENCOUNTER — Encounter: Payer: Self-pay | Admitting: Family Medicine

## 2024-04-15 ENCOUNTER — Ambulatory Visit (INDEPENDENT_AMBULATORY_CARE_PROVIDER_SITE_OTHER): Admitting: Family Medicine

## 2024-04-15 ENCOUNTER — Encounter: Payer: Self-pay | Admitting: Family Medicine

## 2024-04-15 VITALS — BP 126/72 | HR 76 | Temp 98.6°F | Resp 18 | Ht 62.0 in | Wt 290.1 lb

## 2024-04-15 DIAGNOSIS — Z113 Encounter for screening for infections with a predominantly sexual mode of transmission: Secondary | ICD-10-CM | POA: Diagnosis not present
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
## 2024-04-16 LAB — RPR: RPR Ser Ql: NONREACTIVE

## 2024-04-16 LAB — HEPATITIS PANEL, ACUTE
Hep A IgM: NONREACTIVE
Hep B C IgM: NONREACTIVE
Hepatitis B Surface Ag: NONREACTIVE
Hepatitis C Ab: NONREACTIVE

## 2024-04-16 LAB — HIV ANTIBODY (ROUTINE TESTING W REFLEX): HIV 1&2 Ab, 4th Generation: NONREACTIVE

## 2024-04-17 ENCOUNTER — Ambulatory Visit: Payer: Self-pay | Admitting: Family Medicine

## 2024-04-19 ENCOUNTER — Other Ambulatory Visit: Payer: Self-pay | Admitting: Psychiatry

## 2024-04-21 ENCOUNTER — Ambulatory Visit (INDEPENDENT_AMBULATORY_CARE_PROVIDER_SITE_OTHER): Admitting: Professional Counselor

## 2024-04-21 DIAGNOSIS — F411 Generalized anxiety disorder: Secondary | ICD-10-CM

## 2024-04-21 DIAGNOSIS — F4312 Post-traumatic stress disorder, chronic: Secondary | ICD-10-CM | POA: Diagnosis not present

## 2024-04-21 DIAGNOSIS — F4321 Adjustment disorder with depressed mood: Secondary | ICD-10-CM

## 2024-04-21 DIAGNOSIS — F331 Major depressive disorder, recurrent, moderate: Secondary | ICD-10-CM | POA: Diagnosis not present
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
## 2024-04-30 DIAGNOSIS — E213 Hyperparathyroidism, unspecified: Secondary | ICD-10-CM | POA: Diagnosis not present

## 2024-04-30 DIAGNOSIS — E039 Hypothyroidism, unspecified: Secondary | ICD-10-CM | POA: Diagnosis not present

## 2024-04-30 DIAGNOSIS — E559 Vitamin D deficiency, unspecified: Secondary | ICD-10-CM | POA: Diagnosis not present

## 2024-04-30 DIAGNOSIS — J4540 Moderate persistent asthma, uncomplicated: Secondary | ICD-10-CM | POA: Diagnosis not present

## 2024-04-30 NOTE — Progress Notes (Signed)
 HPI Heather Jefferson is a 53 y.o. female  who presents for follow up on hypothyroidism and hyperparathyroidism. She was last seen by Dr. Dorothyann Corin on 04/29/2023. This is my first time seeing her.   Hypothyroidism was diagnosed 1-2 yrs ago. She took levothyroxine  for a year, then combo therapy with liothyronine, then monotherapy with liothyronine (unclear why). She was then advised to switch back to monotherapy levothyroxine  in 04/2023 and her TSH has been in normal range and stable since this time. She confirms she is currently taking levothyroxine  50 mcg QD. Denies missing/skipping any doses. Energy is good. Denies any symptoms of hypothyroidism. No periods. Uses Mirena IUD.   Hyperparathyroidism. She has h/o intermittent elevated PTH levels with concomitant normal serum calcium  levels. She does have h/o vitamin D  deficiency and was on a prescription for some time, but has not taken this since running out a couple months ago. Labs from 04/2023 showed only mild vitamin D  deficiency with normal PTH and calcium  levels, however labs from 01/2024 showed elevated PTH, normal calcium  and no vitamin D  level at that time. She denies any h/o nephrolithiasis, renal insufficiency, fractures, osteoporosis, or family history of calcium  or bone disorders. No DEXA in her records.    Past Medical History:  Diagnosis Date  . ADD (attention deficit disorder) without hyperactivity   . Anemia   . Anxiety and depression   . Asthma, unspecified asthma severity, unspecified whether complicated, unspecified whether persistent (HHS-HCC)   . Bruxism   . Fatigue   . Fibromyalgia   . GERD (gastroesophageal reflux disease)   . Herpes zoster   . Hyperlipidemia   . Hypertension   . Insomnia   . Malaise   . Migraine headache   . Obesity   . Raynaud's phenomenon   . Rhinitis   . Seizures (CMS/HHS-HCC)   . Sleep apnea   . Thyroid  disease   . Vitamin E deficiency    Past Surgical History:  Procedure Laterality Date   . CORNEAL EYE SURGERY Right 12/20/2015   DMEK KERATOPLASTY OD by Dr. Jerel Cramp   . COLONOSCOPY    . LAPAROSCOPIC TUBAL LIGATION     Family History  Problem Relation Name Age of Onset  . Headaches Mother Fidela Monte   . Coronary Artery Disease (Blocked arteries around heart) Mother Fidela Monte   . High blood pressure (Hypertension) Mother Fidela Monte   . Stroke Mother Fidela Monte   . Asthma Mother Fidela Monte   . Diabetes Father Unknown   . High blood pressure (Hypertension) Father Unknown   . Coronary Artery Disease (Blocked arteries around heart) Father Unknown   . High blood pressure (Hypertension) Sister Allena Meres   . Migraines Brother Antione Monte   . High blood pressure (Hypertension) Brother Guadelupe Monte   . ADD / ADHD Daughter    . ADD / ADHD Son    . Coronary Artery Disease (Blocked arteries around heart) Maternal Grandmother [name] Monte   . Diabetes Maternal Grandmother [name] Monte   . High blood pressure (Hypertension) Maternal Grandmother [name] Monte   . Tremor Maternal Grandmother [name] Monte   . Coronary Artery Disease (Blocked arteries around heart) Maternal Grandfather Unknown   . Diabetes Maternal Grandfather Unknown   . High blood pressure (Hypertension) Maternal Grandfather Unknown   . Anesthesia problems Neg Hx     Social History   Socioeconomic History  . Marital status: Married  Occupational History  . Occupation: Disability  Tobacco Use  . Smoking status:  Never    Passive exposure: Never  . Smokeless tobacco: Never  Vaping Use  . Vaping status: Never Used  Substance and Sexual Activity  . Alcohol use: Not Currently    Comment: rare, on holidays  . Drug use: No  . Sexual activity: Yes    Partners: Male    Birth control/protection: I.U.D.  Social History Narrative   Education:HS   Occupation: Associate Professor   Hobbies:    Marital Status: married   Social Drivers of Corporate investment banker Strain: Low Risk   (04/14/2024)   Received from [facility]   Overall Financial Resource Strain (CARDIA)   . How hard is it for you to pay for the very basics like food, housing, medical care, and heating?: Not hard at all  Food Insecurity: No Food Insecurity (04/14/2024)   Received from [facility]   Hunger Vital Sign   . Within the past 12 months, you worried that your food would run out before you got the money to buy more.: Never true   . Within the past 12 months, the food you bought just didn't last and you didn't have money to get more.: Never true  Transportation Needs: No Transportation Needs (04/14/2024)   Received from [facility] - Transportation   . In the past 12 months, has lack of transportation kept you from medical appointments or from getting medications?: No   . In the past 12 months, has lack of transportation kept you from meetings, work, or from getting things needed for daily living?: No  Physical Activity: Inactive (04/14/2024)   Received from [facility]   Exercise Vital Sign   . On average, how many days per week do you engage in moderate to strenuous exercise (like a brisk walk)?: 0 days  Stress: Stress Concern Present (04/14/2024)   Received from [facility] - Occupational Stress Questionnaire   . Do you feel stress - tense, restless, nervous, or anxious, or unable to sleep at night because your mind is troubled all the time - these days?: Rather much  Social Connections: Unknown (04/14/2024)   Received from [facility]   Social Connection and Isolation Panel   . In a typical week, how many times do you talk on the phone with family, friends, or neighbors?: Once a week   . How often do you get together with friends or relatives?: Patient declined   . How often do you attend church or religious services?: More than 4 times per year   . Do you belong to any clubs or organizations such as church groups, unions, fraternal or athletic  groups, or school groups?: Patient declined   . Are you married, widowed, divorced, separated, never married, or living with a partner?: Widowed  Housing Stability: Low Risk  (10/29/2023)   Received from [facility] Stability Vital Sign   . Unable to Pay for Housing in the Last Year: No   . Number of Times Moved in the Last Year: 0   . Homeless in the Last Year: No   Outpatient Medications Marked as Taking for the 04/30/24 encounter (Office Visit) with Brendia Calton Squires, PA  Medication Sig Dispense Refill  . albuterol 90 mcg/actuation inhaler Inhale 2 inhalations into the lungs every 6 (six) hours as needed for Wheezing for up to 90 days 3 each 2  . amLODIPine (NORVASC) 5 MG tablet Take 5 mg by mouth  once daily    . buPROPion  (WELLBUTRIN  XL) 150 MG XL tablet Take 150 mg by mouth once daily    . desvenlafaxine  succinate (PRISTIQ ) 100 MG ER tablet Take 100 mg by mouth once daily    . dupilumab (DUPIXENT) 300 mg/2 mL pen injector Inject 2 mLs (300 mg total) subcutaneously every 14 (fourteen) days 4 mL 11  . fluticasone  propion-salmeteroL (ADVAIR HFA) 230-21 mcg/actuation inhaler Inhale 2 inhalations into the lungs every 12 (twelve) hours 36 g 2  . hydroxychloroquine (PLAQUENIL) 200 mg tablet Take 1 tablet (200 mg total) by mouth 2 (two) times daily 180 tablet 1  . levothyroxine  (SYNTHROID ) 50 MCG tablet TAKE 1 TABLET BY MOUTH ONCE DAILY ON AN EMPTY STOMACH WITH A GLASS OF WATER AT LEAST 30-60 MINUTES BEFORE BREAKFAST 90 tablet 1  . lurasidone  (LATUDA ) 20 mg tablet Take 20 mg by mouth once daily    . MIRENA 20 mcg/24 hr (5 years) IUD Insert into the uterus    . pantoprazole (PROTONIX) 40 MG DR tablet TAKE 1 TABLET BY MOUTH TWICE DAILY BEFORE MEAL(S) 180 tablet 0  . potassium chloride  (KLOR-CON ) 8 MEQ ER tablet Take 8 mEq by mouth once daily    . prazosin  (MINIPRESS ) 2 MG capsule Take 2 mg by mouth at bedtime    . prednisoLONE acetate (PRED FORTE) 1 % ophthalmic suspension  INSTILL 1 DROP INTO RIGHT EYE ONCE DAILY 5 mL 0  . pregabalin  (LYRICA ) 50 MG capsule Take 100 mg by mouth 2 (two) times daily    . SUMAtriptan  (IMITREX ) 20 mg/actuation nasal spray as needed.      . temazepam  (RESTORIL ) 15 mg capsule Take 30 mg by mouth at bedtime    . topiramate  (TOPAMAX ) 25 MG tablet Take 25 mg by mouth once daily      Allergies  Allergen Reactions  . Augmentin [Amoxicillin-Pot Clavulanate] Hives  . Losartan Cough  . Metformin  Diarrhea, Nausea And Vomiting and Other (See Comments)    Objective BP 128/68   Pulse 74   Ht 157.5 cm (5' 2)   Wt (!) 133.4 kg (294 lb)   SpO2 92%   BMI 53.77 kg/m  GEN: well developed, obese female in NAD. HEENT: No proptosis. No lid lag or stare.   NEURO: PERRL, EOMI. PSYC: alert and oriented, good insight.  Labs 07/10/2018: K/Cr/Ca = 3.3/0.8/10.0, 25-OH vit D = 33.1 05/23/2021: PTH = 141, 25-OH vit D = 27, TSH = 2.96 11/29/2021: Cal = 9.7, TSH = 2.80 12/18/2022: PTH = 53, Cal = 10.1, albumin 4.5, alk phos 126. TSH = 6.0, 25-OH vit D = 25.9, ionized calcium  5.0  01/28/2023: PTH = 127, 25-hydroxy vitamin D  37 04/29/2023: PTH = 59, K/Cr/Ca = 4.3/0.9/9.8, 25-OH vit D = 24.4, TSH = 3.962 07/01/2023: TSH = 2.858 01/27/2024: PTH = 114, K/Cr/Ca = 3.8/0.88/9.2   Assessment  1. Acquired hypothyroidism   2. Hyperparathyroidism (CMS/HHS-HCC)   3. Vitamin D  deficiency     Plan - Will recheck TSH today to assess current thyroid  function. Will adjust dose of levothyroxine  if needed with the goal to achieve euthyroidism. For now, continue levothyroxine  50 mcg QD. Reminded of importance of compliance with levothyroxine . Discussed importance of taking levothyroxine  daily first thing in the morning on an empty stomach and waiting 30-60 min before eating or drinking other than water. Will send in refills after review of results. - Discussed hyperparathyroidism today. She has remote history of vitamin D  deficiency (currently not being treated  or monitored).  PTH has been intermittently elevated and serum calcium  has been normal. Likely she has secondary hyperparathyroidism due to vitamin D  deficiency. Will check labs today to assess for this. Will send in recommendations for vitamin D  supplementation after review of results and plan to correct this before pursuing any additional imaging for evaluation of possible primary hyperparathyroidism. - Follow up TBD after review of above results.    Attestation Statement:   I personally performed the service, non-incident to. (WP)   CASSANDRA BUEL COHN, PA
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
## 2024-05-06 ENCOUNTER — Encounter: Payer: Self-pay | Admitting: Internal Medicine

## 2024-05-07 ENCOUNTER — Encounter: Payer: Self-pay | Admitting: Psychiatry

## 2024-05-07 ENCOUNTER — Ambulatory Visit (INDEPENDENT_AMBULATORY_CARE_PROVIDER_SITE_OTHER): Admitting: Psychiatry

## 2024-05-07 ENCOUNTER — Other Ambulatory Visit: Payer: Self-pay

## 2024-05-07 VITALS — BP 141/94 | HR 80 | Temp 97.6°F | Ht 62.0 in | Wt 292.2 lb

## 2024-05-07 DIAGNOSIS — F4312 Post-traumatic stress disorder, chronic: Secondary | ICD-10-CM

## 2024-05-07 DIAGNOSIS — F331 Major depressive disorder, recurrent, moderate: Secondary | ICD-10-CM | POA: Diagnosis not present

## 2024-05-07 DIAGNOSIS — R632 Polyphagia: Secondary | ICD-10-CM

## 2024-05-07 DIAGNOSIS — G4700 Insomnia, unspecified: Secondary | ICD-10-CM | POA: Diagnosis not present

## 2024-05-07 MED ORDER — TEMAZEPAM 30 MG PO CAPS: 30.0000 mg | ORAL_CAPSULE | Freq: Every evening | ORAL | 1 refills | Status: DC | PRN

## 2024-05-07 MED ORDER — TOPIRAMATE 50 MG PO TABS: 50.0000 mg | ORAL_TABLET | Freq: Every day | ORAL | 0 refills | Status: DC

## 2024-05-07 NOTE — Patient Instructions (Addendum)
 Continue Pristiq  100 mg daily Continue bupropion  150 mg daily Continue latuda  20 mg daily   Continue topiramate  50 mg daily  Increase prazosin  6 mg at night  Continue temazepam  30 mg at night as needed for insomnia  Next appointment-  9/29 at 9 30

## 2024-05-12 ENCOUNTER — Ambulatory Visit (INDEPENDENT_AMBULATORY_CARE_PROVIDER_SITE_OTHER): Admitting: Professional Counselor

## 2024-05-12 DIAGNOSIS — F4312 Post-traumatic stress disorder, chronic: Secondary | ICD-10-CM

## 2024-05-12 DIAGNOSIS — F4321 Adjustment disorder with depressed mood: Secondary | ICD-10-CM

## 2024-05-12 DIAGNOSIS — F331 Major depressive disorder, recurrent, moderate: Secondary | ICD-10-CM

## 2024-05-12 NOTE — Progress Notes (Signed)
  THERAPIST PROGRESS NOTE  Session Time: 8:01 AM - 8:54 AM   Participation Level: Active  Behavioral Response: Casual, Alert, Euthymic  Type of Therapy: Individual Therapy  Treatment Goals addressed:  Active OP Depression  LTG: I just want to be happy again. I'm just tired of being sad and unfulfilled. Tired of beating myself up over the past.                Start:  11/12/23    Expected End:  11/11/24      STG: Dealing with my grief. To move through the stages of grief AEB processing thoughts and feelings in a balanced/healthy way and reporting improvement in mood and daily functioning over the next 12 weeks.    STG: I guess it would be better if I didn't feel so anxious when they (children) were away from me. To reduce symptoms of anxiety AEB utilizing coping mechanisms and restructuring maladaptive patterns of thinking over the next 12 weeks.  ProgressTowards Goals: Progressing  Interventions: Motivational Interviewing, Conservator, museum/gallery, and Supportive  Summary: Heather Jefferson is a 53 y.o. female who presents with a history of anxiety, depression, bipolar disorder, and PTSD. She appeared alert and oriented x5. Heather Jefferson reported she was able to find her daughter that was adopted years ago. She shared the events leading up to this and upcoming plans. She processed thoughts/feelings surrounding events. Heather Jefferson expressed frustration with her sister's reaction to things. She was receptive to being assertive and shared plans on what she wants to say to her sister.   Therapist Response: Conducted session with Heather Jefferson. Began session with check-in/update since previous session. Utilized empathetic and reflective listening. Used open-ended questions to facilitate discussion and summarized Heather Jefferson's thoughts/feelings. Explored ways to respond to sister and communicate her wants/needs from the situation. Scheduled additional appointment and concluded session.   Suicidal/Homicidal:  No  Plan: Return again in 3 weeks.  Diagnosis: MDD (major depressive disorder), recurrent episode, moderate (HCC)  Chronic post-traumatic stress disorder (PTSD)  Grief  Collaboration of Care: Medication Management AEB chart review  Patient/Guardian was advised Release of Information must be obtained prior to any record release in order to collaborate their care with an outside provider. Patient/Guardian was advised if they have not already done so to contact the registration department to sign all necessary forms in order for us  to release information regarding their care.   Consent: Patient/Guardian gives verbal consent for treatment and assignment of benefits for services provided during this visit. Patient/Guardian expressed understanding and agreed to proceed.   Heather Jefferson, [facility] 05/12/2024

## 2024-05-13 DIAGNOSIS — H10501 Unspecified blepharoconjunctivitis, right eye: Secondary | ICD-10-CM | POA: Diagnosis not present

## 2024-05-18 DIAGNOSIS — G4733 Obstructive sleep apnea (adult) (pediatric): Secondary | ICD-10-CM | POA: Diagnosis not present

## 2024-05-22 ENCOUNTER — Telehealth: Payer: Self-pay

## 2024-05-22 NOTE — Telephone Encounter (Signed)
 called pt notified that i called phamracy and they will have rx ready around 12:30

## 2024-05-22 NOTE — Telephone Encounter (Signed)
 called pharmacy they will fill have have ready by 12:30

## 2024-05-27 ENCOUNTER — Telehealth: Payer: Self-pay

## 2024-05-27 ENCOUNTER — Ambulatory Visit: Payer: Self-pay

## 2024-05-27 NOTE — Telephone Encounter (Signed)
 Thank you so much for your update. Will alert the front desk to see if she can come in.   Heather Jefferson or Heather Jefferson- could you contact her to see if she can come in tomorrow in available slot for in person visit? If it does not work, please place her on the waiting list. Thanks.
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
## 2024-05-27 NOTE — Telephone Encounter (Signed)
 Copied from CRM [id number]. Topic: Clinical - Medical Advice >> May 27, 2024 11:04 AM Tiffini S wrote: Reason for CRM: Carly with Natalie [phone] did a visit today PHQ9 for depression- scored very high as a 17/ no suicide- medication have been adjusted/ please follow up with patient in November or contact sooner if needed.

## 2024-05-27 NOTE — Telephone Encounter (Signed)
 FYI Only or Action Required?: FYI only for provider.  Patient was last seen in primary care on 04/15/2024 by Glenard Mire, MD.  Called Nurse Triage reporting Cough.  Symptoms began several days ago.  Interventions attempted: Rest, hydration, or home remedies.  Symptoms are: unchanged.  Triage Disposition: See Physician Within 24 Hours  Patient/caregiver understands and will follow disposition?: Yes  Copied from CRM #[id number]. Topic: Clinical - Red Word Triage >> May 27, 2024  3:03 PM [name] L wrote: Red Word that prompted transfer to Nurse Triage: really bad cough, chest pain with cough & pain in head Reason for Disposition  SEVERE coughing spells (e.g., whooping sound after coughing, vomiting after coughing)  Answer Assessment - Initial Assessment Questions 1. ONSET: When did the cough begin?      Started over the weekend. Patient does have a chronic cough but patient states this cough is currently worse.  2. SEVERITY: How bad is the cough today?      Moderate-severe 3. SPUTUM: Describe the color of your sputum (e.g., none, dry cough; clear, white, yellow, green)     Patient hasn't seen what she is coughing up 4. HEMOPTYSIS: Are you coughing up any blood? If Yes, ask: How much? (e.g., flecks, streaks, tablespoons, etc.)     no 5. DIFFICULTY BREATHING: Are you having difficulty breathing? If Yes, ask: How bad is it? (e.g., mild, moderate, severe)      no 6. FEVER: Do you have a fever? If Yes, ask: What is your temperature, how was it measured, and when did it start?     no 7. CARDIAC HISTORY: Do you have any history of heart disease? (e.g., heart attack, congestive heart failure)      HTN 8. LUNG HISTORY: Do you have any history of lung disease?  (e.g., pulmonary embolus, asthma, emphysema)     asthma 9. PE RISK FACTORS: Do you have a history of blood clots? (or: recent major surgery, recent prolonged travel, bedridden)     no 10. OTHER SYMPTOMS: Do  you have any other symptoms? (e.g., runny nose, wheezing, chest pain)       Congestion, headache, watery eyes.  12. TRAVEL: Have you traveled out of the country in the last month? (e.g., travel history, exposures)       no  Protocols used: Cough - Acute Productive-A-AH

## 2024-05-28 ENCOUNTER — Other Ambulatory Visit: Payer: Self-pay

## 2024-05-28 ENCOUNTER — Encounter: Payer: Self-pay | Admitting: Internal Medicine

## 2024-05-28 ENCOUNTER — Encounter: Payer: Self-pay | Admitting: Psychiatry

## 2024-05-28 ENCOUNTER — Ambulatory Visit (INDEPENDENT_AMBULATORY_CARE_PROVIDER_SITE_OTHER): Admitting: Psychiatry

## 2024-05-28 ENCOUNTER — Ambulatory Visit (INDEPENDENT_AMBULATORY_CARE_PROVIDER_SITE_OTHER): Admitting: Internal Medicine

## 2024-05-28 ENCOUNTER — Telehealth: Payer: Self-pay

## 2024-05-28 VITALS — BP 128/82 | HR 83 | Temp 98.5°F | Resp 16 | Ht 62.0 in | Wt 290.8 lb

## 2024-05-28 VITALS — BP 147/97 | HR 76 | Temp 96.7°F | Ht 62.0 in | Wt 291.8 lb

## 2024-05-28 DIAGNOSIS — R632 Polyphagia: Secondary | ICD-10-CM | POA: Diagnosis not present

## 2024-05-28 DIAGNOSIS — F331 Major depressive disorder, recurrent, moderate: Secondary | ICD-10-CM

## 2024-05-28 DIAGNOSIS — R051 Acute cough: Secondary | ICD-10-CM | POA: Diagnosis not present

## 2024-05-28 DIAGNOSIS — J069 Acute upper respiratory infection, unspecified: Secondary | ICD-10-CM

## 2024-05-28 DIAGNOSIS — F4312 Post-traumatic stress disorder, chronic: Secondary | ICD-10-CM | POA: Diagnosis not present

## 2024-05-28 DIAGNOSIS — G4700 Insomnia, unspecified: Secondary | ICD-10-CM

## 2024-05-28 DIAGNOSIS — Z79899 Other long term (current) drug therapy: Secondary | ICD-10-CM

## 2024-05-28 MED ORDER — METHYLPREDNISOLONE 4 MG PO TBPK: ORAL_TABLET | ORAL | 0 refills | Status: DC

## 2024-05-28 MED ORDER — DESVENLAFAXINE SUCCINATE ER 25 MG PO TB24: ORAL_TABLET | ORAL | 0 refills | Status: DC

## 2024-05-28 MED ORDER — HYDROCOD POLI-CHLORPHE POLI ER 10-8 MG/5ML PO SUER: 5.0000 mL | Freq: Two times a day (BID) | ORAL | 0 refills | Status: AC | PRN

## 2024-05-28 MED ORDER — VILAZODONE HCL 10 MG PO TABS: ORAL_TABLET | ORAL | 0 refills | Status: DC

## 2024-05-28 NOTE — Telephone Encounter (Signed)
 received fax from [email or web address] that a prior auth needed for desvenlafaxine  succinate er 25mg .

## 2024-05-28 NOTE — Telephone Encounter (Signed)
 received notice that prior auth was approved until 09-23-24. pa- [id number]

## 2024-05-28 NOTE — Telephone Encounter (Signed)
 went online to [email or web address] and submitted the prior auth . - pending

## 2024-05-28 NOTE — Patient Instructions (Signed)
 Decrease Pristiq  75 mg daily for one week, then 50 mg daily for one week, then 25 mg daily for one week, then discotntinue  Start Viibryd  5 mg daily (along with Pristiq  50 mg) for one week, then 10 mg daily (along with Pristiq  25 mg) Continue bupropion  150 mg daily Continue latuda  20 mg daily   Continue topiramate  50 mg daily  Continue prazosin  6 mg at night  Continue temazepam  30 mg at night as needed for insomnia  Next appointment-  9/29 at 9 30

## 2024-05-28 NOTE — Progress Notes (Signed)
 Acute Office Visit  Subjective:     Patient ID: Heather Jefferson, female    DOB: 11/02/1970, 53 y.o.   MRN: [id number]  Chief Complaint  Patient presents with   Cough    For 5 days, covid test negative    Cough Associated symptoms include a fever. Pertinent negatives include no chills, ear pain, sore throat, shortness of breath or wheezing.   Patient is in today for cough x 5 days. She is a patient of Dr. Glenard and this is my first time meeting her. Home COVID test has been negative.   Discussed the use of AI scribe software for clinical note transcription with the patient, who gave verbal consent to proceed.  History of Present Illness Heather Jefferson is a 53 year old female with asthma who presents with cough and fever.  She has a deep cough for the last couple of days, distinct from her usual asthma-related cough, causing significant chest pain and producing a small amount of yellowish sputum. She experienced a fever of 102F yesterday and currently has significant joint pain and body aches. There is no shortness of breath or increased wheezing beyond her normal asthma symptoms, though she used her albuterol inhaler more frequently over the weekend. She is due for her Dupixent injection today and has been taking Tessalon  Perles for the cough, along with Advair and albuterol inhalers for asthma. She has a slight runny nose but no sore throat, ear pain, or sinus pain. Her 74 year old son was recently sick.    Review of Systems  Constitutional:  Positive for fever. Negative for chills.  HENT:  Negative for congestion, ear pain, sinus pain and sore throat.   Respiratory:  Positive for cough and sputum production. Negative for shortness of breath and wheezing.         Objective:    BP 128/82 (Cuff Size: Large)   Pulse 83   Temp 98.5 F (36.9 C) (Oral)   Resp 16   Ht 5' 2 (1.575 m)   Wt 290 lb 12.8 oz (131.9 kg)   SpO2 97%   BMI 53.19 kg/m  BP Readings from Last  3 Encounters:  05/28/24 128/82  04/15/24 126/72  02/04/24 136/80   Wt Readings from Last 3 Encounters:  05/28/24 290 lb 12.8 oz (131.9 kg)  04/15/24 290 lb 1.6 oz (131.6 kg)  02/04/24 279 lb 11.2 oz (126.9 kg)      Physical Exam Constitutional:      Appearance: Normal appearance.  HENT:     Head: Normocephalic and atraumatic.     Right Ear: Tympanic membrane, ear canal and external ear normal.     Left Ear: Tympanic membrane, ear canal and external ear normal.     Nose: Rhinorrhea present.     Mouth/Throat:     Mouth: Mucous membranes are moist.     Pharynx: Oropharynx is clear.  Eyes:     Extraocular Movements: Extraocular movements intact.     Conjunctiva/sclera: Conjunctivae normal.     Pupils: Pupils are equal, round, and reactive to light.  Cardiovascular:     Rate and Rhythm: Normal rate and regular rhythm.  Pulmonary:     Effort: Pulmonary effort is normal.     Breath sounds: Normal breath sounds. No wheezing, rhonchi or rales.  Skin:    General: Skin is warm and dry.  Neurological:     General: No focal deficit present.     Mental Status: She is alert. Mental status  is at baseline.  Psychiatric:        Mood and Affect: Mood normal.        Behavior: Behavior normal.     No results found for any visits on 05/28/24.      Assessment & Plan:   Assessment & Plan Acute upper respiratory infection in a patient with asthma Symptoms suggest viral upper respiratory infection. Differential includes viral versus bacterial infection. Lung exam suggests viral etiology. Dupixent may increase infection susceptibility. - Prescribed Medrol  Dosepak to reduce inflammation and prevent bacterial infection. - Prescribed Tussionex with codeine for cough suppression. -Continue Albuterol inhaler as needed and Advair daily.  - Advised to report if symptoms worsen or do not improve.  - methylPREDNISolone  (MEDROL  DOSEPAK) 4 MG TBPK tablet; Use as directed.  Dispense: 21 each;  Refill: 0 - chlorpheniramine-HYDROcodone (TUSSIONEX) 10-8 MG/5ML; Take 5 mLs by mouth every 12 (twelve) hours as needed for up to 5 days.  Dispense: 50 mL; Refill: 0   Return if symptoms worsen or fail to improve.  Sharyle Fischer, DO

## 2024-06-04 ENCOUNTER — Ambulatory Visit: Admitting: Professional Counselor

## 2024-06-04 DIAGNOSIS — F4312 Post-traumatic stress disorder, chronic: Secondary | ICD-10-CM | POA: Diagnosis not present

## 2024-06-04 DIAGNOSIS — F331 Major depressive disorder, recurrent, moderate: Secondary | ICD-10-CM | POA: Diagnosis not present

## 2024-06-04 DIAGNOSIS — F411 Generalized anxiety disorder: Secondary | ICD-10-CM | POA: Diagnosis not present

## 2024-06-04 NOTE — Progress Notes (Signed)
 THERAPIST PROGRESS NOTE  Session Time: 8:01 AM - 8:48 AM  Participation Level: Active  Behavioral Response: Well Groomed, Alert, Dysphoric  Type of Therapy: Individual Therapy  Treatment Goals addressed: Active OP Depression  LTG: I just want to be happy again. I'm just tired of being sad and unfulfilled. Tired of beating myself up over the past. (Progressing)    Start:  11/12/23    Expected End:  11/11/24    Goal Note Reviewed 06/04/24 - I think that this goal has progressed well. I think that due to some of the circumstances that have changed that this will be achieved very shortly.   STG: Dealing with my grief. To move through the stages of grief AEB processing thoughts and feelings in a balanced/healthy way and reporting improvement in mood and daily functioning over the next 12 weeks. (Completed/Met)  Goal Note Reviewed 06/04/24 - I think that my grief has gotten better. I do have moments where I'm overwhelmed but for the most part it has gotten better.   STG: I guess it would be better if I didn't feel so anxious when they (children) were away from me. To reduce symptoms of anxiety AEB utilizing coping mechanisms and restructuring maladaptive patterns of thinking over the next 12 weeks.  (Progressing)  Goal Note Reviewed 06/04/24 - I think as far as me being nervous with the children being gone, I think that has improved. It's not completely gone, but it has improved.  ProgressTowards Goals: Progressing  Interventions: Motivational Interviewing and Supportive  Summary: Heather Jefferson is a 53 y.o. female who presents with a history of anxiety, depression, bipolar disorder, and PTSD. She appeared alert and oriented x5. She reported she met with her daughter and her daughter's adoptive family. She also met her grandchildren from her daughter. She reported her sister and mother didn't join them. Heather Jefferson expressed the hurt from their lack of support and was receptive that  while she might expect that behavior from them, she is still allowed to have feelings around it. Heather Jefferson reported she has been writing and journaling in a prompt book her daughter bought her. It also brings up feelings of sadness around memories of her childhood. She has been having thoughts to write a memoir about her experience trying to find her daughter that was adopted. She noted writer's block but thinks she will continue to try and work on this. Heather Jefferson noted progress on goals and areas for continued improvement.   Therapist Response: Conducted session with Heather Jefferson. Began session with check-in/update since previous session. Utilized empathetic and reflective listening. Used open-ended questions to facilitate discussion and summarized Heather Jefferson's thoughts/feelings. Validated her emotions due to lack of support from family and lack of positive memories with them. Reviewed treatment plan with input from [facility] on current strengths, needs, and progress towards goals. Encouraged Heather Jefferson to keep writing and explained thought dumping to help rid of writer's block. Scheduled additional appointment and concluded session.   Suicidal/Homicidal: No  Plan: Return again in 3 weeks.  Diagnosis: MDD (major depressive disorder), recurrent episode, moderate (HCC)  Chronic post-traumatic stress disorder (PTSD)  Anxiety, generalized  Collaboration of Care: Medication Management AEB chart review  Patient/Guardian was advised Release of Information must be obtained prior to any record release in order to collaborate their care with an outside provider. Patient/Guardian was advised if they have not already done so to contact the registration department to sign all necessary forms in order for us  to release information regarding their care.  Consent: Patient/Guardian gives verbal consent for treatment and assignment of benefits for services provided during this visit. Patient/Guardian expressed understanding and agreed  to proceed.   Heather Jefferson, [facility] 06/04/2024

## 2024-06-05 ENCOUNTER — Encounter: Payer: Self-pay | Admitting: Internal Medicine

## 2024-06-10 ENCOUNTER — Encounter: Payer: Self-pay | Admitting: Internal Medicine

## 2024-06-10 ENCOUNTER — Ambulatory Visit (INDEPENDENT_AMBULATORY_CARE_PROVIDER_SITE_OTHER): Admitting: Family Medicine

## 2024-06-10 ENCOUNTER — Encounter: Payer: Self-pay | Admitting: Family Medicine

## 2024-06-10 VITALS — BP 128/76 | HR 89 | Resp 16 | Ht 62.0 in | Wt 292.6 lb

## 2024-06-10 DIAGNOSIS — R051 Acute cough: Secondary | ICD-10-CM

## 2024-06-10 DIAGNOSIS — J4520 Mild intermittent asthma, uncomplicated: Secondary | ICD-10-CM | POA: Diagnosis not present

## 2024-06-10 MED ORDER — HYDROCOD POLI-CHLORPHE POLI ER 10-8 MG/5ML PO SUER: 5.0000 mL | Freq: Every evening | ORAL | 0 refills | Status: DC | PRN
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
## 2024-06-16 ENCOUNTER — Telehealth: Payer: Self-pay | Admitting: Psychiatry

## 2024-06-16 ENCOUNTER — Telehealth: Payer: Self-pay

## 2024-06-16 DIAGNOSIS — F4312 Post-traumatic stress disorder, chronic: Secondary | ICD-10-CM

## 2024-06-16 MED ORDER — PRAZOSIN HCL 2 MG PO CAPS: 6.0000 mg | ORAL_CAPSULE | Freq: Every day | ORAL | 0 refills | Status: DC

## 2024-06-16 NOTE — Telephone Encounter (Signed)
 Per Review of EHR , patient's last prescription for prazosin  was sent out on 03/31/2024 for 90-day supply per Dr. Vickey.  She is not due for it at this time.  Will defer this to Dr. Vickey.

## 2024-06-16 NOTE — Telephone Encounter (Signed)
 Will have CMA contact patient to clarify dosage of Prazosin .

## 2024-06-16 NOTE — Telephone Encounter (Signed)
 I have sent a refill for prazosin  to pharmacy.

## 2024-06-16 NOTE — Telephone Encounter (Signed)
 Spoke to patient she stated that she takes Prazosin  2 mg 3 tablets at bedtime

## 2024-06-16 NOTE — Telephone Encounter (Signed)
 Patient called requesting a refill of  prazosin  (MINIPRESS ) 2 MG capsule patient stated that she is out of medication please advise  Last visit  9.4-25  Next visit 06-22-24    Preferred [facility] [address] [place]), [place] - [address] Phone: [phone]  Fax: [phone]
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
## 2024-06-16 NOTE — Addendum Note (Signed)
 Addended byBETHA COBY HEIGHT on: 06/16/2024 04:51 PM   Modules accepted: Orders

## 2024-06-22 ENCOUNTER — Other Ambulatory Visit: Payer: Self-pay

## 2024-06-22 ENCOUNTER — Encounter: Payer: Self-pay | Admitting: Psychiatry

## 2024-06-22 ENCOUNTER — Ambulatory Visit (INDEPENDENT_AMBULATORY_CARE_PROVIDER_SITE_OTHER): Admitting: Psychiatry

## 2024-06-22 VITALS — BP 141/87 | HR 82 | Temp 97.4°F | Ht 62.0 in | Wt 297.8 lb

## 2024-06-22 DIAGNOSIS — F4312 Post-traumatic stress disorder, chronic: Secondary | ICD-10-CM

## 2024-06-22 DIAGNOSIS — F331 Major depressive disorder, recurrent, moderate: Secondary | ICD-10-CM | POA: Diagnosis not present

## 2024-06-22 DIAGNOSIS — G5622 Lesion of ulnar nerve, left upper limb: Secondary | ICD-10-CM | POA: Diagnosis not present

## 2024-06-22 DIAGNOSIS — G4700 Insomnia, unspecified: Secondary | ICD-10-CM | POA: Diagnosis not present

## 2024-06-22 DIAGNOSIS — Z79899 Other long term (current) drug therapy: Secondary | ICD-10-CM

## 2024-06-22 DIAGNOSIS — R632 Polyphagia: Secondary | ICD-10-CM

## 2024-06-22 DIAGNOSIS — M0609 Rheumatoid arthritis without rheumatoid factor, multiple sites: Secondary | ICD-10-CM | POA: Diagnosis not present

## 2024-06-22 DIAGNOSIS — M797 Fibromyalgia: Secondary | ICD-10-CM | POA: Diagnosis not present

## 2024-06-22 MED ORDER — BUPROPION HCL ER (XL) 150 MG PO TB24: 150.0000 mg | ORAL_TABLET | Freq: Every morning | ORAL | 1 refills | Status: AC

## 2024-06-22 MED ORDER — VILAZODONE HCL 10 MG PO TABS: 10.0000 mg | ORAL_TABLET | Freq: Every day | ORAL | 0 refills | Status: DC

## 2024-06-22 NOTE — Patient Instructions (Signed)
 Continue Viibryd  10 mg daily  Continue bupropion  150 mg daily  Continue latuda  20 mg daily   Continue topiramate  50 mg daily  Continue prazosin  6 mg at night  Continue temazepam  30 mg at night as needed for insomnia  Next appointment-  11/24 at 11:30

## 2024-06-23 DIAGNOSIS — Z947 Corneal transplant status: Secondary | ICD-10-CM | POA: Diagnosis not present

## 2024-06-24 ENCOUNTER — Ambulatory Visit (INDEPENDENT_AMBULATORY_CARE_PROVIDER_SITE_OTHER): Admitting: Professional Counselor

## 2024-06-24 DIAGNOSIS — F4312 Post-traumatic stress disorder, chronic: Secondary | ICD-10-CM

## 2024-06-24 DIAGNOSIS — F331 Major depressive disorder, recurrent, moderate: Secondary | ICD-10-CM | POA: Diagnosis not present

## 2024-06-24 DIAGNOSIS — F411 Generalized anxiety disorder: Secondary | ICD-10-CM | POA: Diagnosis not present

## 2024-06-24 NOTE — Progress Notes (Signed)
  THERAPIST PROGRESS NOTE  Session Time: 8:00 AM - 8:53 AM  Participation Level: Active  Behavioral Response: Well Groomed, Alert, Anxious and Dysphoric  Type of Therapy: Individual Therapy  Treatment Goals addressed:  Active OP Depression  LTG: I just want to be happy again. I'm just tired of being sad and unfulfilled. Tired of beating myself up over the past. (Progressing)                Start:  11/12/23    Expected End:  11/11/24    Goal Note Reviewed 06/04/24 - I think that this goal has progressed well. I think that due to some of the circumstances that have changed that this will be achieved very shortly.    STG: Dealing with my grief. To move through the stages of grief AEB processing thoughts and feelings in a balanced/healthy way and reporting improvement in mood and daily functioning over the next 12 weeks. (Completed/Met)  Goal Note Reviewed 06/04/24 - I think that my grief has gotten better. I do have moments where I'm overwhelmed but for the most part it has gotten better.    STG: I guess it would be better if I didn't feel so anxious when they (children) were away from me. To reduce symptoms of anxiety AEB utilizing coping mechanisms and restructuring maladaptive patterns of thinking over the next 12 weeks.  (Progressing)  Goal Note Reviewed 06/04/24 - I think as far as me being nervous with the children being gone, I think that has improved. It's not completely gone, but it has improved.  ProgressTowards Goals: Progressing  Interventions: CBT, Motivational Interviewing, and Supportive  Summary: Heather Jefferson is a 53 y.o. female who presents with a history of anxiety, depression, and trauma. She appeared somber but oriented x5. She reported she decided to get dressed up in hopes that looking good will help her feel good. She discussed ongoing concerns with her mother and noted her plan to come to some resolution. She expressed feelings about her sister not  wanting to provide their mother care anymore. Heather Jefferson wished she could do more. She reported recent trigger for grief. Heather Jefferson watched example of exercise to help connect with her emotions and her body and talk herself through the experience. She noted it will be hard for her to do but she is willing to try.   Therapist Response: Conducted session with Heather Jefferson. Began session with check-in/update since previous session. Utilized empathetic and reflective listening. Used open-ended questions to facilitate discussion and summarized Heather Jefferson's thoughts/feelings. Encouraged follow-thru on plan to address issues with mother. Shared video of imagery/somatic/self-talk exercise to connect with body and work through emotions. Scheduled additional appointment and concluded session.   Suicidal/Homicidal: No  Plan: Return again in 3 weeks.  Diagnosis: MDD (major depressive disorder), recurrent episode, moderate (HCC)  Anxiety, generalized  Chronic post-traumatic stress disorder (PTSD)  Collaboration of Care: Medication Management AEB chart review  Patient/Guardian was advised Release of Information must be obtained prior to any record release in order to collaborate their care with an outside provider. Patient/Guardian was advised if they have not already done so to contact the registration department to sign all necessary forms in order for us  to release information regarding their care.   Consent: Patient/Guardian gives verbal consent for treatment and assignment of benefits for services provided during this visit. Patient/Guardian expressed understanding and agreed to proceed.   Heather Jefferson, [facility] 06/24/2024

## 2024-06-25 DIAGNOSIS — R053 Chronic cough: Secondary | ICD-10-CM | POA: Diagnosis not present

## 2024-06-25 DIAGNOSIS — J208 Acute bronchitis due to other specified organisms: Secondary | ICD-10-CM | POA: Diagnosis not present

## 2024-06-25 DIAGNOSIS — G4733 Obstructive sleep apnea (adult) (pediatric): Secondary | ICD-10-CM | POA: Diagnosis not present

## 2024-07-02 ENCOUNTER — Other Ambulatory Visit: Payer: Self-pay | Admitting: Psychiatry

## 2024-07-10 ENCOUNTER — Ambulatory Visit

## 2024-07-15 ENCOUNTER — Ambulatory Visit (INDEPENDENT_AMBULATORY_CARE_PROVIDER_SITE_OTHER): Admitting: Professional Counselor

## 2024-07-15 DIAGNOSIS — F411 Generalized anxiety disorder: Secondary | ICD-10-CM

## 2024-07-15 DIAGNOSIS — F331 Major depressive disorder, recurrent, moderate: Secondary | ICD-10-CM | POA: Diagnosis not present

## 2024-07-15 DIAGNOSIS — F4321 Adjustment disorder with depressed mood: Secondary | ICD-10-CM | POA: Diagnosis not present

## 2024-07-15 DIAGNOSIS — F4312 Post-traumatic stress disorder, chronic: Secondary | ICD-10-CM
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
## 2024-07-23 ENCOUNTER — Other Ambulatory Visit: Payer: Self-pay | Admitting: Psychiatry

## 2024-07-25 ENCOUNTER — Encounter: Payer: Self-pay | Admitting: Internal Medicine

## 2024-07-28 ENCOUNTER — Ambulatory Visit (INDEPENDENT_AMBULATORY_CARE_PROVIDER_SITE_OTHER): Admitting: Professional Counselor

## 2024-07-28 ENCOUNTER — Other Ambulatory Visit: Payer: Self-pay | Admitting: Family Medicine

## 2024-07-28 DIAGNOSIS — F4312 Post-traumatic stress disorder, chronic: Secondary | ICD-10-CM | POA: Diagnosis not present

## 2024-07-28 DIAGNOSIS — F4321 Adjustment disorder with depressed mood: Secondary | ICD-10-CM

## 2024-07-28 DIAGNOSIS — F411 Generalized anxiety disorder: Secondary | ICD-10-CM | POA: Diagnosis not present

## 2024-07-28 DIAGNOSIS — F331 Major depressive disorder, recurrent, moderate: Secondary | ICD-10-CM

## 2024-07-28 NOTE — Progress Notes (Signed)
  THERAPIST PROGRESS NOTE  Session Time: 9:00 AM - 9:55 AM   Participation Level: Active  Behavioral Response: Well Groomed, Alert, Dysphoric  Type of Therapy: Individual Therapy  Treatment Goals addressed:  Active OP Depression  LTG: I just want to be happy again. I'm just tired of being sad and unfulfilled. Tired of beating myself up over the past. (Progressing)                Start:  11/12/23    Expected End:  11/11/24    Goal Note Reviewed 06/04/24 - I think that this goal has progressed well. I think that due to some of the circumstances that have changed that this will be achieved very shortly.    STG: Dealing with my grief. To move through the stages of grief AEB processing thoughts and feelings in a balanced/healthy way and reporting improvement in mood and daily functioning over the next 12 weeks. (Completed/Met)  Goal Note Reviewed 06/04/24 - I think that my grief has gotten better. I do have moments where I'm overwhelmed but for the most part it has gotten better.    STG: I guess it would be better if I didn't feel so anxious when they (children) were away from me. To reduce symptoms of anxiety AEB utilizing coping mechanisms and restructuring maladaptive patterns of thinking over the next 12 weeks.  (Progressing)  Goal Note Reviewed 06/04/24 - I think as far as me being nervous with the children being gone, I think that has improved. It's not completely gone, but it has improved.  ProgressTowards Goals: Progressing  Interventions: CBT and Supportive  Summary: Heather Jefferson is a 53 y.o. female who presents with a history of anxiety, depression, grief, and trauma. She appeared somber but oriented x5. She reported she avoided the homework assignment until yesterday. She expressed feeling very tearful since then. Heather Jefferson read her impact statement. She engaged in discussion to further identify causes/impacts of trauma. She expressed understanding to psychoeducation. She  engaged in completing ABC worksheets. She expressed understanding about homework assignment. Wilbur scored 57 on PCL screening.   Therapist Response: Conducted session with [name]. Began session with check-in/update since previous session. Utilized empathetic and reflective listening. Actively listened to Heather Jefferson read impact statement. Used open-ended questions to further facilitate discussion on causes/impact of trauma. Provided psychoeducation on natural vs manufactured emotions. Explained and practiced ABC worksheets in session. Assigned for homework. Administered PCL screening. Scheduled additional appointment and concluded session.   Suicidal/Homicidal: No  Plan: Return again in 1 weeks.  Diagnosis: Chronic post-traumatic stress disorder (PTSD)  MDD (major depressive disorder), recurrent episode, moderate (HCC)  Anxiety, generalized  Grief  Collaboration of Care: Medication Management AEB chart review  Patient/Guardian was advised Release of Information must be obtained prior to any record release in order to collaborate their care with an outside provider. Patient/Guardian was advised if they have not already done so to contact the registration department to sign all necessary forms in order for us  to release information regarding their care.   Consent: Patient/Guardian gives verbal consent for treatment and assignment of benefits for services provided during this visit. Patient/Guardian expressed understanding and agreed to proceed.   Heather Jefferson, [facility] 07/28/2024

## 2024-08-05 ENCOUNTER — Encounter: Payer: Self-pay | Admitting: Family Medicine

## 2024-08-05 ENCOUNTER — Ambulatory Visit (INDEPENDENT_AMBULATORY_CARE_PROVIDER_SITE_OTHER): Admitting: Professional Counselor

## 2024-08-05 ENCOUNTER — Ambulatory Visit: Admitting: Family Medicine

## 2024-08-05 VITALS — BP 128/80 | HR 69 | Resp 16 | Ht 62.0 in | Wt 305.1 lb

## 2024-08-05 DIAGNOSIS — F331 Major depressive disorder, recurrent, moderate: Secondary | ICD-10-CM | POA: Diagnosis not present

## 2024-08-05 DIAGNOSIS — M797 Fibromyalgia: Secondary | ICD-10-CM

## 2024-08-05 DIAGNOSIS — F4312 Post-traumatic stress disorder, chronic: Secondary | ICD-10-CM | POA: Diagnosis not present

## 2024-08-05 DIAGNOSIS — M059 Rheumatoid arthritis with rheumatoid factor, unspecified: Secondary | ICD-10-CM

## 2024-08-05 DIAGNOSIS — Z23 Encounter for immunization: Secondary | ICD-10-CM

## 2024-08-05 DIAGNOSIS — F411 Generalized anxiety disorder: Secondary | ICD-10-CM

## 2024-08-05 DIAGNOSIS — F4321 Adjustment disorder with depressed mood: Secondary | ICD-10-CM

## 2024-08-05 DIAGNOSIS — J849 Interstitial pulmonary disease, unspecified: Secondary | ICD-10-CM

## 2024-08-05 DIAGNOSIS — F3130 Bipolar disorder, current episode depressed, mild or moderate severity, unspecified: Secondary | ICD-10-CM | POA: Diagnosis not present

## 2024-08-05 DIAGNOSIS — N2581 Secondary hyperparathyroidism of renal origin: Secondary | ICD-10-CM

## 2024-08-05 DIAGNOSIS — E039 Hypothyroidism, unspecified: Secondary | ICD-10-CM

## 2024-08-05 DIAGNOSIS — R7303 Prediabetes: Secondary | ICD-10-CM

## 2024-08-05 DIAGNOSIS — G43109 Migraine with aura, not intractable, without status migrainosus: Secondary | ICD-10-CM

## 2024-08-05 DIAGNOSIS — I10 Essential (primary) hypertension: Secondary | ICD-10-CM

## 2024-08-05 DIAGNOSIS — J4520 Mild intermittent asthma, uncomplicated: Secondary | ICD-10-CM

## 2024-08-05 DIAGNOSIS — Z6841 Body Mass Index (BMI) 40.0 and over, adult: Secondary | ICD-10-CM

## 2024-08-05 MED ORDER — ROSUVASTATIN CALCIUM 10 MG PO TABS: 10.0000 mg | ORAL_TABLET | Freq: Every day | ORAL | 1 refills | Status: AC

## 2024-08-05 MED ORDER — PREGABALIN 100 MG PO CAPS: 100.0000 mg | ORAL_CAPSULE | Freq: Three times a day (TID) | ORAL | 1 refills | Status: AC

## 2024-08-05 NOTE — Progress Notes (Signed)
 THERAPIST PROGRESS NOTE  Session Time: 8:00 AM - 8:45 AM   Participation Level: Active  Behavioral Response: Well Groomed, Alert, Anxious and Dysphoric  Type of Therapy: Individual Therapy  Treatment Goals addressed: Active OP Depression  LTG: I just want to be happy again. I'm just tired of being sad and unfulfilled. Tired of beating myself up over the past. (Progressing)                Start:  11/12/23    Expected End:  11/11/24    Goal Note Reviewed 06/04/24 - I think that this goal has progressed well. I think that due to some of the circumstances that have changed that this will be achieved very shortly.    STG: Dealing with my grief. To move through the stages of grief AEB processing thoughts and feelings in a balanced/healthy way and reporting improvement in mood and daily functioning over the next 12 weeks. (Completed/Met)  Goal Note Reviewed 06/04/24 - I think that my grief has gotten better. I do have moments where I'm overwhelmed but for the most part it has gotten better.    STG: I guess it would be better if I didn't feel so anxious when they (children) were away from me. To reduce symptoms of anxiety AEB utilizing coping mechanisms and restructuring maladaptive patterns of thinking over the next 12 weeks.  (Progressing)  Goal Note Reviewed 06/04/24 - I think as far as me being nervous with the children being gone, I think that has improved. It's not completely gone, but it has improved.  ProgressTowards Goals: Progressing  Interventions: CBT and Supportive  Summary: Heather Jefferson is a 53 y.o. female who presents with a history of anxiety, depression, grief, and trauma. She appeared alert and oriented x5. She shared about her oldest daughter virtually meeting her mother and her sister. Heather Jefferson shared her thoughts/feelings, especially in reaction to her sister's behavior during the video call. She was receptive to speaking up about her reaction, not to get a  certain response from her sister, but to validate the experience for herself and gain confidence in sharing her emotions. Heather Jefferson shared completed ABC worksheets. She was receptive to feedback. She expressed understanding about next homework assignment. Heather Jefferson scored 68 on PCL screening. Heather Jefferson was surprised by the increase but understands it can be normal for score to go up before coming back down.  Therapist Response: Conducted session with Heather Jefferson. Began session with check-in/update since previous session. Utilized empathetic and reflective listening. Encouraged Heather Jefferson to speak up about her thoughts/feelings more for self-validation rather than changing other's behaviors. Provided feedback on completed ABC worksheets. Assigned additional ABC worksheets for traumatic events. Administered PCL screening. Normalized increase in score due to processing and opening up about previous traumas. Confirmed next appointment and concluded session.   Suicidal/Homicidal: No  Plan: Return again in 1 week.  Diagnosis: Chronic post-traumatic stress disorder (PTSD)  MDD (major depressive disorder), recurrent episode, moderate (HCC)  Anxiety, generalized  Grief  Collaboration of Care: Medication Management AEB chart review  Patient/Guardian was advised Release of Information must be obtained prior to any record release in order to collaborate their care with an outside provider. Patient/Guardian was advised if they have not already done so to contact the registration department to sign all necessary forms in order for us  to release information regarding their care.   Consent: Patient/Guardian gives verbal consent for treatment and assignment of benefits for services provided during this visit. Patient/Guardian expressed understanding and agreed  to proceed.   Heather Jefferson, [facility] 08/05/2024
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
## 2024-08-12 ENCOUNTER — Ambulatory Visit (INDEPENDENT_AMBULATORY_CARE_PROVIDER_SITE_OTHER): Admitting: Professional Counselor

## 2024-08-12 DIAGNOSIS — F331 Major depressive disorder, recurrent, moderate: Secondary | ICD-10-CM

## 2024-08-12 DIAGNOSIS — F4312 Post-traumatic stress disorder, chronic: Secondary | ICD-10-CM | POA: Diagnosis not present

## 2024-08-12 DIAGNOSIS — F411 Generalized anxiety disorder: Secondary | ICD-10-CM | POA: Diagnosis not present

## 2024-08-12 NOTE — Progress Notes (Signed)
  THERAPIST PROGRESS NOTE  Session Time: 9:02 AM - 9:55 AM   Participation Level: Active  Behavioral Response: Well Groomed, Alert, Anxious  Type of Therapy: Individual Therapy  Treatment Goals addressed: Active OP Depression  LTG: I just want to be happy again. I'm just tired of being sad and unfulfilled. Tired of beating myself up over the past. (Progressing)                Start:  11/12/23    Expected End:  11/11/24    Goal Note Reviewed 06/04/24 - I think that this goal has progressed well. I think that due to some of the circumstances that have changed that this will be achieved very shortly.    STG: Dealing with my grief. To move through the stages of grief AEB processing thoughts and feelings in a balanced/healthy way and reporting improvement in mood and daily functioning over the next 12 weeks. (Completed/Met)  Goal Note Reviewed 06/04/24 - I think that my grief has gotten better. I do have moments where I'm overwhelmed but for the most part it has gotten better.    STG: I guess it would be better if I didn't feel so anxious when they (children) were away from me. To reduce symptoms of anxiety AEB utilizing coping mechanisms and restructuring maladaptive patterns of thinking over the next 12 weeks.  (Progressing)  Goal Note Reviewed 06/04/24 - I think as far as me being nervous with the children being gone, I think that has improved. It's not completely gone, but it has improved.  ProgressTowards Goals: Progressing  Interventions: CBT and Supportive  Summary: Heather Jefferson is a 53 y.o. female who presents with a history of anxiety, depression, and PTSD. She appeared alert and oriented x5. She shared updates over the last week, mostly in relation to her family. Heather Jefferson shared completed homework, ABC worksheets. She agreed that she avoided doing any of them on trauma. She expressed understanding about additional homework to complete ABC worksheets on trauma. Heather Jefferson  engaged in completing a challenging questions worksheet. She expressed understanding about completing these for homework as well.   Therapist Response: Conducted session with Heather Jefferson. Began session with check-in/update since previous session. Utilized empathetic and reflective listening. Used Socratic questioning to help challenge thinking and encouraged Heather Jefferson to separate thoughts from feelings. Assigned ABC worksheets again to be focused on trauma. Engaged in completing challenging questions worksheet and assigned for homework as well. Scheduled additional appointment and concluded session.   Suicidal/Homicidal: No  Plan: Return again in 1 week.  Diagnosis: Chronic post-traumatic stress disorder (PTSD)  MDD (major depressive disorder), recurrent episode, moderate (HCC)  Anxiety, generalized  Collaboration of Care: Medication Management AEB chart review  Patient/Guardian was advised Release of Information must be obtained prior to any record release in order to collaborate their care with an outside provider. Patient/Guardian was advised if they have not already done so to contact the registration department to sign all necessary forms in order for us  to release information regarding their care.   Consent: Patient/Guardian gives verbal consent for treatment and assignment of benefits for services provided during this visit. Patient/Guardian expressed understanding and agreed to proceed.   Heather Jefferson, [facility] 08/12/2024
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
## 2024-08-17 ENCOUNTER — Encounter: Payer: Self-pay | Admitting: Psychiatry

## 2024-08-17 ENCOUNTER — Ambulatory Visit (INDEPENDENT_AMBULATORY_CARE_PROVIDER_SITE_OTHER): Admitting: Psychiatry

## 2024-08-17 ENCOUNTER — Other Ambulatory Visit: Payer: Self-pay

## 2024-08-17 ENCOUNTER — Encounter: Payer: Self-pay | Admitting: Family Medicine

## 2024-08-17 VITALS — BP 136/86 | HR 74 | Temp 97.5°F | Ht 62.0 in | Wt 307.2 lb

## 2024-08-17 DIAGNOSIS — F4312 Post-traumatic stress disorder, chronic: Secondary | ICD-10-CM | POA: Diagnosis not present

## 2024-08-17 DIAGNOSIS — G4700 Insomnia, unspecified: Secondary | ICD-10-CM

## 2024-08-17 DIAGNOSIS — F331 Major depressive disorder, recurrent, moderate: Secondary | ICD-10-CM | POA: Diagnosis not present

## 2024-08-17 DIAGNOSIS — R632 Polyphagia: Secondary | ICD-10-CM | POA: Diagnosis not present

## 2024-08-17 MED ORDER — VILAZODONE HCL 10 MG PO TABS: 10.0000 mg | ORAL_TABLET | Freq: Every day | ORAL | 0 refills | Status: AC

## 2024-08-17 MED ORDER — TOPIRAMATE 50 MG PO TABS: 50.0000 mg | ORAL_TABLET | Freq: Every day | ORAL | 1 refills | Status: AC

## 2024-08-17 MED ORDER — LURASIDONE HCL 20 MG PO TABS: 20.0000 mg | ORAL_TABLET | Freq: Every day | ORAL | 0 refills | Status: AC

## 2024-08-17 MED ORDER — PRAZOSIN HCL 2 MG PO CAPS
6.0000 mg | ORAL_CAPSULE | Freq: Every day | ORAL | 1 refills | Status: AC
Start: 2024-09-14 — End: 2025-03-13

## 2024-08-17 MED ORDER — TEMAZEPAM 30 MG PO CAPS: 30.0000 mg | ORAL_CAPSULE | Freq: Every evening | ORAL | 1 refills | Status: DC | PRN

## 2024-08-17 NOTE — Patient Instructions (Signed)
 Continue Viibryd  10 mg daily  Continue bupropion  150 mg daily Continue latuda  20 mg daily   Continue topiramate  50 mg daily  Continue prazosin  6 mg at night  Continue temazepam  30 mg at night as needed for insomnia  Next appointment-  1/20 at 11:30

## 2024-08-19 ENCOUNTER — Other Ambulatory Visit: Payer: Self-pay | Admitting: Family Medicine

## 2024-08-19 ENCOUNTER — Other Ambulatory Visit (HOSPITAL_COMMUNITY): Payer: Self-pay

## 2024-08-19 ENCOUNTER — Ambulatory Visit (INDEPENDENT_AMBULATORY_CARE_PROVIDER_SITE_OTHER): Admitting: Professional Counselor

## 2024-08-19 ENCOUNTER — Encounter: Payer: Self-pay | Admitting: Family Medicine

## 2024-08-19 ENCOUNTER — Telehealth: Payer: Self-pay | Admitting: Pharmacy Technician

## 2024-08-19 DIAGNOSIS — F4312 Post-traumatic stress disorder, chronic: Secondary | ICD-10-CM | POA: Diagnosis not present

## 2024-08-19 DIAGNOSIS — G4733 Obstructive sleep apnea (adult) (pediatric): Secondary | ICD-10-CM

## 2024-08-19 MED ORDER — ZEPBOUND 2.5 MG/0.5ML ~~LOC~~ SOAJ: 2.5000 mg | SUBCUTANEOUS | 0 refills | Status: DC

## 2024-08-19 NOTE — Telephone Encounter (Signed)
 Pharmacy Patient Advocate Encounter   Received notification from CoverMyMeds that prior authorization for Zepbound  2.5MG /0.5ML pen-injectors is required/requested.   Insurance verification completed.   The patient is insured through [facility].   Per test claim: PA required; PA started via CoverMyMeds. KEY [id number] . Waiting for clinical questions to populate.

## 2024-08-19 NOTE — Telephone Encounter (Signed)
 Pharmacy Patient Advocate Encounter  Received notification from [facility] MEDICARE that Prior Authorization for Zepbound  2.5MG /0.5ML pen-injectors has been APPROVED from 08/19/24 to 09/23/25. Ran test claim, Copay is $0.00. This test claim was processed through [facility]- copay amounts may vary at other pharmacies due to pharmacy/plan contracts, or as the patient moves through the different stages of their insurance plan.     PA #/Case ID/Reference #: [id number]

## 2024-08-19 NOTE — Progress Notes (Signed)
 THERAPIST PROGRESS NOTE  Session Time: 8:00 AM - 8:57 AM  Participation Level: Active  Behavioral Response: Well Groomed, Alert, Anxious and Dysphoric  Type of Therapy: Individual Therapy  Treatment Goals addressed: Active OP Depression  LTG: I just want to be happy again. I'm just tired of being sad and unfulfilled. Tired of beating myself up over the past. (Progressing)                Start:  11/12/23    Expected End:  11/11/24    Goal Note Reviewed 06/04/24 - I think that this goal has progressed well. I think that due to some of the circumstances that have changed that this will be achieved very shortly.    STG: Dealing with my grief. To move through the stages of grief AEB processing thoughts and feelings in a balanced/healthy way and reporting improvement in mood and daily functioning over the next 12 weeks. (Completed/Met)  Goal Note Reviewed 06/04/24 - I think that my grief has gotten better. I do have moments where I'm overwhelmed but for the most part it has gotten better.    STG: I guess it would be better if I didn't feel so anxious when they (children) were away from me. To reduce symptoms of anxiety AEB utilizing coping mechanisms and restructuring maladaptive patterns of thinking over the next 12 weeks.  (Progressing)  Goal Note Reviewed 06/04/24 - I think as far as me being nervous with the children being gone, I think that has improved. It's not completely gone, but it has improved.  ProgressTowards Goals: Progressing  Interventions: CBT and Supportive  Summary: Heather Jefferson is a 53 y.o. female who presents with a history of anxiety, depression, and PTSD. She appeared alert and oriented x5. She reported the dinner went well with her sister and children. She noted her mother did not attend. Heather Jefferson reported the holidays have increased feelings of grief around her husband's loss. She also reported her daughter's 18th birthday is right around his anniversary  date. She expressed a desire to ignore these feelings, but was open to offering space for her daughter to grieve, while also celebrating her birthday. Heather Jefferson stated the homework was very hard for her but she did complete it. She shared ABC worksheets directly related to trauma. She was receptive to feedback. She also shared challenging questions worksheets, again open to feedback. Heather Jefferson expressed understanding about problematic patterns of thinking worksheets. She was in agreement to complete for homework. Heather Jefferson scored 74 on PCL screening. She was receptive to practicing more grounding/coping skills.   Therapist Response: Conducted session with Heather Jefferson. Began session with check-in/update since previous session. Utilized empathetic and reflective listening. Used open-ended questions to facilitate discussion and summarized Heather Jefferson's thoughts/feelings. Highlighted Heather Jefferson's pattern of avoidance with grief as well as avoidance from trauma work. Provided positive reinforcement for completing all the homework this week. Reviewed ABC worksheets around trauma. Held space for grounding techniques. Reviewed challenging questions worksheets. Explained problematic patterns of thinking worksheet. Administered PCL screening. Scheduled additional appointment and concluded session.   Suicidal/Homicidal: No  Plan: Return again in 1 week.  Diagnosis: Chronic post-traumatic stress disorder (PTSD)  Collaboration of Care: Medication Management AEB chart review  Patient/Guardian was advised Release of Information must be obtained prior to any record release in order to collaborate their care with an outside provider. Patient/Guardian was advised if they have not already done so to contact the registration department to sign all necessary forms in order for us  to  release information regarding their care.   Consent: Patient/Guardian gives verbal consent for treatment and assignment of benefits for services provided during this  visit. Patient/Guardian expressed understanding and agreed to proceed.   Heather Jefferson, [facility] 08/19/2024

## 2024-08-26 ENCOUNTER — Ambulatory Visit (INDEPENDENT_AMBULATORY_CARE_PROVIDER_SITE_OTHER): Admitting: Professional Counselor

## 2024-08-26 DIAGNOSIS — F4312 Post-traumatic stress disorder, chronic: Secondary | ICD-10-CM | POA: Diagnosis not present

## 2024-08-26 NOTE — Progress Notes (Signed)
  THERAPIST PROGRESS NOTE  Session Time: 8:00 AM - 8:53 PM   Participation Level: Active  Behavioral Response: Casual, Alert, Anxious and Dysphoric  Type of Therapy: Individual Therapy  Treatment Goals addressed: Active OP Depression  LTG: I just want to be happy again. I'm just tired of being sad and unfulfilled. Tired of beating myself up over the past. (Progressing)                Start:  11/12/23    Expected End:  11/11/24    Goal Note Reviewed 06/04/24 - I think that this goal has progressed well. I think that due to some of the circumstances that have changed that this will be achieved very shortly.    STG: Dealing with my grief. To move through the stages of grief AEB processing thoughts and feelings in a balanced/healthy way and reporting improvement in mood and daily functioning over the next 12 weeks. (Completed/Met)  Goal Note Reviewed 06/04/24 - I think that my grief has gotten better. I do have moments where I'm overwhelmed but for the most part it has gotten better.    STG: I guess it would be better if I didn't feel so anxious when they (children) were away from me. To reduce symptoms of anxiety AEB utilizing coping mechanisms and restructuring maladaptive patterns of thinking over the next 12 weeks.  (Progressing)  Goal Note Reviewed 06/04/24 - I think as far as me being nervous with the children being gone, I think that has improved. It's not completely gone, but it has improved.  ProgressTowards Goals: Progressing  Interventions: CBT and Supportive  Summary: Heather Jefferson is a 53 y.o. female who presents with a history of anxiety, depression, and PTSD. She appeared alert and oriented x5. She stated Thanksgiving went well. She celebrated with her children and one of her daughter's friends. [name] shared homework she completed - problematic patterns of thinking worksheets. She engaged in completing a challenging beliefs worksheet. She expressed understanding  about homework assignment. [name] scored 72 on PCL screening.   Therapist Response: Conducted session with [name]. Began session with check-in/update since previous session. Utilized empathetic and reflective listening. Provided feedback as Zoha shared homework assignment. Explained and engaged in completing challenging beliefs worksheet. Assigned remainder for homework. Administered PCL screening. Scheduled additional appointment and concluded session.   Suicidal/Homicidal: No  Plan: Return again in 1 week.  Diagnosis: Chronic post-traumatic stress disorder (PTSD)  Collaboration of Care: Medication Management AEB chart review  Patient/Guardian was advised Release of Information must be obtained prior to any record release in order to collaborate their care with an outside provider. Patient/Guardian was advised if they have not already done so to contact the registration department to sign all necessary forms in order for us  to release information regarding their care.   Consent: Patient/Guardian gives verbal consent for treatment and assignment of benefits for services provided during this visit. Patient/Guardian expressed understanding and agreed to proceed.   Heather Jefferson, [facility] 08/26/2024

## 2024-09-02 ENCOUNTER — Ambulatory Visit (INDEPENDENT_AMBULATORY_CARE_PROVIDER_SITE_OTHER): Admitting: Professional Counselor

## 2024-09-02 DIAGNOSIS — F4312 Post-traumatic stress disorder, chronic: Secondary | ICD-10-CM

## 2024-09-02 DIAGNOSIS — F4321 Adjustment disorder with depressed mood: Secondary | ICD-10-CM | POA: Diagnosis not present

## 2024-09-02 NOTE — Progress Notes (Signed)
 THERAPIST PROGRESS NOTE  Session Time: 8:05 AM - 8:55 AM  Participation Level: Active  Behavioral Response: Casual, Alert, Anxious and Dysphoric  Type of Therapy: Individual Therapy  Treatment Goals addressed: Active OP Depression  LTG: I just want to be happy again. I'm just tired of being sad and unfulfilled. Tired of beating myself up over the past. (Progressing)                Start:  11/12/23    Expected End:  11/11/24    Goal Note Reviewed 06/04/24 - I think that this goal has progressed well. I think that due to some of the circumstances that have changed that this will be achieved very shortly.    STG: Dealing with my grief. To move through the stages of grief AEB processing thoughts and feelings in a balanced/healthy way and reporting improvement in mood and daily functioning over the next 12 weeks. (Completed/Met)  Goal Note Reviewed 06/04/24 - I think that my grief has gotten better. I do have moments where I'm overwhelmed but for the most part it has gotten better.    STG: I guess it would be better if I didn't feel so anxious when they (children) were away from me. To reduce symptoms of anxiety AEB utilizing coping mechanisms and restructuring maladaptive patterns of thinking over the next 12 weeks.  (Progressing)  Goal Note Reviewed 06/04/24 - I think as far as me being nervous with the children being gone, I think that has improved. It's not completely gone, but it has improved.  ProgressTowards Goals: Progressing  Interventions: CBT and Supportive  Summary: AERIELLE STOKLOSA is a 53 y.o. female who presents with a history of anxiety, depression, and PTSD. She also has been struggling with grief over the last year since her husband's death. Niti appeared somber but oriented x5 today. She stated she didn't want to come or do the homework this week but she managed to do both. She reported her daughter's birthday went well until they received a condolences card  for her the anniversary of her husband's death. Cj reported she was there for her daughter and offered her comfort as she cried. She reported they both spent the anniversary date in pajamas all day. Everley shared homework she completed - challenging beliefs worksheets on traumatic events. She was receptive to feedback from conservation officer, nature. She actively listened to safety module. She did not identify safety stuck points on her log but did identify with stuck points listed on handout. She will completed challenging beliefs worksheets on those for homework.  Therapist Response: Conducted session with [name]. Began session with check-in/update since previous session. Utilized empathetic and reflective listening. Used positive reinforcement for allowing herself and her daughter to feel grief over anniversary of husband's/father's death. Provided feedback on challenging beliefs worksheets. Explained safety module and assigned homework on safety stuck points. Scheduled additional appointment and concluded session.   Suicidal/Homicidal: No  Plan: Return again in 1 week.  Diagnosis: Chronic post-traumatic stress disorder (PTSD)  Grief  Collaboration of Care: Medication Management AEB chart review  Patient/Guardian was advised Release of Information must be obtained prior to any record release in order to collaborate their care with an outside provider. Patient/Guardian was advised if they have not already done so to contact the registration department to sign all necessary forms in order for us  to release information regarding their care.   Consent: Patient/Guardian gives verbal consent for treatment and assignment of benefits for services provided during  this visit. Patient/Guardian expressed understanding and agreed to proceed.   [name], [facility] 09/02/2024

## 2024-09-07 ENCOUNTER — Encounter: Payer: Self-pay | Admitting: Internal Medicine

## 2024-09-09 ENCOUNTER — Ambulatory Visit: Admitting: Professional Counselor

## 2024-09-09 DIAGNOSIS — F4312 Post-traumatic stress disorder, chronic: Secondary | ICD-10-CM | POA: Diagnosis not present

## 2024-09-09 NOTE — Progress Notes (Unsigned)
°  THERAPIST PROGRESS NOTE  Session Time: 8:00 AM - 8:53 AM   Participation Level: Active  Behavioral Response: Well Groomed, Alert, Anxious and Dysphoric  Type of Therapy: Individual Therapy  Treatment Goals addressed: Active OP Depression  LTG: I just want to be happy again. I'm just tired of being sad and unfulfilled. Tired of beating myself up over the past. (Progressing)                Start:  11/12/23    Expected End:  11/11/24    Goal Note Reviewed 06/04/24 - I think that this goal has progressed well. I think that due to some of the circumstances that have changed that this will be achieved very shortly.    STG: Dealing with my grief. To move through the stages of grief AEB processing thoughts and feelings in a balanced/healthy way and reporting improvement in mood and daily functioning over the next 12 weeks. (Completed/Met)  Goal Note Reviewed 06/04/24 - I think that my grief has gotten better. I do have moments where I'm overwhelmed but for the most part it has gotten better.    STG: I guess it would be better if I didn't feel so anxious when they (children) were away from me. To reduce symptoms of anxiety AEB utilizing coping mechanisms and restructuring maladaptive patterns of thinking over the next 12 weeks.  (Progressing)  Goal Note Reviewed 06/04/24 - I think as far as me being nervous with the children being gone, I think that has improved. It's not completely gone, but it has improved.  ProgressTowards Goals: Progressing  Interventions: CBT and Supportive  Summary: Heather Jefferson is a 53 y.o. female who presents with a history of anxiety, depression, and PTSD. She appeared alert and oriented x5. She stated things have been going well recently. She has been planning to get together with her children, sister, and mother for Christmas. [name] shared completed homework on safety stuck points. She was receptive to feedback from conservation officer, nature. [name] expressed  understanding about trust module and homework assignment. She scored 71 on PCL screening.  Therapist Response: Conducted session with [name]. Began session with check-in/update since previous session. Utilized empathetic and reflective listening. Used Socratic questioning to help challenge stuck points. Explained trust module and trust start exercise. Administered PCL screening. Scheduled additional appointment and concluded session.   Suicidal/Homicidal: No  Plan: Return again in 2 weeks.  Diagnosis: Chronic post-traumatic stress disorder (PTSD)  Collaboration of Care: Medication Management AEB chart review  Patient/Guardian was advised Release of Information must be obtained prior to any record release in order to collaborate their care with an outside provider. Patient/Guardian was advised if they have not already done so to contact the registration department to sign all necessary forms in order for us  to release information regarding their care.   Consent: Patient/Guardian gives verbal consent for treatment and assignment of benefits for services provided during this visit. Patient/Guardian expressed understanding and agreed to proceed.   [name], [facility] 09/09/2024

## 2024-09-10 ENCOUNTER — Other Ambulatory Visit: Payer: Self-pay | Admitting: Family Medicine

## 2024-09-10 DIAGNOSIS — G4733 Obstructive sleep apnea (adult) (pediatric): Secondary | ICD-10-CM

## 2024-09-14 ENCOUNTER — Other Ambulatory Visit: Payer: Self-pay | Admitting: Family Medicine

## 2024-09-14 DIAGNOSIS — G4733 Obstructive sleep apnea (adult) (pediatric): Secondary | ICD-10-CM

## 2024-09-14 NOTE — Telephone Encounter (Signed)
 Copied from CRM #[id number]. Topic: Clinical - Medication Refill >> Sep 14, 2024  9:57 AM Delon HERO wrote: Medication: tirzepatide  (ZEPBOUND ) 2.5 MG/0.5ML Pen [[id number]]  Has the patient contacted their pharmacy? Yes (Agent: If no, request that the patient contact the pharmacy for the refill. If patient does not wish to contact the pharmacy document the reason why and proceed with request.) (Agent: If yes, when and what did the pharmacy advise?)  This is the patient's preferred pharmacy:  [facility] [address]), [place] - [address] [place]THE[place] JACO[place]) [place] [address] Phone: [phone] Fax: [phone]  Is this the correct pharmacy for this prescription? Yes If no, delete pharmacy and type the correct one.   Has the prescription been filled recently? Yes  Is the patient out of the medication? Yes  Has the patient been seen for an appointment in the last year OR does the patient have an upcoming appointment? Yes  Can we respond through MyChart? Yes  Agent: Please be advised that Rx refills may take up to 3 business days. We ask that you follow-up with your pharmacy.

## 2024-09-14 NOTE — Telephone Encounter (Signed)
 Requested medication (s) are due for refill today: No  Requested medication (s) are on the active medication list: Yes  Last refill:  08/19/24  Future visit scheduled: Yes  Notes to clinic:  Manual review.    Requested Prescriptions  Pending Prescriptions Disp Refills   ZEPBOUND  2.5 MG/0.5ML Pen [Pharmacy Med Name: Zepbound  2.5 MG/0.5ML Subcutaneous Solution Auto-injector] 4 mL 0    Sig: INJECT 2.5MG  INTO THE SKIN  ONCE A WEEK     Off-Protocol Failed - 09/14/2024 10:47 AM      Failed - Medication not assigned to a protocol, review manually.      Passed - Valid encounter within last 12 months    Recent Outpatient Visits           1 month ago Interstitial pulmonary disease [facility])   [place] [facility] Glenard Mire, MD   3 months ago Acute cough   [facility] Glenard Mire, MD   3 months ago Viral upper respiratory tract infection   [facility] Bernardo Fend, DO   5 months ago Screening examination for STI   [facility] Glenard Mire, MD   7 months ago Rheumatoid arthritis with positive rheumatoid factor, involving unspecified site [facility])   [facility] Sowles, Krichna, MD       Future Appointments             In 3 months Glenard, Krichna, MD [facility], [place]

## 2024-09-16 NOTE — Telephone Encounter (Signed)
 Duplicate request.  Requested Prescriptions  Pending Prescriptions Disp Refills   tirzepatide  (ZEPBOUND ) 2.5 MG/0.5ML Pen 4 mL 0     Off-Protocol Failed - 09/16/2024 12:01 PM      Failed - Medication not assigned to a protocol, review manually.      Passed - Valid encounter within last 12 months    Recent Outpatient Visits           1 month ago Interstitial pulmonary disease [facility])   [place] [facility] Glenard Mire, MD   3 months ago Acute cough   [facility] Glenard Mire, MD   3 months ago Viral upper respiratory tract infection   [facility] Bernardo Fend, DO   5 months ago Screening examination for STI   [facility] Glenard Mire, MD   7 months ago Rheumatoid arthritis with positive rheumatoid factor, involving unspecified site [facility])   [facility] Sowles, Krichna, MD       Future Appointments             In 3 months Glenard, Krichna, MD [facility], [place]

## 2024-09-23 ENCOUNTER — Ambulatory Visit: Admitting: Professional Counselor

## 2024-09-29 ENCOUNTER — Encounter: Payer: Self-pay | Admitting: Internal Medicine

## 2024-09-30 ENCOUNTER — Ambulatory Visit (INDEPENDENT_AMBULATORY_CARE_PROVIDER_SITE_OTHER): Admitting: Professional Counselor

## 2024-09-30 DIAGNOSIS — F4312 Post-traumatic stress disorder, chronic: Secondary | ICD-10-CM | POA: Diagnosis not present

## 2024-09-30 NOTE — Progress Notes (Signed)
" °  THERAPIST PROGRESS NOTE  Session Time: 9:03 AM - 9:57 AM   Participation Level: Active  Behavioral Response: Casual, Alert, Dysphoric  Type of Therapy: Individual Therapy  Treatment Goals addressed: Active OP Depression  LTG: I just want to be happy again. I'm just tired of being sad and unfulfilled. Tired of beating myself up over the past. (Progressing)                Start:  11/12/23    Expected End:  11/11/24    Goal Note Reviewed 06/04/24 - I think that this goal has progressed well. I think that due to some of the circumstances that have changed that this will be achieved very shortly.    STG: Dealing with my grief. To move through the stages of grief AEB processing thoughts and feelings in a balanced/healthy way and reporting improvement in mood and daily functioning over the next 12 weeks. (Completed/Met)  Goal Note Reviewed 06/04/24 - I think that my grief has gotten better. I do have moments where I'm overwhelmed but for the most part it has gotten better.    STG: I guess it would be better if I didn't feel so anxious when they (children) were away from me. To reduce symptoms of anxiety AEB utilizing coping mechanisms and restructuring maladaptive patterns of thinking over the next 12 weeks.  (Progressing)  Goal Note Reviewed 06/04/24 - I think as far as me being nervous with the children being gone, I think that has improved. It's not completely gone, but it has improved.  ProgressTowards Goals: Progressing  Interventions: CBT and Supportive  Summary: MONITA SWIER is a 54 y.o. female who presents with a history of anxiety, depression, and PTSD. She appeared somber but oriented x5. She reported she chose to stay home for Christmas and her sister is mad at her for this. They did have dinner prior to the holidays with her oldest daughter, younger children, and sister's family. Her mother again did not come. She reported her sister was already mad at her when they  had dinner, which made things awkward. Camya noted grief around the holidays as well. However, she is trying not to let her sister's feelings/behaviors bother her. She shared homework she completed on trust stuck points. She was receptive to feedback and noted how helpful these were. She expressed understanding about next homework assignment.   Therapist Response: Conducted session with [name]. Began session with check-in/update since previous session. Utilized empathetic and reflective listening. Used open-ended questions to facilitate discussion and summarized Royalti's thoughts/feelings. Provided positive reinforcement about Margrit not needing to apologize or fix her sister's emotions. Provided feedback on challenging beliefs worksheets on trust stuck points. Explained next module - assigned homework to challenge power/control stuck points. Scheduled additional appointment and concluded session.   Suicidal/Homicidal: No  Plan: Return again in 1 week.  Diagnosis: Chronic post-traumatic stress disorder (PTSD)  Collaboration of Care: Medication Management AEB chart review  Patient/Guardian was advised Release of Information must be obtained prior to any record release in order to collaborate their care with an outside provider. Patient/Guardian was advised if they have not already done so to contact the registration department to sign all necessary forms in order for us  to release information regarding their care.   Consent: Patient/Guardian gives verbal consent for treatment and assignment of benefits for services provided during this visit. Patient/Guardian expressed understanding and agreed to proceed.   [name], [facility] 09/30/2024  "

## 2024-10-01 NOTE — Procedures (Signed)
 Complete Pulmonary Function Test  Referring Physician Self  Reason for PFT  Moderate persistent asthma, unspecified whether complicated (HHS-HCC)  Chronic cough  SPIROMETRY: FVC was 2.13 L, 80 % of predicted FEV1 was 1.78 L, 83 % of predicted FEV1/FVC ratio was  104 % of predicted FEF 25-75% liters per second was 102 % of predicted  LUNG VOLUMES: TLC was 83 % of predicted RV was 117 % of predicted  DIFFUSION CAPACITY: DLCO was 130 % of predicted DLCO/VA was 165 % of predicted   Good patient effort with good repeatability.  Interpretation:   Interpreting Provider: Dr. Parris       **See physician progress note for interpretation

## 2024-10-01 NOTE — Progress Notes (Signed)
 "                                DIVISION OF PULMONARY AND CRITICAL CARE MEDICINE                              FOLLOW UP ENCOUNTER     Chief complaint: Asthma OSA and Obesity overlap syndrome  History of Present Illness Heather Jefferson is a 54 year old female with asthma who presents for a follow-up on her pulmonary function and asthma management.  Recent spirometry results are similar to those from July, showing airflow in the mid-eighties and lung volumes at eighty-three percent. Her DLCO was one hundred and thirty percent, compared to one hundred and twelve percent previously.  She previously tried Dupixent but experienced side effects, including a worsening cough, leading to its discontinuation. She was supposed to start Fasenra as an alternative, but a sleep study was required for approval. She completed the sleep study over the past weekend and is awaiting results.  She has a history of sleep apnea and obesity, for which she uses a CPAP machine with a nasal pillow interface. She reports no issues with the CPAP and notes that her weight has decreased.  She experiences wheezing when walking, such as when returning to her car from appointments. She typically leaves her albuterol inhaler at home but has it available and does not require additional refills at this time.  She is currently using Symbicort and albuterol for her asthma management and does not need refills for these medications at present.   Past Medical History:   Past Medical History:  Diagnosis Date   ADD (attention deficit disorder) without hyperactivity    Anemia    Anxiety and depression    Asthma, unspecified asthma severity, unspecified whether complicated, unspecified whether persistent (HHS-HCC)    Bruxism    Fatigue    Fibromyalgia    GERD (gastroesophageal reflux disease)    Herpes zoster    Hyperlipidemia    Hypertension    Insomnia    Malaise    Migraine headache    Obesity     Raynaud's phenomenon    Rhinitis    Seizures (CMS/HHS-HCC)    Sleep apnea    Thyroid  disease    Vitamin E deficiency     Past Surgical History:   Past Surgical History:  Procedure Laterality Date   CORNEAL EYE SURGERY Right 12/20/2015   DMEK KERATOPLASTY OD by Dr. Jerel Cramp    COLONOSCOPY     LAPAROSCOPIC TUBAL LIGATION      Allergies:   Allergies  Allergen Reactions   Augmentin [Amoxicillin-Pot Clavulanate] Hives   Losartan Cough   Metformin  Diarrhea, Nausea And Vomiting and Other (See Comments)    Current Medications:   Prior to Admission medications  Medication Sig Taking? Last Dose  albuterol 90 mcg/actuation inhaler Inhale 2 inhalations into the lungs every 6 (six) hours as needed for Wheezing for up to 90 days    albuterol MDI, PROVENTIL, VENTOLIN, PROAIR, HFA 90 mcg/actuation inhaler Inhale 2 inhalations into the lungs every 6 (six) hours as needed for Wheezing    amLODIPine (NORVASC) 5 MG tablet Take 5 mg by mouth once daily    budesonide-formoteroL (SYMBICORT) 160-4.5 mcg/actuation inhaler Inhale 2 inhalations into the lungs 2 (two) times daily    buPROPion  (WELLBUTRIN  XL) 150 MG XL tablet Take  150 mg by mouth once daily    ergocalciferol , vitamin D2, 1,250 mcg (50,000 unit) capsule Take 1 capsule (50,000 Units total) by mouth every 14 (fourteen) days    hydroxychloroquine (PLAQUENIL) 200 mg tablet Take 1 tablet (200 mg total) by mouth 2 (two) times daily    levothyroxine  (SYNTHROID ) 50 MCG tablet Take 1 tablet (50 mcg total) by mouth every morning before breakfast (0630) ON AN EMPTY STOMACH WITH A GLASS OF WATER AT LEAST 30-60 MINUTES BEFORE BREAKFAST    lurasidone  (LATUDA ) 20 mg tablet Take 20 mg by mouth once daily    MIRENA 20 mcg/24 hr (5 years) IUD Insert into the uterus    pantoprazole (PROTONIX) 40 MG DR tablet TAKE 1 TABLET BY MOUTH TWICE DAILY BEFORE MEAL(S)    potassium chloride  (KLOR-CON ) 8 MEQ ER tablet Take 8 mEq by mouth once daily     prazosin  (MINIPRESS ) 2 MG capsule Take 2 mg by mouth at bedtime    prednisoLONE acetate (PRED FORTE) 1 % ophthalmic suspension INSTILL 1 DROP INTO RIGHT EYE ONCE DAILY    pregabalin  (LYRICA ) 50 MG capsule Take 100 mg by mouth 3 (three) times daily    rosuvastatin  (CRESTOR ) 10 MG tablet Take 10 mg by mouth once daily    SUMAtriptan  (IMITREX ) 20 mg/actuation nasal spray as needed.      temazepam  (RESTORIL ) 15 mg capsule Take 30 mg by mouth at bedtime    topiramate  (TOPAMAX ) 25 MG tablet Take 25 mg by mouth once daily    vilazodone  (VIIBRYD ) 10 mg tablet Take 10 mg by mouth once daily      Family History:   Family History  Problem Relation Name Age of Onset   Headaches Mother Fidela Monte    Coronary Artery Disease (Blocked arteries around heart) Mother Fidela Monte    High blood pressure (Hypertension) Mother Fidela Monte    Stroke Mother Fidela Monte    Asthma Mother Fidela Monte    Diabetes Father Unknown    High blood pressure (Hypertension) Father Unknown    Coronary Artery Disease (Blocked arteries around heart) Father Unknown    High blood pressure (Hypertension) Sister Allena Meres    Migraines Brother Antione Powell    High blood pressure (Hypertension) Brother Guadelupe Monte    ADD / ADHD Daughter     ADD / ADHD Son     Coronary Artery Disease (Blocked arteries around heart) Maternal Grandmother [name] Monte    Diabetes Maternal Grandmother [name] Monte    High blood pressure (Hypertension) Maternal Grandmother [name] Monte    Tremor Maternal Grandmother Evagelia Knack    Coronary Artery Disease (Blocked arteries around heart) Maternal Grandfather Unknown    Diabetes Maternal Grandfather Unknown    High blood pressure (Hypertension) Maternal Grandfather Unknown    Anesthesia problems Neg Hx      Social History:   Social History   Socioeconomic History   Marital status: Married  Occupational History   Occupation: Disability   Tobacco Use   Smoking status: Never    Passive exposure: Never   Smokeless tobacco: Never  Vaping Use   Vaping status: Never Used  Substance and Sexual Activity   Alcohol use: Not Currently    Comment: rare, on holidays   Drug use: No   Sexual activity: Yes    Partners: Male    Birth control/protection: I.U.D.  Social History Narrative   Education:HS   Occupation: Nurse, [employer]    Marital Status: married  Social Drivers of Corporate Investment Banker Strain: Low Risk  (08/11/2024)   Overall Financial Resource Strain (CARDIA)    Difficulty of Paying Living Expenses: Not hard at all  Food Insecurity: No Food Insecurity (08/11/2024)   Hunger Vital Sign    Worried About Running Out of Food in the Last Year: Never true    Ran Out of Food in the Last Year: Never true  Transportation Needs: No Transportation Needs (08/11/2024)   PRAPARE - Administrator, Civil Service (Medical): No    Lack of Transportation (Non-Medical): No  Physical Activity: Insufficiently Active (08/01/2024)   Received from [facility]   Exercise Vital Sign    On average, how many days per week do you engage in moderate to strenuous exercise (like a brisk walk)?: 2 days    On average, how many minutes do you engage in exercise at this level?: 20 min  Stress: Stress Concern Present (08/01/2024)   Received from [facility] - Occupational Stress Questionnaire    Do you feel stress - tense, restless, nervous, or anxious, or unable to sleep at night because your mind is troubled all the time - these days?: To some extent  Social Connections: Unknown (08/01/2024)   Received from [facility]   Social Connection and Isolation Panel    In a typical week, how many times do you talk on the phone with family, friends, or neighbors?: Twice a week    How often do you get together with friends or relatives?: Patient declined    How often do  you attend church or religious services?: More than 4 times per year    Do you belong to any clubs or organizations such as church groups, unions, fraternal or athletic groups, or school groups?: No    Are you married, widowed, divorced, separated, never married, or living with a partner?: Widowed  Housing Stability: Low Risk  (08/11/2024)   Housing Stability Vital Sign    Unable to Pay for Housing in the Last Year: No    Number of Times Moved in the Last Year: 0    Homeless in the Last Year: No    Review of Systems:   A 10 point review of systems is negative, except for the pertinent positives and negatives detailed in the HPI.  Vitals:   Vitals:   10/01/24 0921  BP: 102/72  BP Location: Left forearm  Patient Position: Sitting  BP Cuff Size: Adult  Pulse: 64  SpO2: 100%  Weight: (!) 136.5 kg (301 lb)     Body mass index is 55.05 kg/m.  Physical Exam:   Physical Exam Vitals and nursing note reviewed.  Constitutional:      General: in no acute distress.    Appearance: Normal appearance. Is not ill-appearing, toxic-appearing or diaphoretic.  HENT:     Head: Normocephalic and atraumatic.     Right Ear: External ear normal.     Left Ear: External ear normal.  Eyes:     General:        Right eye: No discharge.        Left eye: No discharge.     Extraocular Movements: Extraocular movements intact.     Pupils: Pupils are equal, round, and reactive to light.  Cardiovascular:     Rate and Rhythm: Normal rate and regular rhythm.     Pulses: Normal pulses.     Heart sounds: Normal heart sounds.  No murmur heard.    No friction rub. No gallop.  Abdominal:     General: Bowel sounds are normal.  Skin:    General: Skin is warm and dry.     Capillary Refill: Capillary refill takes less than 2 seconds.  Neurological:     Mental Status: Patient is alert.     Lab and Imaging Results:   Results Diagnostic Pulmonary function test (10/01/2024): Lung volumes 83%  predicted, DLCO 130% predicted; prior study on 03/2024: airflow mid 80s percent predicted, lung volumes 80% predicted, DLCO 112% predicted    Assessment and Plan:   Diagnoses and all orders for this visit:  Moderate persistent asthma, unspecified whether complicated (HHS-HCC) -     Carbon monoxide diffusing capacity (DLCO); Future -     Flow Volume Loop; Future -     Lung Volumes; Future  Chronic cough -     Carbon monoxide diffusing capacity (DLCO); Future -     Flow Volume Loop; Future -     Lung Volumes; Future  OSA on CPAP    Assessment & Plan Moderate persistent asthma Asthma is well-managed with stable lung function. Pulmonary function tests show spirometry in the mid-eighties and lung volumes at 83%. DLCO increased to 130%, indicating potential inflammation. Previous adverse reaction to Dupixent led to discontinuation. Jonell was considered but requires a sleep study for approval. Wheezing occurs during exertion, suggesting possible need for albuterol during episodes. - Continue current asthma management. - Use albuterol inhaler as needed for wheezing episodes. - Await sleep study results to determine Fasenra approval. - Will reassess need for injectable medications after sleep study results and weight loss progress.  Obstructive sleep apnea Managed with CPAP using a nasal pillow interface. She reports effective use without issues. Previous sleep study in January 2023 resulted in CPAP prescription, which she finds beneficial. - Continue CPAP therapy with nasal pillow interface.  Morbid obesity She reports weight loss, which is expected to improve breathing and overall health. Weight loss may reduce the need for asthma medications. - Monitor weight loss progress and its impact on asthma and overall health.    I spent 41 minutes in both face-to-face and non-face-to-face activities including reviewing history, performing an exam and evaluation, entering clinical  information EHR, interpreting results, counseling patient/family/caregiver, reviewing x-rays/CT scans/MRI/labs/echocardiography/PFTs, ordering meds/tests/procedures, referring and communicating with consulting healthcare professionals and care coordination. This does not include time spent with staff.    The patient and/or family voices understanding of the plans. All questions and concerns were answered. The patient and /or family was instructed to call if the patient needs to be seen sooner. Thank you for allowing me to participate in the care of this patient. Do not hesitate to contact me via Epic or by calling our office at [phone] with any questions or concerns.     This note has been created using dictation software tool and any typographical errors are purely unintentional.  Patient received an After Visit Summary     "
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
## 2024-10-05 NOTE — Progress Notes (Signed)
 Heather Jefferson                                          MRN: [id number]   10/05/2024   The [facility] Quality Team Specialist reviewed this patient medical record for the purposes of chart review for care gap closure. The following were reviewed: abstraction for care gap closure-glycemic status assessment.    [facility] Quality Team

## 2024-10-07 ENCOUNTER — Ambulatory Visit (INDEPENDENT_AMBULATORY_CARE_PROVIDER_SITE_OTHER): Admitting: Professional Counselor

## 2024-10-07 ENCOUNTER — Other Ambulatory Visit: Payer: Self-pay | Admitting: Family Medicine

## 2024-10-07 DIAGNOSIS — G4733 Obstructive sleep apnea (adult) (pediatric): Secondary | ICD-10-CM

## 2024-10-07 DIAGNOSIS — F4312 Post-traumatic stress disorder, chronic: Secondary | ICD-10-CM | POA: Diagnosis not present

## 2024-10-07 NOTE — Progress Notes (Signed)
 " THERAPIST PROGRESS NOTE  Session Time: 8:02 AM - 8:42 AM   Participation Level: Active  Behavioral Response: Well Groomed, Alert, Euthymic  Type of Therapy: Individual Therapy  Treatment Goals addressed: Active OP Depression  LTG: I just want to be happy again. I'm just tired of being sad and unfulfilled. Tired of beating myself up over the past. (Progressing)                Start:  11/12/23    Expected End:  11/11/24    Goal Note Reviewed 06/04/24 - I think that this goal has progressed well. I think that due to some of the circumstances that have changed that this will be achieved very shortly.    STG: Dealing with my grief. To move through the stages of grief AEB processing thoughts and feelings in a balanced/healthy way and reporting improvement in mood and daily functioning over the next 12 weeks. (Completed/Met)  Goal Note Reviewed 06/04/24 - I think that my grief has gotten better. I do have moments where I'm overwhelmed but for the most part it has gotten better.    STG: I guess it would be better if I didn't feel so anxious when they (children) were away from me. To reduce symptoms of anxiety AEB utilizing coping mechanisms and restructuring maladaptive patterns of thinking over the next 12 weeks.  (Progressing)  Goal Note Reviewed 06/04/24 - I think as far as me being nervous with the children being gone, I think that has improved. It's not completely gone, but it has improved.  ProgressTowards Goals: Progressing  Interventions: CBT and Supportive  Summary: JAIDAN STACHNIK is a 54 y.o. female who presents with a history of anxiety, depression, and PTSD. She appeared alert and oriented x5. She stated it's been a quiet week. Madeleyn reported she reviewed the power/control module and didn't really identify with any stuck points around this module. She actively listened to esteem and intimacy module. Lis noted stuck points that she identified with. She expressed  understanding to complete challenging beliefs worksheets on these points. Samra scored 63 on PCL screening. She is still scoring high on avoidance, emotional, and physical reactions. She engaged in guided imagery exercise - reparenting. Uldine noted it was very emotional. She reported she may have a hard time practicing these on her own at home. She was receptive to using guided meditations to help.   Therapist Response: Conducted session with [name]. Began session with check-in/update since previous session. Utilized empathetic and reflective listening. Used open-ended questions to facilitate discussion and summarized Arika's thoughts/feelings. Explained esteem and intimacy modules. Assigned challenging beliefs worksheets for homework. Administered PCL screening. Noted high scores on certain symptoms. Engaged Kilee in guided imagery exercise for reparenting. Encouraged Sharie to use guided meditations to help practice these exercises at home. Scheduled additional appointment and concluded session.   Suicidal/Homicidal: No  Plan: Return again in 1 week.  Diagnosis: Chronic post-traumatic stress disorder (PTSD)  Collaboration of Care: Medication Management AEB chart review  Patient/Guardian was advised Release of Information must be obtained prior to any record release in order to collaborate their care with an outside provider. Patient/Guardian was advised if they have not already done so to contact the registration department to sign all necessary forms in order for us  to release information regarding their care.   Consent: Patient/Guardian gives verbal consent for treatment and assignment of benefits for services provided during this visit. Patient/Guardian expressed understanding and agreed to proceed.   Almarie BIRCH  [facility] 10/07/2024  "

## 2024-10-08 NOTE — Telephone Encounter (Signed)
 Requested medication (s) are due for refill today - no  Requested medication (s) are on the active medication list -yes  Future visit scheduled -yes  Last refill: 09/14/24 4ml  Notes to clinic: off protocol- provider review   Requested Prescriptions  Pending Prescriptions Disp Refills   ZEPBOUND  2.5 MG/0.5ML Pen [Pharmacy Med Name: Zepbound  2.5 MG/0.5ML Subcutaneous Solution Auto-injector] 4 mL 0    Sig: INJECT 2.5MG  INTO THE SKIN  ONCE A WEEK     Off-Protocol Failed - 10/08/2024 11:42 AM      Failed - Medication not assigned to a protocol, review manually.      Passed - Valid encounter within last 12 months    Recent Outpatient Visits           2 months ago Interstitial pulmonary disease [facility])   [facility] Glenard Mire, MD   4 months ago Acute cough   [facility] Glenard Mire, MD   4 months ago Viral upper respiratory tract infection   [facility] Bernardo Fend, DO   5 months ago Screening examination for STI   [facility] Glenard Mire, MD   8 months ago Rheumatoid arthritis with positive rheumatoid factor, involving unspecified site [facility])   [facility] Glenard Mire, MD       Future Appointments             In 2 months Sowles, Krichna, MD [facility] [place]               Requested Prescriptions  Pending Prescriptions Disp Refills   ZEPBOUND  2.5 MG/0.5ML Pen [Pharmacy Med Name: Zepbound  2.5 MG/0.5ML Subcutaneous Solution Auto-injector] 4 mL 0    Sig: INJECT 2.5MG  INTO THE SKIN  ONCE A WEEK     Off-Protocol Failed - 10/08/2024 11:42 AM      Failed - Medication not assigned to a protocol, review manually.      Passed - Valid encounter within last 12 months    Recent Outpatient Visits           2 months ago Interstitial pulmonary disease [facility])   [facility] Glenard Mire, MD   4 months ago Acute cough   [facility] Glenard Mire, MD   4 months ago Viral upper respiratory tract infection   [facility] Bernardo Fend, DO   5 months ago Screening examination for STI   [facility] Mire, MD   8 months ago Rheumatoid arthritis with positive rheumatoid factor, involving unspecified site [facility])   [facility] Sowles, Krichna, MD       Future Appointments             In 2 months Glenard, Krichna, MD [facility], [place]

## 2024-10-12 ENCOUNTER — Other Ambulatory Visit: Payer: Self-pay | Admitting: Family Medicine

## 2024-10-12 DIAGNOSIS — G4733 Obstructive sleep apnea (adult) (pediatric): Secondary | ICD-10-CM

## 2024-10-12 MED ORDER — ZEPBOUND 5 MG/0.5ML ~~LOC~~ SOAJ: 5.0000 mg | SUBCUTANEOUS | 0 refills | Status: AC

## 2024-10-13 ENCOUNTER — Other Ambulatory Visit: Payer: Self-pay

## 2024-10-13 ENCOUNTER — Encounter: Payer: Self-pay | Admitting: Psychiatry

## 2024-10-13 ENCOUNTER — Ambulatory Visit (INDEPENDENT_AMBULATORY_CARE_PROVIDER_SITE_OTHER): Admitting: Psychiatry

## 2024-10-13 VITALS — BP 154/94 | HR 68 | Temp 97.4°F | Ht 62.0 in | Wt 298.8 lb

## 2024-10-13 DIAGNOSIS — R632 Polyphagia: Secondary | ICD-10-CM

## 2024-10-13 DIAGNOSIS — F331 Major depressive disorder, recurrent, moderate: Secondary | ICD-10-CM

## 2024-10-13 DIAGNOSIS — G4700 Insomnia, unspecified: Secondary | ICD-10-CM

## 2024-10-13 DIAGNOSIS — F4312 Post-traumatic stress disorder, chronic: Secondary | ICD-10-CM | POA: Diagnosis not present

## 2024-10-13 MED ORDER — TEMAZEPAM 30 MG PO CAPS: 30.0000 mg | ORAL_CAPSULE | Freq: Every evening | ORAL | 0 refills | Status: AC | PRN

## 2024-10-14 ENCOUNTER — Ambulatory Visit (INDEPENDENT_AMBULATORY_CARE_PROVIDER_SITE_OTHER): Admitting: Professional Counselor

## 2024-10-14 DIAGNOSIS — F4321 Adjustment disorder with depressed mood: Secondary | ICD-10-CM | POA: Diagnosis not present

## 2024-10-14 DIAGNOSIS — F4312 Post-traumatic stress disorder, chronic: Secondary | ICD-10-CM | POA: Diagnosis not present

## 2024-10-14 DIAGNOSIS — F331 Major depressive disorder, recurrent, moderate: Secondary | ICD-10-CM

## 2024-10-14 NOTE — Progress Notes (Signed)
" °  THERAPIST PROGRESS NOTE  Session Time: 8:00 AM - 8:50 AM   Participation Level: Active  Behavioral Response: Well Groomed, Alert, Euthymic  Type of Therapy: Individual Therapy  Treatment Goals addressed:  Active OP Depression  LTG: I just want to be happy again. I'm just tired of being sad and unfulfilled. Tired of beating myself up over the past. (Progressing)                Start:  11/12/23    Expected End:  11/11/24    Goal Note Reviewed 06/04/24 - I think that this goal has progressed well. I think that due to some of the circumstances that have changed that this will be achieved very shortly.    STG: Dealing with my grief. To move through the stages of grief AEB processing thoughts and feelings in a balanced/healthy way and reporting improvement in mood and daily functioning over the next 12 weeks. (Completed/Met)  Goal Note Reviewed 06/04/24 - I think that my grief has gotten better. I do have moments where I'm overwhelmed but for the most part it has gotten better.    STG: I guess it would be better if I didn't feel so anxious when they (children) were away from me. To reduce symptoms of anxiety AEB utilizing coping mechanisms and restructuring maladaptive patterns of thinking over the next 12 weeks.  (Progressing)  Goal Note Reviewed 06/04/24 - I think as far as me being nervous with the children being gone, I think that has improved. It's not completely gone, but it has improved.  ProgressTowards Goals: Progressing  Interventions: CBT and Supportive  Summary: Heather Jefferson is a 54 y.o. female who presents with a history of anxiety, depression, and PTSD. She appeared alert and oriented x5. She stated she is doing well this week. She spoke with her oldest daughter and was happy to hear she enjoyed the letters she shared with her. Andrew noted her sister is still mad at her for not attending Christmas with them. Heather Jefferson shared completed challenging beliefs worksheets  on esteem and intimacy. She was receptive to feedback from conservation officer, nature. She expressed understanding about homework assignment for this week.   Therapist Response: Conducted session with Heather Jefferson. Began session with check-in/update since previous session. Utilized empathetic and reflective listening. Used Socratic questioning to help challenge stuck points and provided feedback on challenging beliefs worksheets on esteem and intimacy. Explained homework assignment - final impact statement. Confirmed next appointment and concluded session.   Suicidal/Homicidal: No  Plan: Return again in 1 week.  Diagnosis: Chronic post-traumatic stress disorder (PTSD)  MDD (major depressive disorder), recurrent episode, moderate (HCC)  Grief  Collaboration of Care: Medication Management AEB chart review  Patient/Guardian was advised Release of Information must be obtained prior to any record release in order to collaborate their care with an outside provider. Patient/Guardian was advised if they have not already done so to contact the registration department to sign all necessary forms in order for us  to release information regarding their care.   Consent: Patient/Guardian gives verbal consent for treatment and assignment of benefits for services provided during this visit. Patient/Guardian expressed understanding and agreed to proceed.   Heather Jefferson, [facility] 10/14/2024  "

## 2024-10-22 ENCOUNTER — Ambulatory Visit: Admitting: Professional Counselor

## 2024-10-27 ENCOUNTER — Ambulatory Visit: Admitting: Professional Counselor

## 2024-10-27 DIAGNOSIS — F411 Generalized anxiety disorder: Secondary | ICD-10-CM

## 2024-10-27 DIAGNOSIS — F4312 Post-traumatic stress disorder, chronic: Secondary | ICD-10-CM | POA: Diagnosis not present

## 2024-10-27 NOTE — Progress Notes (Signed)
 " THERAPIST PROGRESS NOTE  Virtual Visit via Video Note  I connected with Heather Jefferson on 10/27/24 at 10:00 AM EST by a video enabled telemedicine application and verified that I am speaking with the correct person using two identifiers.  Location: Patient: Home Provider: Remote office   I discussed the limitations of evaluation and management by telemedicine and the availability of in person appointments. The patient expressed understanding and agreed to proceed.  I discussed the assessment and treatment plan with the patient. The patient was provided an opportunity to ask questions and all were answered. The patient agreed with the plan and demonstrated an understanding of the instructions.   The patient was advised to call back or seek an in-person evaluation if the symptoms worsen or if the condition fails to improve as anticipated.  I provided 50 minutes of non-face-to-face time during this encounter. Heather Jefferson, [facility]  Session Time: 10:01 AM - 10:51 AM   Participation Level: Active  Behavioral Response: Casual, Alert, Anxious  Type of Therapy: Individual Therapy  Treatment Goals addressed: Active OP Depression  LTG: I just want to be happy again. I'm just tired of being sad and unfulfilled. Tired of beating myself up over the past. (Completed/Met)    Start:  11/12/23    Expected End:  11/11/24    Resolved:  10/27/24 Goal Note Reviewed 10/27/24 - I think that has gotten a lot better. I think that was based on what happened with my daughter. I think that goal or that issue has been resolved, as far as me beating myself up.   STG: Dealing with my grief. To move through the stages of grief AEB processing thoughts and feelings in a balanced/healthy way and reporting improvement in mood and daily functioning over the next 12 weeks. (Completed/Met)  Goal Note Reviewed 06/04/24 - I think that my grief has gotten better. I do have moments where I'm overwhelmed but  for the most part it has gotten better.   STG: I guess it would be better if I didn't feel so anxious when they (children) were away from me. To reduce symptoms of anxiety AEB utilizing coping mechanisms and restructuring maladaptive patterns of thinking over the next 12 weeks.  (Progressing)  Goal Note Reviewed 10/27/24 - I think I'm better but I'm worried about when my daughter goes off to school. But that hasn't happened yet so we haven't got there so I'm okay now. Current GAD7 score is 17 (Reduced from 21)   ProgressTowards Goals: Progressing  Interventions: CBT, Motivational Interviewing, and Supportive  Summary: Heather Jefferson is a 54 y.o. female who presents with a history of anxiety, depression, and PTSD. She appeared alert and oriented x5. She stated she is getting cabin fever in this weather. Heather Jefferson reported her sister finally reached out to her. Her sister said she wasn't mad at her but didn't provide a reason why she hadn't been calling her. Heather Jefferson did not write final impact statement but provided a verbal account in session. She scored 39 on PCL screening. She noted progress on goals and areas for continued improvement. She continues to score severe on anxiety screening. Heather Jefferson was in agreement to continue therapy and focus on reducing ongoing PTSD symptoms and anxiety symptoms. She agreed on homework assignment to address these concerns.   Therapist Response: Conducted session with Heather Jefferson. Began session with check-in/update since previous session. Utilized empathetic and reflective listening. Used open-ended questions to facilitate discussion and summarized Brenlyn's thoughts/feelings. Actively listened  as Heather Jefferson provided verbal final impact statement. Administered PCL screening. Reviewed treatment plan with input from [facility] on current strengths, needs, and progress towards goals. Administered GAD7 screening. Discussed plan for ongoing therapy to address PTSD symptoms and anxiety.  Assigned homework to write final impact statement, practice ABC worksheets in response to anxiety-provoking situations, and continue challenging beliefs worksheets on stuck points she is still struggling with. Scheduled additional appointment and concluded session.   Suicidal/Homicidal: No  Plan: Return again in 2 weeks.  Diagnosis: Chronic post-traumatic stress disorder (PTSD)  Anxiety, generalized  Collaboration of Care: Medication Management AEB chart review  Patient/Guardian was advised Release of Information must be obtained prior to any record release in order to collaborate their care with an outside provider. Patient/Guardian was advised if they have not already done so to contact the registration department to sign all necessary forms in order for us  to release information regarding their care.   Consent: Patient/Guardian gives verbal consent for treatment and assignment of benefits for services provided during this visit. Patient/Guardian expressed understanding and agreed to proceed.   Heather Jefferson, [facility] 10/27/2024  "

## 2024-10-28 ENCOUNTER — Ambulatory Visit: Admitting: Professional Counselor

## 2024-11-11 ENCOUNTER — Ambulatory Visit: Admitting: Professional Counselor

## 2024-11-24 ENCOUNTER — Ambulatory Visit: Admitting: Psychiatry

## 2025-01-05 ENCOUNTER — Encounter: Admitting: Family Medicine

## 2025-01-15 ENCOUNTER — Ambulatory Visit

## 2025-01-21 ENCOUNTER — Ambulatory Visit

## 2025-02-03 ENCOUNTER — Ambulatory Visit: Admitting: Family Medicine
